# Patient Record
Sex: Female | Born: 1975 | Race: White | Hispanic: No | Marital: Single | Smoking: Current every day smoker
Health system: Southern US, Community
[De-identification: ages and names within clinical notes are randomized; demographics above are authoritative.]

## PROBLEM LIST (undated history)

## (undated) DIAGNOSIS — G4730 Sleep apnea, unspecified: Secondary | ICD-10-CM

## (undated) DIAGNOSIS — J45909 Unspecified asthma, uncomplicated: Secondary | ICD-10-CM

## (undated) DIAGNOSIS — R519 Headache, unspecified: Secondary | ICD-10-CM

## (undated) DIAGNOSIS — F329 Major depressive disorder, single episode, unspecified: Secondary | ICD-10-CM

## (undated) DIAGNOSIS — F419 Anxiety disorder, unspecified: Secondary | ICD-10-CM

## (undated) DIAGNOSIS — R51 Headache: Secondary | ICD-10-CM

## (undated) DIAGNOSIS — K219 Gastro-esophageal reflux disease without esophagitis: Secondary | ICD-10-CM

## (undated) DIAGNOSIS — M1990 Unspecified osteoarthritis, unspecified site: Secondary | ICD-10-CM

## (undated) DIAGNOSIS — F32A Depression, unspecified: Secondary | ICD-10-CM

## (undated) DIAGNOSIS — Z972 Presence of dental prosthetic device (complete) (partial): Secondary | ICD-10-CM

## (undated) DIAGNOSIS — F319 Bipolar disorder, unspecified: Secondary | ICD-10-CM

## (undated) DIAGNOSIS — F909 Attention-deficit hyperactivity disorder, unspecified type: Secondary | ICD-10-CM

## (undated) DIAGNOSIS — Z87442 Personal history of urinary calculi: Secondary | ICD-10-CM

## (undated) HISTORY — PX: TUBAL LIGATION: SHX77

## (undated) HISTORY — DX: Depression, unspecified: F32.A

## (undated) HISTORY — PX: GALLBLADDER SURGERY: SHX652

## (undated) HISTORY — DX: Major depressive disorder, single episode, unspecified: F32.9

## (undated) HISTORY — DX: Attention-deficit hyperactivity disorder, unspecified type: F90.9

## (undated) HISTORY — DX: Bipolar disorder, unspecified: F31.9

## (undated) HISTORY — PX: CHOLECYSTECTOMY: SHX55

## (undated) HISTORY — DX: Anxiety disorder, unspecified: F41.9

---

## 2004-05-27 ENCOUNTER — Inpatient Hospital Stay: Payer: Self-pay | Admitting: Internal Medicine

## 2005-08-12 ENCOUNTER — Emergency Department: Payer: Self-pay | Admitting: Emergency Medicine

## 2005-10-09 ENCOUNTER — Ambulatory Visit: Payer: Self-pay | Admitting: Family Medicine

## 2005-10-17 ENCOUNTER — Ambulatory Visit: Payer: Self-pay | Admitting: Urology

## 2006-12-16 ENCOUNTER — Emergency Department: Payer: Self-pay | Admitting: Emergency Medicine

## 2007-05-07 ENCOUNTER — Ambulatory Visit: Payer: Self-pay | Admitting: Family Medicine

## 2007-08-02 ENCOUNTER — Emergency Department: Payer: Self-pay | Admitting: Emergency Medicine

## 2007-08-02 ENCOUNTER — Other Ambulatory Visit: Payer: Self-pay

## 2007-08-06 ENCOUNTER — Inpatient Hospital Stay: Payer: Self-pay | Admitting: Surgery

## 2008-03-12 ENCOUNTER — Emergency Department: Payer: Self-pay | Admitting: Internal Medicine

## 2008-06-26 DIAGNOSIS — Z720 Tobacco use: Secondary | ICD-10-CM | POA: Insufficient documentation

## 2008-07-14 DIAGNOSIS — G5600 Carpal tunnel syndrome, unspecified upper limb: Secondary | ICD-10-CM | POA: Insufficient documentation

## 2009-04-25 ENCOUNTER — Ambulatory Visit: Payer: Self-pay | Admitting: Family Medicine

## 2009-05-23 ENCOUNTER — Emergency Department: Payer: Self-pay | Admitting: Emergency Medicine

## 2009-08-14 ENCOUNTER — Ambulatory Visit: Payer: Self-pay | Admitting: Family Medicine

## 2009-10-19 ENCOUNTER — Emergency Department: Payer: Self-pay | Admitting: Emergency Medicine

## 2010-06-26 ENCOUNTER — Emergency Department: Payer: Self-pay | Admitting: Emergency Medicine

## 2011-01-17 ENCOUNTER — Emergency Department: Payer: Self-pay | Admitting: Emergency Medicine

## 2011-01-21 ENCOUNTER — Emergency Department: Payer: Self-pay | Admitting: Emergency Medicine

## 2011-11-26 ENCOUNTER — Ambulatory Visit: Payer: Self-pay | Admitting: Family Medicine

## 2012-01-21 ENCOUNTER — Ambulatory Visit: Payer: Self-pay | Admitting: Family Medicine

## 2012-09-11 ENCOUNTER — Emergency Department: Payer: Self-pay | Admitting: Emergency Medicine

## 2012-09-11 LAB — URINALYSIS, COMPLETE
Bilirubin,UR: NEGATIVE
Glucose,UR: NEGATIVE mg/dL (ref 0–75)
Ketone: NEGATIVE
Nitrite: NEGATIVE
Ph: 5 (ref 4.5–8.0)
Protein: NEGATIVE
RBC,UR: 14 /HPF (ref 0–5)
Specific Gravity: 1.018 (ref 1.003–1.030)
Squamous Epithelial: 1
WBC UR: 18 /HPF (ref 0–5)

## 2012-09-11 LAB — CK TOTAL AND CKMB (NOT AT ARMC)
CK, Total: 117 U/L (ref 21–215)
CK-MB: <0.5 ng/mL — ABNORMAL LOW (ref 0.5–3.6)

## 2012-09-11 LAB — CBC
HCT: 43.6 % (ref 35.0–47.0)
HGB: 14.3 g/dL (ref 12.0–16.0)
MCH: 27.6 pg (ref 26.0–34.0)
MCHC: 32.8 g/dL (ref 32.0–36.0)
MCV: 84 fL (ref 80–100)
Platelet: 207 10*3/uL (ref 150–440)
RBC: 5.18 10*6/uL (ref 3.80–5.20)
RDW: 13.4 % (ref 11.5–14.5)
WBC: 16.7 10*3/uL — ABNORMAL HIGH (ref 3.6–11.0)

## 2012-09-11 LAB — BASIC METABOLIC PANEL
Anion Gap: 10 (ref 7–16)
BUN: 9 mg/dL (ref 7–18)
Calcium, Total: 9 mg/dL (ref 8.5–10.1)
Chloride: 103 mmol/L (ref 98–107)
Co2: 24 mmol/L (ref 21–32)
Creatinine: 1 mg/dL (ref 0.60–1.30)
EGFR (African American): >60
EGFR (Non-African Amer.): >60
Glucose: 112 mg/dL — ABNORMAL HIGH (ref 65–99)
Osmolality: 273 (ref 275–301)
Potassium: 4 mmol/L (ref 3.5–5.1)
Sodium: 137 mmol/L (ref 136–145)

## 2012-09-11 LAB — WET PREP, GENITAL

## 2012-09-11 LAB — TROPONIN I: Troponin-I: <0.02 ng/mL

## 2012-09-11 LAB — GC/CHLAMYDIA PROBE AMP

## 2012-09-11 LAB — PREGNANCY, URINE: Pregnancy Test, Urine: NEGATIVE m[IU]/mL

## 2012-09-13 LAB — URINE CULTURE

## 2013-05-28 ENCOUNTER — Emergency Department: Payer: Self-pay | Admitting: Emergency Medicine

## 2013-05-28 LAB — COMPREHENSIVE METABOLIC PANEL
Albumin: 3.1 g/dL — ABNORMAL LOW (ref 3.4–5.0)
Alkaline Phosphatase: 68 U/L
Anion Gap: 4 — ABNORMAL LOW (ref 7–16)
BUN: 12 mg/dL (ref 7–18)
Bilirubin,Total: 0.6 mg/dL (ref 0.2–1.0)
Calcium, Total: 8.7 mg/dL (ref 8.5–10.1)
Chloride: 106 mmol/L (ref 98–107)
Co2: 28 mmol/L (ref 21–32)
Creatinine: 1.06 mg/dL (ref 0.60–1.30)
EGFR (African American): >60
EGFR (Non-African Amer.): >60
Glucose: 93 mg/dL (ref 65–99)
Osmolality: 275 (ref 275–301)
Potassium: 3.8 mmol/L (ref 3.5–5.1)
SGOT(AST): 13 U/L — ABNORMAL LOW (ref 15–37)
SGPT (ALT): 16 U/L (ref 12–78)
Sodium: 138 mmol/L (ref 136–145)
Total Protein: 6.5 g/dL (ref 6.4–8.2)

## 2013-05-28 LAB — CK TOTAL AND CKMB (NOT AT ARMC)
CK, Total: 82 U/L
CK-MB: 0.6 ng/mL (ref 0.5–3.6)

## 2013-05-28 LAB — CBC
HCT: 40.6 % (ref 35.0–47.0)
HGB: 13.5 g/dL (ref 12.0–16.0)
MCH: 28.4 pg (ref 26.0–34.0)
MCHC: 33.2 g/dL (ref 32.0–36.0)
MCV: 86 fL (ref 80–100)
Platelet: 180 10*3/uL (ref 150–440)
RBC: 4.73 10*6/uL (ref 3.80–5.20)
RDW: 13.9 % (ref 11.5–14.5)
WBC: 12.4 10*3/uL — ABNORMAL HIGH (ref 3.6–11.0)

## 2013-05-28 LAB — URINALYSIS, COMPLETE
Bilirubin,UR: NEGATIVE
Glucose,UR: NEGATIVE mg/dL (ref 0–75)
Ketone: NEGATIVE
Nitrite: POSITIVE
Ph: 6 (ref 4.5–8.0)
Protein: NEGATIVE
RBC,UR: 8 /HPF (ref 0–5)
Specific Gravity: 1.015 (ref 1.003–1.030)
Squamous Epithelial: 1
WBC UR: 14 /HPF (ref 0–5)

## 2013-05-28 LAB — TROPONIN I
Troponin-I: <0.02 ng/mL
Troponin-I: <0.02 ng/mL

## 2013-05-28 LAB — D-DIMER(ARMC): D-Dimer: 964 ng/ml

## 2013-10-25 DIAGNOSIS — E785 Hyperlipidemia, unspecified: Secondary | ICD-10-CM | POA: Insufficient documentation

## 2013-10-25 DIAGNOSIS — R079 Chest pain, unspecified: Secondary | ICD-10-CM | POA: Insufficient documentation

## 2014-03-01 ENCOUNTER — Ambulatory Visit: Payer: Self-pay

## 2014-08-23 ENCOUNTER — Encounter: Payer: Self-pay | Admitting: Psychiatry

## 2014-08-23 ENCOUNTER — Ambulatory Visit (INDEPENDENT_AMBULATORY_CARE_PROVIDER_SITE_OTHER): Payer: Medicaid Other | Admitting: Psychiatry

## 2014-08-23 VITALS — BP 96/61 | HR 76 | Resp 16

## 2014-08-23 DIAGNOSIS — F411 Generalized anxiety disorder: Secondary | ICD-10-CM | POA: Diagnosis not present

## 2014-08-23 DIAGNOSIS — F3160 Bipolar disorder, current episode mixed, unspecified: Secondary | ICD-10-CM

## 2014-08-23 MED ORDER — FLUOXETINE HCL 40 MG PO CAPS: 40.0000 mg | ORAL_CAPSULE | Freq: Every day | ORAL | Status: DC

## 2014-08-23 MED ORDER — LITHIUM CARBONATE ER 300 MG PO TBCR: 300.0000 mg | EXTENDED_RELEASE_TABLET | Freq: Two times a day (BID) | ORAL | Status: DC

## 2014-08-23 MED ORDER — CLONAZEPAM 0.5 MG PO TABS: 0.5000 mg | ORAL_TABLET | Freq: Two times a day (BID) | ORAL | Status: DC | PRN
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
## 2014-08-28 ENCOUNTER — Ambulatory Visit (INDEPENDENT_AMBULATORY_CARE_PROVIDER_SITE_OTHER): Payer: Medicaid Other | Admitting: Family Medicine

## 2014-08-28 ENCOUNTER — Encounter: Payer: Self-pay | Admitting: Family Medicine

## 2014-08-28 VITALS — BP 110/60 | HR 72 | Temp 97.4°F | Resp 16 | Wt 253.6 lb

## 2014-08-28 DIAGNOSIS — M7712 Lateral epicondylitis, left elbow: Secondary | ICD-10-CM

## 2014-08-28 DIAGNOSIS — M722 Plantar fascial fibromatosis: Secondary | ICD-10-CM | POA: Diagnosis not present

## 2014-08-28 MED ORDER — MELOXICAM 15 MG PO TABS: 15.0000 mg | ORAL_TABLET | Freq: Every day | ORAL | Status: DC

## 2014-08-28 NOTE — Patient Instructions (Signed)
We will send you to a foot doctor after you have seen the orthopedic doctor for your elbow,

## 2014-08-28 NOTE — Progress Notes (Signed)
Subjective:     Patient ID: Ann Morales, female   DOB: 1976/02/20, 39 y.o.   MRN: [id number]  HPI  Chief Complaint  Patient presents with  . Foot Swelling  . 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 Feet swell during the day but has persistent foot pain over the last several moths with weight bearing over her heels. Right lateral elbow pain started a month ago but worsened in the last two weeks. Now radiates up her arm. She is right handed with no specific injury reported.   Review of Systems  Musculoskeletal:       Reports she was wearing sneakers at work but switched to padded flip-flops for more support.        Objective:   Physical Exam  Musculoskeletal: She exhibits tenderness (over bilateral plantar heels/ right lateral epicondyle to distal upper arm.).       Assessment:     1. Epicondylitis, lateral (tennis elbow), left  - Ambulatory referral to Orthopedic Surgery - meloxicam (MOBIC) 15 MG tablet; Take 1 tablet (15 mg total) by mouth daily.  Dispense: 30 tablet; Refill: 0  Bilateral plantar fasciitis  - meloxicam (MOBIC) 15 MG tablet; Take 1 tablet (15 mg total) by mouth daily.  Dispense: 30 tablet; Refill: 0    Plan:    Podiatry referral later if feet not improved.

## 2014-08-31 ENCOUNTER — Ambulatory Visit: Payer: Self-pay | Admitting: Psychiatry

## 2014-09-25 ENCOUNTER — Ambulatory Visit (INDEPENDENT_AMBULATORY_CARE_PROVIDER_SITE_OTHER): Payer: Medicaid Other | Admitting: Psychiatry

## 2014-09-25 ENCOUNTER — Encounter: Payer: Self-pay | Admitting: Psychiatry

## 2014-09-25 VITALS — BP 124/84 | HR 82 | Temp 97.3°F | Ht 64.0 in | Wt 250.6 lb

## 2014-09-25 DIAGNOSIS — F3160 Bipolar disorder, current episode mixed, unspecified: Secondary | ICD-10-CM | POA: Diagnosis not present

## 2014-09-25 DIAGNOSIS — F411 Generalized anxiety disorder: Secondary | ICD-10-CM

## 2014-09-25 MED ORDER — FLUOXETINE HCL 40 MG PO CAPS: 40.0000 mg | ORAL_CAPSULE | Freq: Every day | ORAL | Status: DC

## 2014-09-25 MED ORDER — LITHIUM CARBONATE ER 300 MG PO TBCR: 600.0000 mg | EXTENDED_RELEASE_TABLET | ORAL | Status: DC

## 2014-09-25 MED ORDER — CLONAZEPAM 0.5 MG PO TABS: 0.5000 mg | ORAL_TABLET | Freq: Two times a day (BID) | ORAL | Status: DC | PRN

## 2014-09-25 NOTE — Progress Notes (Signed)
BH MD/PA/NP OP Progress Note  09/25/2014 8:56 AM Ann Morales  MRN:  [id number]  Subjective:  Patient returns for follow-up of her bipolar disorder. She reports that he feels her medications have helped her mood and keep it stable. She states she's sleeping about 5-5-1/2 hours a night. She states the focus in her life right now is working. She states she and her fianc both work for the same company and ideally they're trying to save to be able to move. She denies any side effects or medications. When asked if she is enjoying anything she states they're largely spending a lot of time working because the company they work for is short staffed at the moment. As such she states they have not been spending much time doing any recreational activities.  She indicated she is misplaced the laboratory slip that I gave her to monitor lithium level. She stated that it's difficult for her to get to a lab during the day as she works till 5 or 6. I did call the outpatient lab services at this facility and they're open to 8:30 PM. I communicated that the patient and the need to get these laboratory studies done. Chief Complaint:  Visit Diagnosis:  No diagnosis found.  Past Medical History:  Past Medical History  Diagnosis Date  . Anxiety   . Depression   . Bipolar disorder   . ADHD (attention deficit hyperactivity disorder)     Past Surgical History  Procedure Laterality Date  . Gallbladder surgery    . Tubal ligation     Family History:  Family History  Problem Relation Age of Onset  . Hypercholesterolemia Father   . Hypercholesterolemia Maternal Grandmother   . Hypertension Maternal Grandmother   . Anxiety disorder Maternal Grandmother   . Depression Maternal Grandmother    Social History:  History   Social History  . Marital Status: Single    Spouse Name: N/A  . Number of Children: N/A  . Years of Education: N/A   Social History Main Topics  . Smoking status: Current Every Day  Smoker -- 2.00 packs/day for 15 years    Types: Cigarettes    Start date: 09/25/1994  . Smokeless tobacco: Never Used  . Alcohol Use: No  . Drug Use: No  . Sexual Activity: Yes    Birth Control/ Protection: None   Other Topics Concern  . None   Social History Narrative   Additional History:   Assessment:   Musculoskeletal: Strength & Muscle Tone: within normal limits Gait & Station: normal Patient leans: N/A  Psychiatric Specialty Exam: HPI  Review of Systems  Psychiatric/Behavioral: Negative for depression, suicidal ideas, hallucinations, memory loss and substance abuse. The patient is not nervous/anxious and does not have insomnia.     Blood pressure 124/84, pulse 82, temperature 97.3 F (36.3 C), temperature source Tympanic, height 5\' 4"  (1.626 m), weight 250 lb 9.6 oz (113.671 kg), last menstrual period 09/15/2014, SpO2 95 %.Body mass index is 42.99 kg/(m^2).  General Appearance: Well Groomed  Eye Contact:  Good  Speech:  Normal Rate  Volume:  Normal  Mood:  Good  Affect:  Congruent  Thought Process:  Linear and Logical  Orientation:  Full (Time, Place, and Person)  Thought Content:  Negative  Suicidal Thoughts:  No  Homicidal Thoughts:  No  Memory:  Immediate;   Good Recent;   Good Remote;   Good  Judgement:  Good  Insight:  Good  Psychomotor Activity:  Negative  Concentration:  Good  Recall:  Good  Fund of Knowledge: Good  Language: Good  Akathisia:  Negative  Handed:  Right  AIMS (if indicated): N/A  Assets:  Desire for Improvement Social Support Vocational/Educational  ADL's:  Intact  Cognition: WNL  Sleep: fair   Is the patient at risk to self?  No. Has the patient been a risk to self in the past 6 months?  No. Has the patient been a risk to self within the distant past?  No. Is the patient a risk to others?  No. Has the patient been a risk to others in the past 6 months?  No. Has the patient been a risk to others within the distant past?   No.  Current Medications: Current Outpatient Prescriptions  Medication Sig Dispense Refill  . clonazePAM (KLONOPIN) 0.5 MG tablet Take 1 tablet (0.5 mg total) by mouth 2 (two) times daily as needed for anxiety. 60 tablet 1  . FLUoxetine (PROZAC) 40 MG capsule Take 1 capsule (40 mg total) by mouth daily. 30 capsule 1  . fluticasone (FLONASE) 50 MCG/ACT nasal spray SPRAY TWICE IN EACH NOSTRIL EVERY DAY  6  . lithium carbonate (LITHOBID) 300 MG CR tablet Take 1 tablet (300 mg total) by mouth 2 (two) times daily. 60 tablet 1  . loratadine (CLARITIN) 10 MG tablet Take 10 mg by mouth daily.  12  . meloxicam (MOBIC) 15 MG tablet Take 1 tablet (15 mg total) by mouth daily. 30 tablet 0  . predniSONE (STERAPRED UNI-PAK 48 TAB) 10 MG (48) TBPK tablet See admin instructions.  0   No current facility-administered medications for this visit.    Medical Decision Making:  Established Problem, Stable/Improving (1), Review or order medicine tests (1) and Review of Medication Regimen & Side Effects (2)  Treatment Plan Summary:Medication management and Plan The patient reports stability on the current regimen. Thus we'll continue her with the clonazepam to 0.5 mg twice daily, fluoxetine 40 mg daily and lithium carbonate which was prescribed 200 mg twice a day however patient said she's taking 600 mg in the morning. Have encouraged patient have laboratory studies and made her aware of the issues that we need to monitor such as renal function and thyroid function. She'll follow up in 6 weeks. I've encouraged patient to obtain labs as soon as possible. will order 30 day supply of her medications with one refill.   [name] 09/25/2014, 8:56 AM

## 2014-11-06 ENCOUNTER — Ambulatory Visit: Payer: Medicaid Other | Admitting: Psychiatry

## 2015-01-02 ENCOUNTER — Other Ambulatory Visit: Payer: Self-pay

## 2015-01-02 ENCOUNTER — Encounter: Payer: Self-pay | Admitting: Family Medicine

## 2015-01-02 ENCOUNTER — Ambulatory Visit (INDEPENDENT_AMBULATORY_CARE_PROVIDER_SITE_OTHER): Payer: Medicaid Other | Admitting: Family Medicine

## 2015-01-02 VITALS — BP 102/64 | HR 74 | Temp 98.5°F | Resp 18 | Wt 264.6 lb

## 2015-01-02 DIAGNOSIS — R062 Wheezing: Secondary | ICD-10-CM

## 2015-01-02 DIAGNOSIS — J309 Allergic rhinitis, unspecified: Secondary | ICD-10-CM

## 2015-01-02 DIAGNOSIS — J069 Acute upper respiratory infection, unspecified: Secondary | ICD-10-CM | POA: Diagnosis not present

## 2015-01-02 MED ORDER — ALBUTEROL SULFATE HFA 108 (90 BASE) MCG/ACT IN AERS: 2.0000 | INHALATION_SPRAY | Freq: Four times a day (QID) | RESPIRATORY_TRACT | Status: DC | PRN

## 2015-01-02 MED ORDER — BENZONATATE 200 MG PO CAPS: 200.0000 mg | ORAL_CAPSULE | Freq: Two times a day (BID) | ORAL | Status: DC | PRN

## 2015-01-02 NOTE — Patient Instructions (Signed)
Upper Respiratory Infection, Adult Most upper respiratory infections (URIs) are a viral infection of the air passages leading to the lungs. A URI affects the nose, throat, and upper air passages. The most common type of URI is nasopharyngitis and is typically referred to as "the common cold." URIs run their course and usually go away on their own. Most of the time, a URI does not require medical attention, but sometimes a bacterial infection in the upper airways can follow a viral infection. This is called a secondary infection. Sinus and middle ear infections are common types of secondary upper respiratory infections. Bacterial pneumonia can also complicate a URI. A URI can worsen asthma and chronic obstructive pulmonary disease (COPD). Sometimes, these complications can require emergency medical care and may be life threatening.  CAUSES Almost all URIs are caused by viruses. A virus is a type of germ and can spread from one person to another.  RISKS FACTORS You may be at risk for a URI if:   You smoke.   You have chronic heart or lung disease.  You have a weakened defense (immune) system.   You are very young or very old.   You have nasal allergies or asthma.  You work in crowded or poorly ventilated areas.  You work in health care facilities or schools. SIGNS AND SYMPTOMS  Symptoms typically develop 2-3 days after you come in contact with a cold virus. Most viral URIs last 7-10 days. However, viral URIs from the influenza virus (flu virus) can last 14-18 days and are typically more severe. Symptoms may include:   Runny or stuffy (congested) nose.   Sneezing.   Cough.   Sore throat.   Headache.   Fatigue.   Fever.   Loss of appetite.   Pain in your forehead, behind your eyes, and over your cheekbones (sinus pain).  Muscle aches.  DIAGNOSIS  Your health care provider may diagnose a URI by:  Physical exam.  Tests to check that your symptoms are not due to  another condition such as:  Strep throat.  Sinusitis.  Pneumonia.  Asthma. TREATMENT  A URI goes away on its own with time. It cannot be cured with medicines, but medicines may be prescribed or recommended to relieve symptoms. Medicines may help:  Reduce your fever.  Reduce your cough.  Relieve nasal congestion. HOME CARE INSTRUCTIONS   Take medicines only as directed by your health care provider.   Gargle warm saltwater or take cough drops to comfort your throat as directed by your health care provider.  Use a warm mist humidifier or inhale steam from a shower to increase air moisture. This may make it easier to breathe.  Drink enough fluid to keep your urine clear or pale yellow.   Eat soups and other clear broths and maintain good nutrition.   Rest as needed.   Return to work when your temperature has returned to normal or as your health care provider advises. You may need to stay home longer to avoid infecting others. You can also use a face mask and careful hand washing to prevent spread of the virus.  Increase the usage of your inhaler if you have asthma.   Do not use any tobacco products, including cigarettes, chewing tobacco, or electronic cigarettes. If you need help quitting, ask your health care provider. PREVENTION  The best way to protect yourself from getting a cold is to practice good hygiene.   Avoid oral or hand contact with people with cold   symptoms.   Wash your hands often if contact occurs.  There is no clear evidence that vitamin C, vitamin E, echinacea, or exercise reduces the chance of developing a cold. However, it is always recommended to get plenty of rest, exercise, and practice good nutrition.  SEEK MEDICAL CARE IF:   You are getting worse rather than better.   Your symptoms are not controlled by medicine.   You have chills.  You have worsening shortness of breath.  You have brown or red mucus.  You have yellow or brown nasal  discharge.  You have pain in your face, especially when you bend forward.  You have a fever.  You have swollen neck glands.  You have pain while swallowing.  You have white areas in the back of your throat. SEEK IMMEDIATE MEDICAL CARE IF:   You have severe or persistent:  Headache.  Ear pain.  Sinus pain.  Chest pain.  You have chronic lung disease and any of the following:  Wheezing.  Prolonged cough.  Coughing up blood.  A change in your usual mucus.  You have a stiff neck.  You have changes in your:  Vision.  Hearing.  Thinking.  Mood. MAKE SURE YOU:   Understand these instructions.  Will watch your condition.  Will get help right away if you are not doing well or get worse.   This information is not intended to replace advice given to you by your health care provider. Make sure you discuss any questions you have with your health care provider.   Document Released: 08/20/2000 Document Revised: 07/11/2014 Document Reviewed: 06/01/2013 Elsevier Interactive Patient Education 2016 [employer]  

## 2015-01-02 NOTE — Progress Notes (Signed)
Patient ID: Ann Morales, female   DOB: 1975-10-08, 39 y.o.   MRN: [id number] Name: Ann Morales   MRN: [id number]    DOB: Feb 11, 1976   Date:01/02/2015       Progress Note  Subjective  Chief Complaint  Chief Complaint  Patient presents with  . URI    URI  This is a new problem. The current episode started 1 to 4 weeks ago. The problem has been gradually worsening. Associated symptoms include chest pain, congestion, coughing, rhinorrhea and a sore throat. Associated symptoms comments: Itchy nose at onset 2 weeks ago. Now having a light yellow sputum with wheeze at night.. She has tried decongestant and antihistamine (Still using Flonase and Claritin) for the symptoms. The treatment provided no relief.   Past Surgical History  Procedure Laterality Date  . Gallbladder surgery    . Tubal ligation     Family History  Problem Relation Age of Onset  . Hypercholesterolemia Father   . Hypercholesterolemia Maternal Grandmother   . Hypertension Maternal Grandmother   . Anxiety disorder Maternal Grandmother   . Depression Maternal Grandmother    Past Medical History  Diagnosis Date  . Anxiety   . Depression   . Bipolar disorder ([place])   . ADHD (attention deficit hyperactivity disorder)    Social History  Substance Use Topics  . Smoking status: Current Every Day Smoker -- 2.00 packs/day for 15 years    Types: Cigarettes    Start date: 09/25/1994  . Smokeless tobacco: Never Used  . Alcohol Use: No    Current outpatient prescriptions:  .  clonazePAM (KLONOPIN) 0.5 MG tablet, Take 1 tablet (0.5 mg total) by mouth 2 (two) times daily as needed for anxiety., Disp: 60 tablet, Rfl: 1 .  FLUoxetine (PROZAC) 40 MG capsule, Take 1 capsule (40 mg total) by mouth daily., Disp: 30 capsule, Rfl: 1 .  fluticasone (FLONASE) 50 MCG/ACT nasal spray, SPRAY TWICE IN EACH NOSTRIL EVERY DAY, Disp: , Rfl: 6 .  lithium carbonate (LITHOBID) 300 MG CR tablet, Take 2 tablets (600 mg total) by mouth  every morning., Disp: 60 tablet, Rfl: 1 .  loratadine (CLARITIN) 10 MG tablet, Take 10 mg by mouth daily., Disp: , Rfl: 12 .  meloxicam (MOBIC) 15 MG tablet, Take 1 tablet (15 mg total) by mouth daily., Disp: 30 tablet, Rfl: 0 .  omeprazole (PRILOSEC) 20 MG capsule, Take 20 mg by mouth daily., Disp: , Rfl: 0  Allergies  Allergen Reactions  . Codeine Hives and Shortness Of Breath  . Sulfa Antibiotics Other (See Comments)    unknown    Review of Systems  Constitutional: Negative.   HENT: Positive for congestion, rhinorrhea and sore throat.   Eyes: Negative.   Respiratory: Positive for cough.   Cardiovascular: Positive for chest pain.  Gastrointestinal: Negative.   Genitourinary: Negative.   Musculoskeletal: Negative.   Skin: Negative.   Neurological: Negative.   Endo/Heme/Allergies: Negative.   Psychiatric/Behavioral: Negative.    Objective  Filed Vitals:   01/02/15 1540  BP: 102/64  Pulse: 74  Temp: 98.5 F (36.9 C)  TempSrc: Oral  Resp: 18  Weight: 264 lb 9.6 oz (120.022 kg)  SpO2: 97%   Physical Exam  Constitutional: She is oriented to person, place, and time and well-developed, well-nourished, and in no distress.  HENT:  Head: Normocephalic and atraumatic.  Right Ear: External ear normal.  Left Ear: External ear normal.  Nose: Nose normal.  Slightly cobblestone appearance to posterior pharynx. No  exudates. Very good transillumination of all sinuses.  Eyes: Conjunctivae and EOM are normal.  Neck: Normal range of motion. Neck supple.  Cardiovascular: Normal rate, regular rhythm and normal heart sounds.   Pulmonary/Chest: Effort normal. No respiratory distress. She has wheezes. She has no rales. She exhibits no tenderness.  Abdominal: Soft. Bowel sounds are normal.  Lymphadenopathy:    She has no cervical adenopathy.  Neurological: She is alert and oriented to person, place, and time.  Skin: No rash noted.   Assessment & Plan  1. Upper respiratory  infection Onset with allergic rhinitis 2 weeks ago. No fever documented but has felt some chills. No significant help with cough by using the Mucinex-DM. Will give Tessalon and increase fluid intake. Suspect viral illness. May use Tylenol or Advil prn. Recheck if no better in 5 days. - benzonatate (TESSALON) 200 MG capsule; Take 1 capsule (200 mg total) by mouth 2 (two) times daily as needed for cough.  Dispense: 20 capsule; Refill: 0  2. Wheeze Some early inspiratory wheeze. Clears with continued deep breath. No rales or rhonchi. Will give Albuterol inhaler to use prn rescue. Recheck prn. - albuterol (PROVENTIL HFA;VENTOLIN HFA) 108 (90 BASE) MCG/ACT inhaler; Inhale 2 puffs into the lungs every 6 (six) hours as needed for wheezing or shortness of breath.  Dispense: 1 Inhaler; Refill: 1  3. Allergic rhinitis, unspecified allergic rhinitis type Continue Claritin and Flonase regularly for itchy nose and rhinorrhea. Recheck prn.

## 2015-01-08 ENCOUNTER — Encounter: Payer: Self-pay | Admitting: Family Medicine

## 2015-01-15 ENCOUNTER — Encounter: Payer: Self-pay | Admitting: Family Medicine

## 2015-01-15 ENCOUNTER — Ambulatory Visit (INDEPENDENT_AMBULATORY_CARE_PROVIDER_SITE_OTHER): Payer: Medicaid Other | Admitting: Family Medicine

## 2015-01-15 VITALS — BP 112/62 | HR 82 | Temp 98.5°F | Resp 16 | Ht 65.5 in | Wt 267.2 lb

## 2015-01-15 DIAGNOSIS — M940 Chondrocostal junction syndrome [Tietze]: Secondary | ICD-10-CM | POA: Diagnosis not present

## 2015-01-15 DIAGNOSIS — J0100 Acute maxillary sinusitis, unspecified: Secondary | ICD-10-CM

## 2015-01-15 DIAGNOSIS — J45909 Unspecified asthma, uncomplicated: Secondary | ICD-10-CM | POA: Insufficient documentation

## 2015-01-15 DIAGNOSIS — I8393 Asymptomatic varicose veins of bilateral lower extremities: Secondary | ICD-10-CM | POA: Diagnosis not present

## 2015-01-15 DIAGNOSIS — I809 Phlebitis and thrombophlebitis of unspecified site: Secondary | ICD-10-CM | POA: Insufficient documentation

## 2015-01-15 DIAGNOSIS — J309 Allergic rhinitis, unspecified: Secondary | ICD-10-CM | POA: Insufficient documentation

## 2015-01-15 DIAGNOSIS — F32A Depression, unspecified: Secondary | ICD-10-CM | POA: Insufficient documentation

## 2015-01-15 DIAGNOSIS — I8390 Asymptomatic varicose veins of unspecified lower extremity: Secondary | ICD-10-CM | POA: Insufficient documentation

## 2015-01-15 DIAGNOSIS — Z87442 Personal history of urinary calculi: Secondary | ICD-10-CM | POA: Insufficient documentation

## 2015-01-15 DIAGNOSIS — F17200 Nicotine dependence, unspecified, uncomplicated: Secondary | ICD-10-CM | POA: Diagnosis not present

## 2015-01-15 DIAGNOSIS — F329 Major depressive disorder, single episode, unspecified: Secondary | ICD-10-CM | POA: Insufficient documentation

## 2015-01-15 DIAGNOSIS — Z8659 Personal history of other mental and behavioral disorders: Secondary | ICD-10-CM | POA: Insufficient documentation

## 2015-01-15 MED ORDER — PREDNISONE 10 MG PO TABS: ORAL_TABLET | ORAL | Status: DC

## 2015-01-15 MED ORDER — VARENICLINE TARTRATE 0.5 MG X 11 & 1 MG X 42 PO MISC: ORAL | Status: DC

## 2015-01-15 MED ORDER — AMOXICILLIN-POT CLAVULANATE 875-125 MG PO TABS: 1.0000 | ORAL_TABLET | Freq: Two times a day (BID) | ORAL | Status: DC

## 2015-01-15 NOTE — Patient Instructions (Signed)
We will call you about the referral. 

## 2015-01-15 NOTE — Progress Notes (Signed)
Subjective:     Patient ID: Ann Morales, female   DOB: 06-14-1975, 39 y.o.   MRN: [id number]  HPI  Chief Complaint  Patient presents with  . Annual Exam    Patient comes in today for her annual physical, patient would like to address at todays visit cold like symptoms she still having since last being seen in office and to discuss reflux. Patient reports she still has pain in chest at times and feels like the Omperazole is not helping. Patient is due for her annual pap today, her last recorded Tdap 06/12/10 patient has declined flu vaccine today  Ostensibly here for a physical but has multiple medical issues and we mutually elected to treat this as an office visit. Since last visit for a URI  10/25, patient reports increased sinus pressure, purulent sinus drainage, post nasal drainage and accompanying cough. Has persistent chest pain but no burning. Reports being evaluated for this at [facility] a few weeks ago and placed on omeprazole. States burning is gone but continues to have chest pain. Previously quit smoking for 3 months with Chantix and wishes to resume. Last, wishes referral back to vascular surgery for a painful left lower extremity varicose vein.   Review of Systems  Constitutional: Positive for unexpected weight change (14 # weight gain since June. Suggested it is probably related to her psychiatric medication but will assess futher at physical exam in the furture.[name] Kitchen).  Psychiatric/Behavioral:       Continues to be followed by Dr. Jimmye Norman for Bipolar disorder       Objective:   Physical Exam  Constitutional: She appears well-developed and well-nourished. No distress.  Cardiovascular:  Large tender left calf varicose vein  Pulmonary/Chest: She exhibits tenderness (bilateral mdi- costochondral area).  Abdominal: Soft. There is no tenderness.  Ears: T.M's intact without inflammation Sinuses: maxillary sinuses moderately tender Throat: no tonsillar enlargement or exudate Neck:  no cervical adenopathy Lungs: clear     Assessment:    1. Acute maxillary sinusitis, recurrence not specified - amoxicillin-clavulanate (AUGMENTIN) 875-125 MG tablet; Take 1 tablet by mouth 2 (two) times daily.  Dispense: 20 tablet; Refill: 0  2. Costochondritis, acute - predniSONE (DELTASONE) 10 MG tablet; Taper daily as follows: 6 pills, 5, 4, 3, 2, 1  Dispense: 21 tablet; Refill: 0  3. Tobacco use disorder - varenicline (CHANTIX STARTING MONTH PAK) 0.5 MG X 11 & 1 MG X 42 tablet; Take one 0.5 mg tablet by mouth once daily for 3 days, then increase to one 0.5 mg tablet twice daily for 4 days, then increase to one 1 mg tablet twice daily.  Dispense: 53 tablet; Refill: 0  4. Varicose vein of leg - Ambulatory referral to Vascular Surgery    Plan:    Delay Chantix until abx completed.

## 2015-01-22 ENCOUNTER — Telehealth: Payer: Self-pay | Admitting: Family Medicine

## 2015-01-22 ENCOUNTER — Other Ambulatory Visit: Payer: Self-pay | Admitting: Family Medicine

## 2015-01-22 DIAGNOSIS — K219 Gastro-esophageal reflux disease without esophagitis: Secondary | ICD-10-CM

## 2015-01-22 MED ORDER — OMEPRAZOLE 20 MG PO CPDR: 20.0000 mg | DELAYED_RELEASE_CAPSULE | Freq: Every day | ORAL | Status: DC

## 2015-01-22 NOTE — Telephone Encounter (Signed)
refill 

## 2015-01-22 NOTE — Telephone Encounter (Signed)
Pt states she need a refill on her omeprazole (PRILOSEC) 20 MG capsule @ [facility]. CC

## 2015-01-29 ENCOUNTER — Ambulatory Visit: Payer: Self-pay | Admitting: Family Medicine

## 2015-02-10 ENCOUNTER — Other Ambulatory Visit: Payer: Self-pay | Admitting: Family Medicine

## 2015-02-12 NOTE — Telephone Encounter (Signed)
I have previously prescribed the Chantix starter pack one month ago. See if it is the continuation pack she needs now.

## 2015-02-13 NOTE — Telephone Encounter (Signed)
Patient states to disregard refill request, she states that you had told her last office visit to hold off on starting Chantix till she followed back up. Patient states that she is unable to make appt but will be starting the starter pak soon. KW

## 2015-02-19 ENCOUNTER — Encounter: Payer: Self-pay | Admitting: Family Medicine

## 2015-02-19 ENCOUNTER — Ambulatory Visit (INDEPENDENT_AMBULATORY_CARE_PROVIDER_SITE_OTHER): Payer: Medicaid Other | Admitting: Family Medicine

## 2015-02-19 VITALS — BP 90/62 | HR 74 | Temp 98.4°F | Resp 16 | Wt 269.6 lb

## 2015-02-19 DIAGNOSIS — J0100 Acute maxillary sinusitis, unspecified: Secondary | ICD-10-CM

## 2015-02-19 MED ORDER — FLUCONAZOLE 150 MG PO TABS: 150.0000 mg | ORAL_TABLET | Freq: Once | ORAL | Status: DC

## 2015-02-19 MED ORDER — LEVOFLOXACIN 500 MG PO TABS: 500.0000 mg | ORAL_TABLET | Freq: Every day | ORAL | Status: DC

## 2015-02-19 NOTE — Patient Instructions (Signed)
Minimize smoking while ill. Let me know if you don't get better.

## 2015-02-19 NOTE — Progress Notes (Signed)
Subjective:     Patient ID: Ann Morales, female   DOB: 1975-10-21, 39 y.o.   MRN: [id number]  HPI  Chief Complaint  Patient presents with  . Sinus Problem    Patient comes in office today with concerns of sinus congestion/pressure and cough for the past 3 weeks. Patient was last seen in office on 01/15/15, diagnosis was acute macillary sinusitis she was placed on Augementin. Patient reports since last visit symptoms never fully cleared. Today patient has concerns of wheezing, productive cough of sputum, post nasal drip and sinus pressuire below eyes.   Continues to have headaches, sinus pressure, and purulent PND.   Review of Systems  Constitutional: Positive for chills. Negative for fever.       Objective:   Physical Exam  Constitutional: She appears well-developed and well-nourished. No distress.  Ears: T.M's intact without inflammation Sinuses: moderate maxillary sinus tenderness Throat: no tonsillar enlargement or exudate Neck: no cervical adenopathy Lungs: clear     Assessment:    1. Acute maxillary sinusitis, recurrence not specified - levofloxacin (LEVAQUIN) 500 MG tablet; Take 1 tablet (500 mg total) by mouth daily.  Dispense: 10 tablet; Refill: 0 - fluconazole (DIFLUCAN) 150 MG tablet; Take 1 tablet (150 mg total) by mouth once.  Dispense: 1 tablet; Refill: 1    Plan:    Minimize smoking while ill.

## 2015-03-07 ENCOUNTER — Telehealth: Payer: Self-pay | Admitting: Family Medicine

## 2015-03-07 NOTE — Telephone Encounter (Signed)
Pt states she has completed the Rx for sinus congestion and not any better.  Pt is requesting a referral to a specialist.  [phone]

## 2015-03-08 ENCOUNTER — Other Ambulatory Visit: Payer: Self-pay | Admitting: Family Medicine

## 2015-03-08 DIAGNOSIS — J0101 Acute recurrent maxillary sinusitis: Secondary | ICD-10-CM

## 2015-03-08 NOTE — Telephone Encounter (Signed)
Patient advised as directed below. 

## 2015-03-08 NOTE — Telephone Encounter (Signed)
Referral in progress. 

## 2015-03-21 ENCOUNTER — Ambulatory Visit: Payer: Medicaid Other | Admitting: Psychiatry

## 2015-03-26 ENCOUNTER — Encounter: Payer: Self-pay | Admitting: Psychiatry

## 2015-03-26 ENCOUNTER — Ambulatory Visit (INDEPENDENT_AMBULATORY_CARE_PROVIDER_SITE_OTHER): Payer: Medicaid Other | Admitting: Psychiatry

## 2015-03-26 VITALS — BP 118/82 | HR 89 | Temp 97.9°F | Ht 65.5 in | Wt 267.8 lb

## 2015-03-26 DIAGNOSIS — F3160 Bipolar disorder, current episode mixed, unspecified: Secondary | ICD-10-CM

## 2015-03-26 MED ORDER — LITHIUM CARBONATE ER 300 MG PO TBCR: 600.0000 mg | EXTENDED_RELEASE_TABLET | ORAL | Status: DC

## 2015-03-26 MED ORDER — CLONAZEPAM 0.5 MG PO TABS: 0.5000 mg | ORAL_TABLET | Freq: Two times a day (BID) | ORAL | Status: DC | PRN

## 2015-03-26 MED ORDER — FLUOXETINE HCL 20 MG PO CAPS: ORAL_CAPSULE | ORAL | Status: DC

## 2015-03-26 NOTE — Patient Instructions (Signed)
Have lithium level done on 03/30/15 8 to 12 hours after last lithium dose.

## 2015-03-26 NOTE — Progress Notes (Signed)
BH MD/PA/NP OP Progress Note  03/26/2015 11:11 AM Ann Morales  MRN:  [id number]  Subjective:  Patient returns for follow-up of her bipolar disorder. As not been to see me since July 2016. She indicates she did something bad. She states she stopped all of her medications sometime around September 2016 and states that in October she developed a recurrence of her anxiety, panic attacks and mood swings. She is able to state that when she was on medication things were stable.  Chief Complaint: I did a bad thing Chief Complaint    Follow-up     Visit Diagnosis:     ICD-9-CM ICD-10-CM   1. Bipolar affective disorder, current episode mixed, without psychotic features, current episode severity unspecified 296.80 F31.60     Past Medical History:  Past Medical History  Diagnosis Date  . Anxiety   . Depression   . Bipolar disorder ([place])   . ADHD (attention deficit hyperactivity disorder)     Past Surgical History  Procedure Laterality Date  . Gallbladder surgery    . Tubal ligation     Family History:  Family History  Problem Relation Age of Onset  . Hypercholesterolemia Father   . Hypercholesterolemia Maternal Grandmother   . Hypertension Maternal Grandmother   . Anxiety disorder Maternal Grandmother   . Depression Maternal Grandmother   . Bipolar disorder Mother    Social History:  Social History   Social History  . Marital Status: Single    Spouse Name: N/A  . Number of Children: N/A  . Years of Education: N/A   Social History Main Topics  . Smoking status: Current Every Day Smoker -- 2.00 packs/day for 15 years    Types: Cigarettes    Start date: 09/25/1994  . Smokeless tobacco: Never Used  . Alcohol Use: No  . Drug Use: No  . Sexual Activity: Yes    Birth Control/ Protection: None   Other Topics Concern  . None   Social History Narrative   Additional History:   Assessment:   Musculoskeletal: Strength & Muscle Tone: within normal limits Gait &  Station: normal Patient leans: N/A  Psychiatric Specialty Exam: HPI  Review of Systems  Psychiatric/Behavioral: Positive for depression. Negative for suicidal ideas, hallucinations, memory loss and substance abuse. The patient is nervous/anxious. The patient does not have insomnia.   All other systems reviewed and are negative.   Blood pressure 118/82, pulse 89, temperature 97.9 F (36.6 C), temperature source Tympanic, height 5' 5.5" (1.664 m), weight 267 lb 12.8 oz (121.473 kg), last menstrual period 03/02/2015, SpO2 94 %.Body mass index is 43.87 kg/(m^2).  General Appearance: Well Groomed  Eye Contact:  Good  Speech:  Normal Rate  Volume:  Normal  Mood:  Good  Affect:  Congruent  Thought Process:  Linear and Logical  Orientation:  Full (Time, Place, and Person)  Thought Content:  Negative  Suicidal Thoughts:  No  Homicidal Thoughts:  No  Memory:  Immediate;   Good Recent;   Good Remote;   Good  Judgement:  Good  Insight:  Good  Psychomotor Activity:  Negative  Concentration:  Good  Recall:  Good  Fund of Knowledge: Good  Language: Good  Akathisia:  Negative  Handed:  Right  AIMS (if indicated): N/A  Assets:  Desire for Improvement Social Support Vocational/Educational  ADL's:  Intact  Cognition: WNL  Sleep: fair   Is the patient at risk to self?  No. Has the patient been a risk to  self in the past 6 months?  No. Has the patient been a risk to self within the distant past?  No. Is the patient a risk to others?  No. Has the patient been a risk to others in the past 6 months?  No. Has the patient been a risk to others within the distant past?  No.  Current Medications: Current Outpatient Prescriptions  Medication Sig Dispense Refill  . clonazePAM (KLONOPIN) 0.5 MG tablet Take 1 tablet (0.5 mg total) by mouth 2 (two) times daily as needed for anxiety. 60 tablet 0  . FLUoxetine (PROZAC) 20 MG capsule Take one capsule in the morning for seven days then increase to  two capsules in the morning. 60 capsule 0  . lithium carbonate (LITHOBID) 300 MG CR tablet Take 2 tablets (600 mg total) by mouth every morning. 60 tablet 0   No current facility-administered medications for this visit.    Medical Decision Making:  Established Problem, Stable/Improving (1), Review or order medicine tests (1) and Review of Medication Regimen & Side Effects (2)  Treatment Plan Summary:Medication management and Plan The patient to meet her medications and had a resurgence of her symptoms. Thus we'll start her previous medication regimen.   Bipolar disorder-restart her clonazepam to 0.5 mg twice daily as needed for anxiety, restart fluoxetine 20 mg daily 7 days and then she'll increase to 40 mg daily which was her previous dose. Restart  lithium carbonate CR 600 mg in the morning. I have encouraged patient have laboratory studies and made her aware of the issues that we need to monitor such as renal function and thyroid function. Agree to get these labs done on 03/30/2015.  She'll follow up in an month. He is aware my departure from the clinic and that she will follow up with another provider in this clinic. She will see me one more time prior to my departure as we are restarting her medications.   [name] 03/26/2015, 11:11 AM

## 2015-03-30 ENCOUNTER — Ambulatory Visit: Payer: Self-pay | Admitting: Family Medicine

## 2015-04-02 ENCOUNTER — Other Ambulatory Visit: Payer: Self-pay | Admitting: Family Medicine

## 2015-04-02 ENCOUNTER — Ambulatory Visit
Admission: RE | Admit: 2015-04-02 | Discharge: 2015-04-02 | Disposition: A | Discharge status: Home / Self Care | Payer: Medicaid Other | Source: Ambulatory Visit | Attending: Family Medicine | Admitting: Family Medicine

## 2015-04-02 ENCOUNTER — Ambulatory Visit (INDEPENDENT_AMBULATORY_CARE_PROVIDER_SITE_OTHER): Payer: Medicaid Other | Admitting: Family Medicine

## 2015-04-02 ENCOUNTER — Encounter: Payer: Self-pay | Admitting: Family Medicine

## 2015-04-02 VITALS — BP 102/78 | HR 70 | Temp 98.1°F | Resp 16 | Wt 267.2 lb

## 2015-04-02 DIAGNOSIS — M25551 Pain in right hip: Secondary | ICD-10-CM

## 2015-04-02 NOTE — Patient Instructions (Signed)
We will call you with the x-ray report. Take Tylenol extra strength for now until I can evaluate the x-ray.
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
## 2015-04-03 ENCOUNTER — Telehealth: Payer: Self-pay

## 2015-04-03 ENCOUNTER — Other Ambulatory Visit: Payer: Self-pay | Admitting: Family Medicine

## 2015-04-03 DIAGNOSIS — M25551 Pain in right hip: Secondary | ICD-10-CM

## 2015-04-03 NOTE — Telephone Encounter (Signed)
LMTCB-KW 

## 2015-04-03 NOTE — Telephone Encounter (Signed)
LMTCB 04/03/2015  Thanks,   -Mickel Baas

## 2015-04-03 NOTE — Telephone Encounter (Signed)
Pt advised, she would like to see someone at [facility].   Thanks,   -Mickel Baas

## 2015-04-03 NOTE — Telephone Encounter (Signed)
-----   Message from Carmon Ginsberg, [place] sent at 04/03/2015  8:31 AM EST ----- No fractures or arthritic changes. I would like you to see an orthopedic doctor. Do you have a preference?

## 2015-04-03 NOTE — Telephone Encounter (Signed)
Referral in progress. Once prednisone completed, try two Aleve twice daily with food while waiting for referral.

## 2015-04-04 NOTE — Telephone Encounter (Signed)
Patient advised as below. sd 

## 2015-04-16 ENCOUNTER — Ambulatory Visit (INDEPENDENT_AMBULATORY_CARE_PROVIDER_SITE_OTHER): Payer: Medicaid Other | Admitting: Psychiatry

## 2015-04-16 ENCOUNTER — Encounter: Payer: Self-pay | Admitting: Psychiatry

## 2015-04-16 VITALS — BP 124/82 | HR 92 | Temp 98.4°F | Ht 65.5 in | Wt 265.8 lb

## 2015-04-16 DIAGNOSIS — F3160 Bipolar disorder, current episode mixed, unspecified: Secondary | ICD-10-CM

## 2015-04-16 DIAGNOSIS — F411 Generalized anxiety disorder: Secondary | ICD-10-CM | POA: Diagnosis not present

## 2015-04-16 MED ORDER — CLONAZEPAM 0.5 MG PO TABS: 0.5000 mg | ORAL_TABLET | Freq: Two times a day (BID) | ORAL | Status: DC | PRN

## 2015-04-16 MED ORDER — LITHIUM CARBONATE ER 300 MG PO TBCR: 600.0000 mg | EXTENDED_RELEASE_TABLET | ORAL | Status: DC

## 2015-04-16 MED ORDER — FLUOXETINE HCL 20 MG PO CAPS
ORAL_CAPSULE | ORAL | Status: DC
Start: 2015-04-16 — End: 2016-04-21

## 2015-04-16 NOTE — Progress Notes (Signed)
BH MD/PA/NP OP Progress Note  04/16/2015 9:30 AM Ann Morales  MRN:  [id number]  Subjective:  Patient returns for follow-up of her bipolar disorder. Patient return to see me in January 2017 after last being seen in July 2016. In January I restarted her on her previous psychiatric medications that she had discontinued them. We restarted her Prozac and her lithium. She states overall her mood is good however she states over the past few days she's had a lot of anxiety. She states the anxiety is related to having to leave her apartment. Patient states she got a dog and was hoping she could find a place for the dog before her apartment management notice. However she was not able to find a place for the dog and the place asked her to leave because they did not allow pets. Patient states she is in the process of trying to find another place to live. She does state that her mood overall has been good and she's been able to enjoy things like shopping with her relatives. She states her sleep has not been good she has felt hot at night over the past few weeks but thinks she might be going through menopause. She states that her mother went through menopause in her 68s.  Patient brought up that she is going to try to apply for disability. I told her given that I'm about to leave it would probably not be good to initiate that paperwork at this time.  Chief Complaint: Anxiety Chief Complaint    Follow-up; Medication Refill     Visit Diagnosis:     ICD-9-CM ICD-10-CM   1. Bipolar affective disorder, current episode mixed, without psychotic features, current episode severity unspecified 296.80 F31.60   2. Anxiety state 300.00 F41.1     Past Medical History:  Past Medical History  Diagnosis Date  . Anxiety   . Depression   . Bipolar disorder ([place])   . ADHD (attention deficit hyperactivity disorder)     Past Surgical History  Procedure Laterality Date  . Gallbladder surgery    . Tubal ligation      Family History:  Family History  Problem Relation Age of Onset  . Hypercholesterolemia Father   . Hypercholesterolemia Maternal Grandmother   . Hypertension Maternal Grandmother   . Anxiety disorder Maternal Grandmother   . Depression Maternal Grandmother   . Bipolar disorder Mother    Social History:  Social History   Social History  . Marital Status: Single    Spouse Name: N/A  . Number of Children: N/A  . Years of Education: N/A   Social History Main Topics  . Smoking status: Current Every Day Smoker -- 2.00 packs/day for 15 years    Types: Cigarettes    Start date: 09/25/1994  . Smokeless tobacco: Never Used  . Alcohol Use: No  . Drug Use: No  . Sexual Activity: Yes    Birth Control/ Protection: None   Other Topics Concern  . None   Social History Narrative   Additional History:   Assessment:   Musculoskeletal: Strength & Muscle Tone: within normal limits Gait & Station: normal Patient leans: N/A  Psychiatric Specialty Exam: HPI  Review of Systems  Psychiatric/Behavioral: Negative for depression, suicidal ideas, hallucinations, memory loss and substance abuse. The patient is nervous/anxious. The patient does not have insomnia.   All other systems reviewed and are negative.   Blood pressure 124/82, pulse 92, temperature 98.4 F (36.9 C), temperature source Tympanic, height 5'  5.5" (1.664 m), weight 265 lb 12.8 oz (120.566 kg), last menstrual period 04/02/2015, SpO2 95 %.Body mass index is 43.54 kg/(m^2).  General Appearance: Well Groomed  Eye Contact:  Good  Speech:  Normal Rate  Volume:  Normal  Mood:  Anxious  Affect:  Congruent  Thought Process:  Linear and Logical  Orientation:  Full (Time, Place, and Person)  Thought Content:  Negative  Suicidal Thoughts:  No  Homicidal Thoughts:  No  Memory:  Immediate;   Good Recent;   Good Remote;   Good  Judgement:  Good  Insight:  Good  Psychomotor Activity:  Negative  Concentration:  Good  Recall:   Good  Fund of Knowledge: Good  Language: Good  Akathisia:  Negative  Handed:  Right  AIMS (if indicated): N/A  Assets:  Desire for Improvement Social Support Vocational/Educational  ADL's:  Intact  Cognition: WNL  Sleep: fair   Is the patient at risk to self?  No. Has the patient been a risk to self in the past 6 months?  No. Has the patient been a risk to self within the distant past?  No. Is the patient a risk to others?  No. Has the patient been a risk to others in the past 6 months?  No. Has the patient been a risk to others within the distant past?  No.  Current Medications: Current Outpatient Prescriptions  Medication Sig Dispense Refill  . clonazePAM (KLONOPIN) 0.5 MG tablet Take 1 tablet (0.5 mg total) by mouth 2 (two) times daily as needed for anxiety. 60 tablet 3  . FLUoxetine (PROZAC) 20 MG capsule Take one capsule in the morning for seven days then increase to two capsules in the morning. 60 capsule 3  . lithium carbonate (LITHOBID) 300 MG CR tablet Take 2 tablets (600 mg total) by mouth every morning. 60 tablet 3   No current facility-administered medications for this visit.    Medical Decision Making:  Established Problem, Stable/Improving (1), Review or order medicine tests (1) and Review of Medication Regimen & Side Effects (2)  Treatment Plan Summary:Medication management and Plan The patient to meet her medications and had a resurgence of her symptoms. Thus we'll start her previous medication regimen.   Bipolar disorder-restart her clonazepam to 0.5 mg twice daily as needed for anxiety,  fluoxetine  40 mg daily,  lithium carbonate CR 600 mg in the morning. At her last visit I did give her a laboratory slip to have lithium levels and associated labs done. She states she did go to [facility] family practice and have these done sometime around 04/05/2015. I'm having her sign a release so we can get those laboratory studies.   She'll follow up in an month. He is  aware my departure from the clinic and that she will follow up with another provider in this clinic. She has been encouraged call any questions or concerns prior to her next appointment.  [name] 04/16/2015, 9:30 AM

## 2015-05-14 ENCOUNTER — Encounter: Payer: Self-pay | Admitting: Family Medicine

## 2015-05-14 ENCOUNTER — Ambulatory Visit (INDEPENDENT_AMBULATORY_CARE_PROVIDER_SITE_OTHER): Payer: Medicaid Other | Admitting: Family Medicine

## 2015-05-14 VITALS — BP 100/80 | HR 84 | Temp 98.3°F | Resp 16 | Wt 266.8 lb

## 2015-05-14 DIAGNOSIS — K130 Diseases of lips: Secondary | ICD-10-CM | POA: Diagnosis not present

## 2015-05-14 NOTE — Progress Notes (Signed)
Subjective:     Patient ID: Ann Morales, female   DOB: Jun 14, 1975, 40 y.o.   MRN: [id number]  HPI  Chief Complaint  Patient presents with  . Blister    Patient comes in office today with concerns of blister on her lowe lip. Patient reports that blister has been present for the past two weeks, she denies taking any otc medication.   States it started as a blister which she picked and manipulated. Now has dark, hard spot which itches and hurts. No prior hx of cold sores. Accompanied by her daughter today.   Review of Systems     Objective:   Physical Exam  Constitutional: She appears well-developed and well-nourished. No distress.  Skin:  Right lower lip with 0.5 cm dark, indurated, eschar slightly crossing over the lip border to the chin. Attempted cauterization with silver nitrate sticks but product defective with minimal medication on the tip.       Assessment:    1. Lip lesion: ? Residual eschar from lip trauma; ? Pyogenic granuloma.     Plan:    Discussed use of vaseline to soften eschar to allow removal. If not improving to call with ENT referral.

## 2015-05-14 NOTE — Patient Instructions (Signed)
Try vaseline 1-2 x day. Call me if not improving over the course of the lip.

## 2015-05-15 ENCOUNTER — Ambulatory Visit (INDEPENDENT_AMBULATORY_CARE_PROVIDER_SITE_OTHER): Payer: Medicaid Other | Admitting: Psychiatry

## 2015-05-15 ENCOUNTER — Encounter: Payer: Self-pay | Admitting: Psychiatry

## 2015-05-15 DIAGNOSIS — F319 Bipolar disorder, unspecified: Secondary | ICD-10-CM

## 2015-05-15 NOTE — Progress Notes (Signed)
Patient ID: Ann Morales, female   DOB: [birth year], 40 y.o.   MRN: [id number] [facility] MD/PA/NP OP Progress Note  05/15/2015 9:33 AM Kolleen Durrette  MRN:  [id number]  Subjective:  Patient returns for follow-up of her bipolar disorder. Patient was previously seen by Dr. Jimmye Norman and this is the first visit for this patient with this clinician. Patient was restarted on all her medications at her previous visit since she had discontinued her medications the past year. Patient reports being compliant with all her medications. She denies any side effects. States that her mood has been stable. Reports fair sleep and appetite. She states that she seems to overeat but doing okay overall. Patient discussed her disability paperwork and she was told that since this is the first visit for this clinician with patient, we will continue to monitor her health status and determine the need for disability at a future date. She reports that she last worked in 2013 and since then she has been unable to hold a job since she cannot focus. Patient stated that she would obtain her disability paperwork from her primary care physician.  Patient denies any suicidal thoughts. States that she has been doing okay overall.     Chief Complaint: Anxiety Chief Complaint    Follow-up; Medication Refill     Visit Diagnosis:   No diagnosis found.  Past Medical History:  Past Medical History  Diagnosis Date  . Anxiety   . Depression   . Bipolar disorder ([place])   . ADHD (attention deficit hyperactivity disorder)     Past Surgical History  Procedure Laterality Date  . Gallbladder surgery    . Tubal ligation     Family History:  Family History  Problem Relation Age of Onset  . Hypercholesterolemia Father   . Hypercholesterolemia Maternal Grandmother   . Hypertension Maternal Grandmother   . Anxiety disorder Maternal Grandmother   . Depression Maternal Grandmother   . Bipolar disorder Mother    Social History:   Social History   Social History  . Marital Status: Single    Spouse Name: N/A  . Number of Children: N/A  . Years of Education: N/A   Social History Main Topics  . Smoking status: Current Every Day Smoker -- 2.00 packs/day for 15 years    Types: Cigarettes    Start date: 09/25/1994  . Smokeless tobacco: Never Used  . Alcohol Use: No  . Drug Use: No  . Sexual Activity: Yes    Birth Control/ Protection: None   Other Topics Concern  . None   Social History Narrative   Additional History:   Assessment:   Musculoskeletal: Strength & Muscle Tone: within normal limits Gait & Station: normal Patient leans: N/A  Psychiatric Specialty Exam: HPI  Review of Systems  Psychiatric/Behavioral: Negative for depression, suicidal ideas, hallucinations, memory loss and substance abuse. The patient is nervous/anxious. The patient does not have insomnia.   All other systems reviewed and are negative.   Blood pressure 118/80, pulse 82, temperature 98.2 F (36.8 C), temperature source Tympanic, height 5' 5.5" (1.664 m), weight 264 lb 12.8 oz (120.112 kg), last menstrual period 04/17/2015, SpO2 94 %.Body mass index is 43.38 kg/(m^2).  General Appearance: Well Groomed  Eye Contact:  Good  Speech:  Normal Rate  Volume:  Normal  Mood:  Good   Affect:  Congruent  Thought Process:  Linear and Logical  Orientation:  Full (Time, Place, and Person)  Thought Content:  Negative  Suicidal Thoughts:  No  Homicidal Thoughts:  No  Memory:  Immediate;   Good Recent;   Good Remote;   Good  Judgement:  Good  Insight:  Good  Psychomotor Activity:  Negative  Concentration:  Good  Recall:  Good  Fund of Knowledge: Good  Language: Good  Akathisia:  Negative  Handed:  Right  AIMS (if indicated): N/A  Assets:  Desire for Improvement Social Support Vocational/Educational  ADL's:  Intact  Cognition: WNL  Sleep: fair   Is the patient at risk to self?  No. Has the patient been a risk to self in  the past 6 months?  No. Has the patient been a risk to self within the distant past?  No. Is the patient a risk to others?  No. Has the patient been a risk to others in the past 6 months?  No. Has the patient been a risk to others within the distant past?  No.  Current Medications: Current Outpatient Prescriptions  Medication Sig Dispense Refill  . azelastine (ASTELIN) 0.1 % nasal spray SPRAY ONCE IN EACH NOSTRIL TWICE DAILY  12  . clonazePAM (KLONOPIN) 0.5 MG tablet Take 1 tablet (0.5 mg total) by mouth 2 (two) times daily as needed for anxiety. 60 tablet 3  . FLUoxetine (PROZAC) 20 MG capsule Take one capsule in the morning for seven days then increase to two capsules in the morning. 60 capsule 3  . lithium carbonate (LITHOBID) 300 MG CR tablet Take 2 tablets (600 mg total) by mouth every morning. 60 tablet 3  . omeprazole (PRILOSEC) 20 MG capsule Reported on 05/14/2015  2   No current facility-administered medications for this visit.    Medical Decision Making:  Established Problem, Stable/Improving (1), Review or order medicine tests (1) and Review of Medication Regimen & Side Effects (2)  Treatment Plan Summary:Medication management and Plan   Bipolar disorder Decrease Klonopin to 0.5 mg once daily for 2 weeks and then discontinue . Continue fluoxetine at 40 mg daily  Continue lithium carbonate CR 600 mg in the morning. Patient reports that she got her labs done last month so and she did sign a consent so we should have the results.  She'll follow up in 2 month. She has been encouraged to call any questions or concerns prior to her next appointment.  Miyana Mordecai 05/15/2015, 9:33 AM

## 2015-05-18 ENCOUNTER — Other Ambulatory Visit: Payer: Self-pay | Admitting: Family Medicine

## 2015-05-22 ENCOUNTER — Encounter: Payer: Self-pay | Admitting: Family Medicine

## 2015-05-22 ENCOUNTER — Ambulatory Visit (INDEPENDENT_AMBULATORY_CARE_PROVIDER_SITE_OTHER): Payer: Medicaid Other | Admitting: Family Medicine

## 2015-05-22 VITALS — BP 110/90 | HR 81 | Temp 98.0°F | Resp 16 | Ht 67.0 in | Wt 267.2 lb

## 2015-05-22 DIAGNOSIS — M7061 Trochanteric bursitis, right hip: Secondary | ICD-10-CM

## 2015-05-22 NOTE — Progress Notes (Signed)
Subjective:     Patient ID: Ann Morales, female   DOB: [birth year], 40 y.o.   MRN: [id number]  HPI  Chief Complaint  Patient presents with  . Diability Review    Patient comes in office today to have provider review and fill out disability forms, patient reports that she has no questions or concerns today.   States lip lesion resolved with use of vaseline.. States she is not working and has applied for SS disability for Bipolar disorder. She has received disability forms in the mail from [facility] disability. I suggested that she SS has their own examiners to determine disability or her psychiatrist would be the next choice to fill out the forms. She continue to have right trochanteric bursitis despite injection and course of nsaid's per orthopedics. Wishes a second opinion.   Review of Systems     Objective:   Physical Exam  Constitutional: She appears well-developed and well-nourished. No distress.  Musculoskeletal: Tenderness: right trochanteric area.       Assessment:    1. Trochanteric bursitis of right hip - Ambulatory referral to Orthopedic Surgery    Plan:    She is to recheck with SS disability about form completion.

## 2015-05-22 NOTE — Patient Instructions (Signed)
We will call you with the referral.

## 2015-05-28 ENCOUNTER — Other Ambulatory Visit: Payer: Self-pay | Admitting: Orthopedic Surgery

## 2015-05-28 DIAGNOSIS — M545 Low back pain: Secondary | ICD-10-CM

## 2015-06-06 ENCOUNTER — Ambulatory Visit
Admission: RE | Admit: 2015-06-06 | Discharge: 2015-06-06 | Disposition: A | Discharge status: Home / Self Care | Payer: Medicaid Other | Source: Ambulatory Visit | Attending: Orthopedic Surgery | Admitting: Orthopedic Surgery

## 2015-06-06 DIAGNOSIS — M545 Low back pain: Secondary | ICD-10-CM

## 2015-06-16 ENCOUNTER — Other Ambulatory Visit: Payer: Self-pay | Admitting: Family Medicine

## 2015-07-11 ENCOUNTER — Ambulatory Visit (INDEPENDENT_AMBULATORY_CARE_PROVIDER_SITE_OTHER): Payer: Self-pay | Admitting: Psychiatry

## 2015-07-19 ENCOUNTER — Other Ambulatory Visit: Payer: Self-pay | Admitting: Family Medicine

## 2015-07-19 DIAGNOSIS — M25521 Pain in right elbow: Principal | ICD-10-CM

## 2015-07-19 DIAGNOSIS — G8929 Other chronic pain: Secondary | ICD-10-CM

## 2015-10-09 ENCOUNTER — Other Ambulatory Visit: Payer: Self-pay | Admitting: Family Medicine

## 2015-10-09 ENCOUNTER — Telehealth: Payer: Self-pay | Admitting: Family Medicine

## 2015-10-09 DIAGNOSIS — M25551 Pain in right hip: Secondary | ICD-10-CM

## 2015-10-09 NOTE — Telephone Encounter (Signed)
Pt is requesting an additional referral sent to [facility] doctor office so she can continue to be seen for hip pain.  [phone]

## 2015-10-09 NOTE — Telephone Encounter (Signed)
Will refer for ongoing evaluation of right hip pain

## 2015-10-09 NOTE — Telephone Encounter (Signed)
Please advise, Amparo Bristol

## 2015-11-27 ENCOUNTER — Ambulatory Visit (INDEPENDENT_AMBULATORY_CARE_PROVIDER_SITE_OTHER): Payer: Medicaid Other | Admitting: Family Medicine

## 2015-11-27 ENCOUNTER — Encounter: Payer: Self-pay | Admitting: Family Medicine

## 2015-11-27 ENCOUNTER — Other Ambulatory Visit: Payer: Self-pay | Admitting: Family Medicine

## 2015-11-27 VITALS — BP 124/70 | HR 80 | Temp 98.3°F | Resp 16 | Wt 261.0 lb

## 2015-11-27 DIAGNOSIS — N760 Acute vaginitis: Secondary | ICD-10-CM

## 2015-11-27 DIAGNOSIS — J4520 Mild intermittent asthma, uncomplicated: Secondary | ICD-10-CM | POA: Diagnosis not present

## 2015-11-27 DIAGNOSIS — N3090 Cystitis, unspecified without hematuria: Secondary | ICD-10-CM

## 2015-11-27 LAB — POCT URINALYSIS DIPSTICK
Bilirubin, UA: NEGATIVE
Glucose, UA: NEGATIVE
Ketones, UA: NEGATIVE
Leukocytes, UA: NEGATIVE
Nitrite, UA: NEGATIVE
Protein, UA: NEGATIVE
Spec Grav, UA: >=1.03
Urobilinogen, UA: 0.2
pH, UA: 5

## 2015-11-27 MED ORDER — NITROFURANTOIN MONOHYD MACRO 100 MG PO CAPS: 100.0000 mg | ORAL_CAPSULE | Freq: Two times a day (BID) | ORAL | 0 refills | Status: DC

## 2015-11-27 MED ORDER — FLUCONAZOLE 150 MG PO TABS: 150.0000 mg | ORAL_TABLET | Freq: Once | ORAL | 1 refills | Status: AC

## 2015-11-27 MED ORDER — ALBUTEROL SULFATE HFA 108 (90 BASE) MCG/ACT IN AERS: 2.0000 | INHALATION_SPRAY | Freq: Four times a day (QID) | RESPIRATORY_TRACT | 5 refills | Status: DC | PRN

## 2015-11-27 NOTE — Progress Notes (Signed)
Subjective:     Patient ID: Ann Morales, female   DOB: 03/07/1976, 40 y.o.   MRN: [id number]  HPI  Chief Complaint  Patient presents with  . Urinary Tract Infection  States she has noticed urinary burning and frequency over the last few days. Also has had vaginal itching with a creamy appearing discharge. Wishes refill on albuterol inhaler.   Review of Systems  Musculoskeletal:       Reports waking up with a stiff neck. Discussed using pillow in her bed which keeps her head,neck, shoulders, and hips in a straight line.       Objective:   Physical Exam  Constitutional: She appears well-developed and well-nourished. No distress.  Genitourinary:  Genitourinary Comments: No CVA tenderness       Assessment:    1. Cystitis - nitrofurantoin, macrocrystal-monohydrate, (MACROBID) 100 MG capsule; Take 1 capsule (100 mg total) by mouth 2 (two) times daily.  Dispense: 14 capsule; Refill: 0 - POCT urinalysis dipstick - Urine culture  2. Vaginitis - fluconazole (DIFLUCAN) 150 MG tablet; Take 1 tablet (150 mg total) by mouth once.  Dispense: 1 tablet; Refill: 1  3. Asthma, mild intermittent, uncomplicated - albuterol (PROVENTIL HFA;VENTOLIN HFA) 108 (90 Base) MCG/ACT inhaler; Inhale 2 puffs into the lungs every 6 (six) hours as needed for wheezing or shortness of breath.  Dispense: 1 Inhaler; Refill: 5    Plan:   Further f/u pending urine culture.

## 2015-11-27 NOTE — Patient Instructions (Signed)
We will call you with the culture results 

## 2015-11-29 LAB — URINE CULTURE

## 2015-11-30 ENCOUNTER — Telehealth: Payer: Self-pay

## 2015-11-30 ENCOUNTER — Telehealth: Payer: Self-pay | Admitting: Family Medicine

## 2015-11-30 NOTE — Telephone Encounter (Signed)
Patient has been advised. KW 

## 2015-11-30 NOTE — Telephone Encounter (Signed)
Advised patient as below. Patient reports that she still has vaginal itching. She reports that she will D/C abx. Will you be sending in something for yeast? Patient reports that she would like something else called in. She uses [facility] in [place]. Thanks!

## 2015-11-30 NOTE — Telephone Encounter (Signed)
LMTCB-KW 

## 2015-11-30 NOTE — Telephone Encounter (Signed)
error 

## 2015-11-30 NOTE — Telephone Encounter (Signed)
-----   Message from Carmon Ginsberg, [place] sent at 11/30/2015  7:37 AM EDT ----- No specific organism detected. It  Is possible the yeast infection was contributing to your symptoms. Are you feeling better? You can stop the antibiotic.

## 2015-11-30 NOTE — Telephone Encounter (Signed)
Refill the Diflucan pill. I put a refill on it.

## 2015-12-03 ENCOUNTER — Telehealth: Payer: Self-pay | Admitting: Family Medicine

## 2015-12-03 ENCOUNTER — Other Ambulatory Visit: Payer: Self-pay | Admitting: Family Medicine

## 2015-12-03 DIAGNOSIS — N760 Acute vaginitis: Secondary | ICD-10-CM

## 2015-12-03 MED ORDER — METRONIDAZOLE 500 MG PO TABS: 500.0000 mg | ORAL_TABLET | Freq: Two times a day (BID) | ORAL | 0 refills | Status: DC

## 2015-12-03 NOTE — Telephone Encounter (Signed)
Patient has taken the 2 pills and has uses 4 days of the monastat 7.  She is still having discharge, burning and itching.  Please advise. [phone].   If pt does not answer you can talk to her mom Diane.  Thanks, [employer]

## 2015-12-03 NOTE — Telephone Encounter (Signed)
Patient was prescribed Nitrofurantoin to treat bladder infection that on 11/27/15 patient developed vaginal itching and disharge and prescribed Diflucan 9/19 with one refill. Vaginal symptoms are still present please advise. KW

## 2015-12-03 NOTE — Telephone Encounter (Signed)
Finish the [employer]. I will send in metronidazole to cover for the other common vaginitis-bacterial vaginosis. If not improved with this come back in for further evaluation.

## 2015-12-03 NOTE — Telephone Encounter (Signed)
Advised patient's mother as below.

## 2015-12-31 ENCOUNTER — Other Ambulatory Visit: Payer: Self-pay | Admitting: Family Medicine

## 2015-12-31 DIAGNOSIS — K219 Gastro-esophageal reflux disease without esophagitis: Secondary | ICD-10-CM

## 2015-12-31 MED ORDER — OMEPRAZOLE 20 MG PO CPDR: 20.0000 mg | DELAYED_RELEASE_CAPSULE | Freq: Every day | ORAL | 1 refills | Status: DC

## 2016-01-16 ENCOUNTER — Encounter: Payer: Self-pay | Admitting: Family Medicine

## 2016-01-18 DIAGNOSIS — F909 Attention-deficit hyperactivity disorder, unspecified type: Secondary | ICD-10-CM | POA: Insufficient documentation

## 2016-01-18 DIAGNOSIS — F319 Bipolar disorder, unspecified: Secondary | ICD-10-CM | POA: Insufficient documentation

## 2016-01-18 DIAGNOSIS — F418 Other specified anxiety disorders: Secondary | ICD-10-CM | POA: Insufficient documentation

## 2016-01-18 DIAGNOSIS — F315 Bipolar disorder, current episode depressed, severe, with psychotic features: Secondary | ICD-10-CM | POA: Insufficient documentation

## 2016-01-22 ENCOUNTER — Encounter: Payer: Self-pay | Admitting: Family Medicine

## 2016-03-05 ENCOUNTER — Ambulatory Visit: Payer: Self-pay | Admitting: Family Medicine

## 2016-03-06 ENCOUNTER — Encounter: Payer: Self-pay | Admitting: Family Medicine

## 2016-03-06 ENCOUNTER — Ambulatory Visit (INDEPENDENT_AMBULATORY_CARE_PROVIDER_SITE_OTHER): Payer: Medicaid Other | Admitting: Family Medicine

## 2016-03-06 VITALS — BP 102/70 | HR 60 | Temp 98.6°F | Resp 16 | Wt 255.4 lb

## 2016-03-06 DIAGNOSIS — J0100 Acute maxillary sinusitis, unspecified: Secondary | ICD-10-CM | POA: Diagnosis not present

## 2016-03-06 DIAGNOSIS — K130 Diseases of lips: Secondary | ICD-10-CM | POA: Diagnosis not present

## 2016-03-06 MED ORDER — AMOXICILLIN-POT CLAVULANATE 875-125 MG PO TABS: 1.0000 | ORAL_TABLET | Freq: Two times a day (BID) | ORAL | 0 refills | Status: DC

## 2016-03-06 NOTE — Progress Notes (Signed)
Subjective:     Patient ID: Ann Morales, female   DOB: [birth year], 40 y.o.   MRN: [id number]  HPI  Chief Complaint  Patient presents with  . Mouth Lesions    Patient comes in office today with concerns of could sore on her lip and mouth blisters that has been present for the past 3 weeks. Paient reports that she has also had congestion, coughing thick yellow sputum and sore throat on right side. Patient states that she has soreness and tenderness on the right side of her face as well, she has been using otc triple antibiotic ointment on her mouth lesions.   Patient reports increased sinus pressure, purulent sinus drainage, post nasal drainage and accompanying cough. Has been taking Tylenol sinus for her sx. Reports cold sore on her upper lip has not resolved in two months. Continues to be itching and painful. Currently followed by the [facility] and Dr. Dorann Ou,, Psychiatry [facility])   Review of Systems     Objective:   Physical Exam  Constitutional: She appears well-developed and well-nourished. No distress.  Ears: T.M's intact without inflammation Sinuses: mild maxillary sinus tenderness' Lips: 1 cm raised crusted lesion on her upper lip. Throat: no tonsillar enlargement or exudate Neck: Tender right anterior cervical node. Lungs: clear     Assessment:    1. Acute maxillary sinusitis, recurrence not specified - amoxicillin-clavulanate (AUGMENTIN) 875-125 MG tablet; Take 1 tablet by mouth 2 (two) times daily.  Dispense: 20 tablet; Refill: 0  2. Lip lesion: r/o carcinoma - Ambulatory referral to Dermatology    Plan:    Discussed use of Mucinex D.

## 2016-03-06 NOTE — Patient Instructions (Addendum)
Discussed use of Mucinex D. We will call you about the dermatology appointment.

## 2016-04-04 ENCOUNTER — Encounter: Payer: Self-pay | Admitting: Family Medicine

## 2016-04-04 DIAGNOSIS — G4733 Obstructive sleep apnea (adult) (pediatric): Secondary | ICD-10-CM | POA: Insufficient documentation

## 2016-04-08 ENCOUNTER — Ambulatory Visit (INDEPENDENT_AMBULATORY_CARE_PROVIDER_SITE_OTHER): Payer: Medicaid Other | Admitting: Vascular Surgery

## 2016-04-08 ENCOUNTER — Encounter (INDEPENDENT_AMBULATORY_CARE_PROVIDER_SITE_OTHER): Payer: Self-pay | Admitting: Vascular Surgery

## 2016-04-08 VITALS — BP 112/60 | HR 77 | Resp 16 | Ht 67.0 in | Wt 244.0 lb

## 2016-04-08 DIAGNOSIS — F17200 Nicotine dependence, unspecified, uncomplicated: Secondary | ICD-10-CM | POA: Diagnosis not present

## 2016-04-08 DIAGNOSIS — I83813 Varicose veins of bilateral lower extremities with pain: Secondary | ICD-10-CM | POA: Insufficient documentation

## 2016-04-08 DIAGNOSIS — E785 Hyperlipidemia, unspecified: Secondary | ICD-10-CM | POA: Diagnosis not present

## 2016-04-08 DIAGNOSIS — I872 Venous insufficiency (chronic) (peripheral): Secondary | ICD-10-CM | POA: Insufficient documentation

## 2016-04-08 NOTE — Progress Notes (Signed)
Subjective:    Patient ID: Ann Morales, female    DOB: 03-23-1975, 41 y.o.   MRN: [id number] Chief Complaint  Patient presents with  . Follow-up   Patient presents after not being seen for a few years. She endorses a history of left lower extremity GSV laser ablation. Over the last few years she has noticed the varicose veins located on both of her lower extremities have increased in size and discomfort. Left is worse then right extremity. Describes the pain as a throbbing and stinging pain. The pain and swelling worsen throughout the day. She does not wear compression stockings or elevate her lower extremities. Denies any history of DVT, trauma or surgery to either lower extremity. Denies any fever nausea or vomiting. No history of ulcer formation.   Review of Systems  Constitutional: Negative.   HENT: Negative.   Eyes: Negative.   Respiratory: Negative.   Cardiovascular: Positive for leg swelling (Bilateral. ).  Gastrointestinal: Negative.   Endocrine: Negative.   Genitourinary: Negative.   Musculoskeletal: Negative.   Skin: Negative.   Neurological: Negative.   Hematological: Negative.   Psychiatric/Behavioral: Negative.       Objective:   Physical Exam  Constitutional: She is oriented to person, place, and time. She appears well-developed and well-nourished.  HENT:  Head: Normocephalic and atraumatic.  Right Ear: External ear normal.  Left Ear: External ear normal.  Mouth/Throat: Oropharynx is clear and moist.  Eyes: Conjunctivae and EOM are normal. Pupils are equal, round, and reactive to light.  Neck: Normal range of motion.  Cardiovascular: Normal rate, regular rhythm, normal heart sounds and intact distal pulses.   Pulses:      Radial pulses are 2+ on the right side, and 2+ on the left side.       Dorsalis pedis pulses are 2+ on the right side, and 2+ on the left side.       Posterior tibial pulses are 2+ on the right side, and 2+ on the left side.  Bilateral  >1cm varicose veins. Moderate edema bilaterally.  Diffuse spider veins noted.  Pulmonary/Chest: Effort normal and breath sounds normal.  Abdominal: Soft. Bowel sounds are normal.  Musculoskeletal: Normal range of motion. She exhibits edema (Bilateral >1cm varicose veins. Moderate edema bilaterally.  Diffuse spider veins noted.).  Neurological: She is alert and oriented to person, place, and time.  Skin: Skin is warm and dry.  Psychiatric: She has a normal mood and affect. Her behavior is normal. Judgment and thought content normal.   BP 112/60   Pulse 77   Resp 16   Ht 5\' 7"  (1.702 m)   Wt 244 lb (110.7 kg)   BMI 38.22 kg/m   Past Medical History:  Diagnosis Date  . ADHD (attention deficit hyperactivity disorder)   . Anxiety   . Bipolar disorder ([place])   . Depression    Social History   Social History  . Marital status: Single    Spouse name: N/A  . Number of children: N/A  . Years of education: N/A   Occupational History  . Not on file.   Social History Main Topics  . Smoking status: Current Every Day Smoker    Packs/day: 2.00    Years: 15.00    Types: Cigarettes    Start date: 09/25/1994  . Smokeless tobacco: Never Used  . Alcohol use No  . Drug use: No  . Sexual activity: Yes    Birth control/ protection: None   Other Topics  Concern  . Not on file   Social History Narrative  . No narrative on file   Past Surgical History:  Procedure Laterality Date  . GALLBLADDER SURGERY    . TUBAL LIGATION     Family History  Problem Relation Age of Onset  . Hypercholesterolemia Father   . Hypercholesterolemia Maternal Grandmother   . Hypertension Maternal Grandmother   . Anxiety disorder Maternal Grandmother   . Depression Maternal Grandmother   . Bipolar disorder Mother    Allergies  Allergen Reactions  . Codeine Hives and Shortness Of Breath  . Azithromycin Other (See Comments)  . Codeine Sulfate Other (See Comments)  . Sulfa Antibiotics Other (See Comments)     unknown      Assessment & Plan:  Patient presents after not being seen for a few years. She endorses a history of left lower extremity GSV laser ablation. Over the last few years she has noticed the varicose veins located on both of her lower extremities have increased in size and discomfort. Left is worse then right extremity. Describes the pain as a throbbing and stinging pain. The pain and swelling worsen throughout the day. She does not wear compression stockings or elevate her lower extremities. Denies any history of DVT, trauma or surgery to either lower extremity. Denies any fever nausea or vomiting. No history of ulcer formation.  1. Chronic venous insufficiency - Worsening Patient with worsening swelling and painful varicose veins Will order venous duplex to assess anatomy and rule out worsening reflux. The patient was encouraged to wear graduated compression stockings (20-30 mmHg) on a daily basis. The patient was instructed to begin wearing the stockings first thing in the morning and removing them in the evening. The patient was instructed specifically not to sleep in the stockings. Prescription In addition, behavioral modification including elevation during the day will be initiated. Anti-inflammatories for pain.  - VAS [place] LOWER EXTREMITY VENOUS REFLUX; Future  2. Current smoker - Stable I have discussed (approximately 5 minutes) with the patient the role of tobacco in the pathogenesis of atherosclerosis and its effect on the progression of the disease, impact on the durability of interventions and its limitations on the formation of collateral pathways. I have recommended absolute tobacco cessation. I have discussed various options available for assistance with tobacco cessation including over the counter methods (Nicotine gum, patch and lozenges). We also discussed prescription options (Chantix, Nicotine Inhaler / Nasal Spray). The patient is not interested in pursuing any  prescription tobacco cessation options at this time. The patient voices their understanding.   3. Hyperlipidemia, unspecified hyperlipidemia type - Stable Encouraged good control as its slows the progression of atherosclerotic disease  4. Varicose veins of bilateral lower extremities with pain - Stable Will order venous duplex to assess anatomy and rule out worsening reflux. The patient was encouraged to wear graduated compression stockings (20-30 mmHg) on a daily basis. The patient was instructed to begin wearing the stockings first thing in the morning and removing them in the evening. The patient was instructed specifically not to sleep in the stockings. Prescription In addition, behavioral modification including elevation during the day will be initiated. Anti-inflammatories for pain.  Current Outpatient Prescriptions on File Prior to Visit  Medication Sig Dispense Refill  . albuterol (PROVENTIL HFA;VENTOLIN HFA) 108 (90 Base) MCG/ACT inhaler Inhale 2 puffs into the lungs every 6 (six) hours as needed for wheezing or shortness of breath. 1 Inhaler 5  . amoxicillin-clavulanate (AUGMENTIN) 875-125 MG tablet Take 1  tablet by mouth 2 (two) times daily. 20 tablet 0  . azelastine (ASTELIN) 0.1 % nasal spray SPRAY ONCE IN EACH NOSTRIL TWICE DAILY  12  . clonazePAM (KLONOPIN) 0.5 MG tablet Take 1 tablet (0.5 mg total) by mouth 2 (two) times daily as needed for anxiety. 60 tablet 3  . lisdexamfetamine (VYVANSE) 40 MG capsule Take 40 mg by mouth every morning.    . lithium carbonate (LITHOBID) 300 MG CR tablet Take 2 tablets (600 mg total) by mouth every morning. 60 tablet 3  . loratadine (CLARITIN) 10 MG tablet TAKE 1 TABLET BY MOUTH EVERY DAY 30 tablet 5  . omeprazole (PRILOSEC) 20 MG capsule Take 1 capsule (20 mg total) by mouth daily. 90 capsule 1  . FLUoxetine (PROZAC) 20 MG capsule Take one capsule in the morning for seven days then increase to two capsules in the morning. (Patient not taking:  Reported on 04/08/2016) 60 capsule 3   No current facility-administered medications on file prior to visit.     There are no Patient Instructions on file for this visit. No Follow-up on file.   Soledad Budreau A Taylee Gunnells, PA-C

## 2016-04-17 ENCOUNTER — Encounter: Payer: Self-pay | Admitting: Family Medicine

## 2016-04-18 ENCOUNTER — Telehealth: Payer: Self-pay | Admitting: Family Medicine

## 2016-04-18 ENCOUNTER — Other Ambulatory Visit: Payer: Self-pay | Admitting: Family Medicine

## 2016-04-18 NOTE — Telephone Encounter (Signed)
Per Ann Morales at [facility] pt will have to start process over to get CPAP machine.She was non compliant in past.Needs office visit stating that she has sleep apnea,what her sleep issues are and reason why pt did not wear CPAP nightly.Pt scheduled with you 04/21/16

## 2016-04-18 NOTE — Telephone Encounter (Signed)
Yes we have received sleep study. Is she on C-Pap?

## 2016-04-18 NOTE — Telephone Encounter (Signed)
Order in progress for new c-pap equipment

## 2016-04-18 NOTE — Telephone Encounter (Signed)
Pt reports she had to return her cpap for "personal reasons", and that PCP could either refer pt for another sleep study or order another CPAP. Please advise. Renaldo Fiddler, CMA

## 2016-04-18 NOTE — Telephone Encounter (Signed)
Pt called wanting to know if we have recd a fax from [facility] Dr. Dorann Ou.  Regarding sleep study results.  Pt's call back [phone]  Thanks teri

## 2016-04-18 NOTE — Telephone Encounter (Signed)
Pt advised. Ann Morales, CMA  

## 2016-04-18 NOTE — Telephone Encounter (Signed)
Sleep study is under the procedures tab dated 07/12/2015. Can you please review? Thanks. Renaldo Fiddler, CMA

## 2016-04-18 NOTE — Telephone Encounter (Signed)
Pt reports this was faxed 1-2 weeks ago. I asked Helene Kelp to look into this. Renaldo Fiddler, CMA

## 2016-04-21 ENCOUNTER — Ambulatory Visit (INDEPENDENT_AMBULATORY_CARE_PROVIDER_SITE_OTHER): Payer: Medicaid Other | Admitting: Family Medicine

## 2016-04-21 ENCOUNTER — Other Ambulatory Visit: Payer: Self-pay | Admitting: Family Medicine

## 2016-04-21 ENCOUNTER — Encounter: Payer: Self-pay | Admitting: Family Medicine

## 2016-04-21 VITALS — BP 122/84 | HR 85 | Temp 98.2°F | Resp 16 | Wt 242.0 lb

## 2016-04-21 DIAGNOSIS — G4733 Obstructive sleep apnea (adult) (pediatric): Secondary | ICD-10-CM | POA: Diagnosis not present

## 2016-04-21 NOTE — Progress Notes (Addendum)
Subjective:     Patient ID: Ann Morales, female   DOB: 03/09/1976, 41 y.o.   MRN: [id number]  HPI  Chief Complaint  Patient presents with  . Sleep Apnea    Pt needs reasessment of sleep apnea so she can get a new CPAP. Pt's most recent sleep study was 07/12/2015, which showed sleep apnea. Pt admits to snoring. Pt had to stop using CPAP 6 months ago because she had to move into her grandparent's garage. Pt reports she did not have room to have the CPAP, so pt had to return machine for noncompliance. Pt is now living inside her mother's house, and is able to start reusing the machine.  States she was able to use the C-Pap (12 cm/H20) for 2-3 months before her living situation changed. Reports she felt more rested in the AM and less likely to fall asleep during the day. Is eager to resume C-Pap. Epworth score today 18 reflecting current excessive daytime sleepiness Originally diagnosed with OSA per sleep study in May of 2017. She continues on medication for bipolar disorder.   Review of Systems     Objective:   Physical Exam  Constitutional: She appears well-developed and well-nourished. No distress.  HENT:  Minimal tonsillar enlargement       Assessment:    1. OSA (obstructive sleep apnea)     Plan:    Refer back for repeat split night assessment per insurance requirements.

## 2016-04-21 NOTE — Patient Instructions (Signed)
We will call you about any future sleep testing.

## 2016-04-23 ENCOUNTER — Ambulatory Visit (INDEPENDENT_AMBULATORY_CARE_PROVIDER_SITE_OTHER): Payer: Medicaid Other

## 2016-04-23 ENCOUNTER — Ambulatory Visit (INDEPENDENT_AMBULATORY_CARE_PROVIDER_SITE_OTHER): Payer: Medicaid Other | Admitting: Vascular Surgery

## 2016-04-23 DIAGNOSIS — I872 Venous insufficiency (chronic) (peripheral): Secondary | ICD-10-CM

## 2016-04-24 ENCOUNTER — Ambulatory Visit (INDEPENDENT_AMBULATORY_CARE_PROVIDER_SITE_OTHER): Payer: Medicaid Other | Admitting: Vascular Surgery

## 2016-04-24 ENCOUNTER — Encounter (INDEPENDENT_AMBULATORY_CARE_PROVIDER_SITE_OTHER): Payer: Self-pay | Admitting: Vascular Surgery

## 2016-04-24 VITALS — BP 134/83 | HR 96 | Resp 16 | Wt 238.0 lb

## 2016-04-24 DIAGNOSIS — G4733 Obstructive sleep apnea (adult) (pediatric): Secondary | ICD-10-CM | POA: Diagnosis not present

## 2016-04-24 DIAGNOSIS — I890 Lymphedema, not elsewhere classified: Secondary | ICD-10-CM | POA: Diagnosis not present

## 2016-04-24 DIAGNOSIS — I809 Phlebitis and thrombophlebitis of unspecified site: Secondary | ICD-10-CM | POA: Insufficient documentation

## 2016-04-24 DIAGNOSIS — I8002 Phlebitis and thrombophlebitis of superficial vessels of left lower extremity: Secondary | ICD-10-CM

## 2016-04-24 DIAGNOSIS — I872 Venous insufficiency (chronic) (peripheral): Secondary | ICD-10-CM

## 2016-04-24 DIAGNOSIS — I83813 Varicose veins of bilateral lower extremities with pain: Secondary | ICD-10-CM

## 2016-04-24 DIAGNOSIS — E785 Hyperlipidemia, unspecified: Secondary | ICD-10-CM

## 2016-04-24 NOTE — Progress Notes (Signed)
MRN : [id number]  Ann Morales is a 41 y.o. (June 16, 1975) female who presents with chief complaint of  Chief Complaint  Patient presents with  . Follow-up  .  History of Present Illness: The patient returns to the office for followup evaluation regarding leg swelling.  The swelling has persisted and the pain associated with swelling continues. There have not been any interval development of a ulcerations or wounds. However she has developed a hard red area of the medial lef tthigh that is very painful.  Since the previous visit the patient has been wearing graduated compression stockings and has noted little if any improvement in the lymphedema. The patient has been using compression routinely morning until night.  The patient also states elevation during the day and exercise is being done too.  Duplex ultrasound of the venous system of the left leg shows reflux of the left GSV and STP of the distal thigh  Current Meds  Medication Sig  . albuterol (PROVENTIL HFA;VENTOLIN HFA) 108 (90 Base) MCG/ACT inhaler Inhale 2 puffs into the lungs every 6 (six) hours as needed for wheezing or shortness of breath.  [name] Kitchen azelastine (ASTELIN) 0.1 % nasal spray SPRAY ONCE IN EACH NOSTRIL TWICE DAILY  . buPROPion (WELLBUTRIN XL) 300 MG 24 hr tablet   . clonazePAM (KLONOPIN) 0.5 MG tablet Take 1 tablet (0.5 mg total) by mouth 2 (two) times daily as needed for anxiety.  [name] Kitchen lisdexamfetamine (VYVANSE) 40 MG capsule Take 40 mg by mouth every morning.  . lithium carbonate (LITHOBID) 300 MG CR tablet Take 2 tablets (600 mg total) by mouth every morning.  . loratadine (CLARITIN) 10 MG tablet TAKE 1 TABLET BY MOUTH EVERY DAY  . omeprazole (PRILOSEC) 20 MG capsule Take 1 capsule (20 mg total) by mouth daily.  . traZODone (DESYREL) 100 MG tablet     Past Medical History:  Diagnosis Date  . ADHD (attention deficit hyperactivity disorder)   . Anxiety   . Bipolar disorder ([place])   . Depression     Past  Surgical History:  Procedure Laterality Date  . GALLBLADDER SURGERY    . TUBAL LIGATION      Social History Social History  Substance Use Topics  . Smoking status: Current Every Day Smoker    Packs/day: 2.00    Years: 15.00    Types: Cigarettes    Start date: 09/25/1994  . Smokeless tobacco: Never Used  . Alcohol use No    Family History Family History  Problem Relation Age of Onset  . Hypercholesterolemia Father   . Bipolar disorder Mother   . Hypercholesterolemia Maternal Grandmother   . Hypertension Maternal Grandmother   . Anxiety disorder Maternal Grandmother   . Depression Maternal Grandmother   No family history of bleeding/clotting disorders, porphyria or autoimmune disease   Allergies  Allergen Reactions  . Codeine Hives and Shortness Of Breath  . Azithromycin Other (See Comments)  . Codeine Sulfate Other (See Comments)  . Sulfa Antibiotics Other (See Comments)    unknown     REVIEW OF SYSTEMS (Negative unless checked)  Constitutional: [] Weight loss  [] Fever  [] Chills Cardiac: [] Chest pain   [] Chest pressure   [] Palpitations   [] Shortness of breath when laying flat   [] Shortness of breath with exertion. Vascular:  [] Pain in legs with walking   [] Pain in legs at rest  [] History of DVT   [] Phlebitis   [x] Swelling in legs   [x] Varicose veins   [] Non-healing ulcers Pulmonary:   []   Uses home oxygen   [] Productive cough   [] Hemoptysis   [] Wheeze  [] COPD   [] Asthma Neurologic:  [] Dizziness   [] Seizures   [] History of stroke   [] History of TIA  [] Aphasia   [] Vissual changes   [] Weakness or numbness in arm   [] Weakness or numbness in leg Musculoskeletal:   [] Joint swelling   [x] Joint pain   [] Low back pain Hematologic:  [] Easy bruising  [] Easy bleeding   [] Hypercoagulable state   [] Anemic Gastrointestinal:  [] Diarrhea   [] Vomiting  [] Gastroesophageal reflux/heartburn   [] Difficulty swallowing. Genitourinary:  [] Chronic kidney disease   [] Difficult urination  [] Frequent  urination   [] Blood in urine Skin:  [] Rashes   [] Ulcers  Psychological:  [] History of anxiety   []  History of major depression.  Physical Examination  Vitals:   04/24/16 1202  BP: 134/83  Pulse: 96  Resp: 16  Weight: 238 lb (108 kg)   Body mass index is 37.28 kg/m. Gen: WD/WN, NAD Head: [place]/AT, No temporalis wasting.  Ear/Nose/Throat: Hearing grossly intact, nares w/o erythema or drainage, poor dentition Eyes: PER, EOMI, sclera nonicteric.  Neck: Supple, no masses.  No bruit or JVD.  Pulmonary:  Good air movement, clear to auscultation bilaterally, no use of accessory muscles.  Cardiac: RRR, normal S1, S2, no Murmurs. Vascular: 3+ pitting edema with moderate venous stasis skin changes;large varicose veins >8 mm on the left Vessel Right Left  Radial Palpable Palpable  Ulnar Palpable Palpable  Brachial Palpable Palpable  Carotid Palpable Palpable  Femoral Palpable Palpable  Popliteal Palpable Palpable  PT Palpable Palpable  DP Palpable Palpable  Gastrointestinal: soft, non-distended. No guarding/no peritoneal signs.  Musculoskeletal: M/S 5/5 throughout.  No deformity or atrophy.  Neurologic: CN 2-12 intact. Pain and light touch intact in extremities.  Symmetrical.  Speech is fluent. Motor exam as listed above. Psychiatric: Judgment intact, Mood & affect appropriate for pt's clinical situation. Dermatologic: No rashes or ulcers noted.  No changes consistent with cellulitis. Lymph : No Cervical lymphadenopathy, no lichenification or skin changes of chronic lymphedema.  CBC Lab Results  Component Value Date   WBC 12.4 (H) 05/28/2013   HGB 13.5 05/28/2013   HCT 40.6 05/28/2013   MCV 86 05/28/2013   PLT 180 05/28/2013    BMET    Component Value Date/Time   NA 138 05/28/2013 0810   K 3.8 05/28/2013 0810   CL 106 05/28/2013 0810   CO2 28 05/28/2013 0810   GLUCOSE 93 05/28/2013 0810   BUN 12 05/28/2013 0810   CREATININE 1.06 05/28/2013 0810   CALCIUM 8.7 05/28/2013  0810   GFRNONAA >60 05/28/2013 0810   GFRAA >60 05/28/2013 0810   CrCl cannot be calculated (Patient's most recent lab result is older than the maximum 21 days allowed.).  COAG No results found for: INR, PROTIME  Radiology No results found.  Assessment/Plan 1. 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 Patient will continue  wearing graduated compression stockings class 1 on a daily basis, beginning first thing in the morning and removing them in the evening.    In addition, behavioral modification including elevation during the day was again discussed and this will continue.  The patient has utilized over the counter pain medications and has been exercising.  However, at this time conservative therapy has not alleviated the patient's symptoms of leg pain and swelling  Recommend: laser  ablation of the left great saphenous vein to eliminate the symptoms of pain and swelling of the lower extremities caused by the severe superficial venous reflux disease.   2. Varicose veins of bilateral lower extremities with pain See #1  3. Chronic venous insufficiency continue compression  4. Lymphedema Continue compression  5. Hyperlipidemia, unspecified hyperlipidemia type Continue statin as ordered and reviewed, no changes at this time  6. OSA (obstructive sleep apnea) Continue NSAID medications as already ordered, these medications have been reviewed and there are no changes at this time.     Hortencia Pilar, MD  04/24/2016 1:45 PM

## 2016-05-05 ENCOUNTER — Encounter: Payer: Self-pay | Admitting: Family Medicine

## 2016-05-07 ENCOUNTER — Encounter: Payer: Self-pay | Admitting: Family Medicine

## 2016-06-05 ENCOUNTER — Encounter (INDEPENDENT_AMBULATORY_CARE_PROVIDER_SITE_OTHER): Payer: Self-pay

## 2016-06-05 ENCOUNTER — Ambulatory Visit (INDEPENDENT_AMBULATORY_CARE_PROVIDER_SITE_OTHER): Payer: Medicaid Other | Admitting: Vascular Surgery

## 2016-06-05 ENCOUNTER — Encounter (INDEPENDENT_AMBULATORY_CARE_PROVIDER_SITE_OTHER): Payer: Self-pay | Admitting: Vascular Surgery

## 2016-06-05 VITALS — BP 102/68 | HR 63 | Resp 15 | Ht 67.0 in | Wt 215.0 lb

## 2016-06-05 DIAGNOSIS — I83813 Varicose veins of bilateral lower extremities with pain: Secondary | ICD-10-CM | POA: Diagnosis not present

## 2016-06-05 DIAGNOSIS — I872 Venous insufficiency (chronic) (peripheral): Secondary | ICD-10-CM

## 2016-06-09 ENCOUNTER — Other Ambulatory Visit (INDEPENDENT_AMBULATORY_CARE_PROVIDER_SITE_OTHER): Payer: Medicaid Other

## 2016-06-09 ENCOUNTER — Other Ambulatory Visit (INDEPENDENT_AMBULATORY_CARE_PROVIDER_SITE_OTHER): Payer: Self-pay | Admitting: Vascular Surgery

## 2016-06-09 DIAGNOSIS — I8392 Asymptomatic varicose veins of left lower extremity: Secondary | ICD-10-CM

## 2016-06-09 NOTE — Progress Notes (Signed)
    MRN : [id number]  Ann Morales is a 41 y.o. (20-Apr-1975) female who presents with chief complaint of  Chief Complaint  Patient presents with  . Varicose Veins    Left leg GSV laser ablation  .    The patient's left lower extremity was sterilely prepped and draped.  The ultrasound machine was used to visualize the great saphenous vein throughout its course.  A segment at the knee was selected for access.  The saphenous vein was accessed without difficulty using ultrasound guidance with a micropuncture needle.   An 0.018  wire was placed beyond the saphenofemoral junction through the sheath and the microneedle was removed.  The 65 cm sheath was then placed over the wire and the wire and dilator were removed.  The laser fiber was placed through the sheath and its tip was placed approximately 2 cm below the saphenofemoral junction.  Tumescent anesthesia was then created with a dilute lidocaine solution.  Laser energy was then delivered with constant withdrawal of the sheath and laser fiber.  Approximately 1200 Joules of energy were delivered over a length of 18 cm.  Sterile dressings were placed.  The patient tolerated the procedure well without complications.

## 2016-07-03 ENCOUNTER — Ambulatory Visit (INDEPENDENT_AMBULATORY_CARE_PROVIDER_SITE_OTHER): Payer: Medicaid Other | Admitting: Vascular Surgery

## 2016-07-03 ENCOUNTER — Encounter (INDEPENDENT_AMBULATORY_CARE_PROVIDER_SITE_OTHER): Payer: Self-pay | Admitting: Vascular Surgery

## 2016-07-03 VITALS — HR 74 | Resp 16 | Ht 67.0 in | Wt 226.0 lb

## 2016-07-03 DIAGNOSIS — I890 Lymphedema, not elsewhere classified: Secondary | ICD-10-CM

## 2016-07-03 DIAGNOSIS — I83813 Varicose veins of bilateral lower extremities with pain: Secondary | ICD-10-CM

## 2016-07-03 DIAGNOSIS — M79605 Pain in left leg: Secondary | ICD-10-CM

## 2016-07-03 DIAGNOSIS — I872 Venous insufficiency (chronic) (peripheral): Secondary | ICD-10-CM | POA: Diagnosis not present

## 2016-07-03 DIAGNOSIS — M79609 Pain in unspecified limb: Secondary | ICD-10-CM | POA: Insufficient documentation

## 2016-07-03 NOTE — Progress Notes (Signed)
MRN : [id number]  Ann Morales is a 41 y.o. (10-12-1975) female who presents with chief complaint of  Chief Complaint  Patient presents with  . Re-evaluation    Post Laser follow up  .  History of Present Illness: The patient returns to the office for followup status post laser ablation of the left saphenous vein on 05/28/2016.  The patient note significant improvement in the lower extremity pain but not resolution of the symptoms. The patient notes multiple residual varicosities bilaterally which continued to hurt with dependent positions and remained tender to palpation. The patient's swelling is minimally from preoperative status. The patient continues to wear graduated compression stockings on a daily basis but these are not eliminating the pain and discomfort. The patient continues to use over-the-counter anti-inflammatory medications to treat the pain and related symptoms but this has not given the patient relief. The patient notes the pain in the lower extremities is causing problems with daily exercise, problems at work and even with household activities such as preparing meals and doing dishes.  The patient is otherwise done well and there have been no complications related to the laser procedure or interval changes in the patient's overall   Post laser ultrasound shows successful ablation of the left great saphenous vein   No outpatient prescriptions have been marked as taking for the 07/03/16 encounter (Office Visit) with Katha Cabal, MD.    Past Medical History:  Diagnosis Date  . ADHD (attention deficit hyperactivity disorder)   . Anxiety   . Bipolar disorder ([place])   . Depression     Past Surgical History:  Procedure Laterality Date  . GALLBLADDER SURGERY    . TUBAL LIGATION      Social History Social History  Substance Use Topics  . Smoking status: Current Every Day Smoker    Packs/day: 2.00    Years: 15.00    Types: Cigarettes    Start date:  09/25/1994  . Smokeless tobacco: Never Used  . Alcohol use No    Family History Family History  Problem Relation Age of Onset  . Hypercholesterolemia Father   . Bipolar disorder Mother   . Hypercholesterolemia Maternal Grandmother   . Hypertension Maternal Grandmother   . Anxiety disorder Maternal Grandmother   . Depression Maternal Grandmother     Allergies  Allergen Reactions  . Codeine Hives and Shortness Of Breath  . Azithromycin Other (See Comments)  . Codeine Sulfate Other (See Comments)  . Sulfa Antibiotics Other (See Comments)    unknown     REVIEW OF SYSTEMS (Negative unless checked)  Constitutional: [] Weight loss  [] Fever  [] Chills Cardiac: [] Chest pain   [] Chest pressure   [] Palpitations   [] Shortness of breath when laying flat   [] Shortness of breath with exertion. Vascular:  [] Pain in legs with walking   [x] Pain in legs with standing  [] History of DVT   [] Phlebitis   [] Swelling in legs   [x] Varicose veins   [] Non-healing ulcers Pulmonary:   [] Uses home oxygen   [] Productive cough   [] Hemoptysis   [] Wheeze  [] COPD   [] Asthma Neurologic:  [] Dizziness   [] Seizures   [] History of stroke   [] History of TIA  [] Aphasia   [] Vissual changes   [] Weakness or numbness in arm   [] Weakness or numbness in leg Musculoskeletal:   [] Joint swelling   [x] Joint pain   [] Low back pain Hematologic:  [] Easy bruising  [] Easy bleeding   [] Hypercoagulable state   [] Anemic Gastrointestinal:  [] Diarrhea   []   Vomiting  [] Gastroesophageal reflux/heartburn   [] Difficulty swallowing. Genitourinary:  [] Chronic kidney disease   [] Difficult urination  [] Frequent urination   [] Blood in urine Skin:  [] Rashes   [] Ulcers  Psychological:  [] History of anxiety   []  History of major depression.  Physical Examination  Vitals:   07/03/16 0957  Pulse: 74  Resp: 16  Weight: 102.5 kg (226 lb)  Height: 5\' 7"  (1.702 m)   Body mass index is 35.4 kg/m. Gen: WD/WN, NAD Head: [place]/AT, No temporalis wasting.    Ear/Nose/Throat: Hearing grossly intact, nares w/o erythema or drainage Eyes: PER, EOMI, sclera nonicteric.  Neck: Supple, no large masses.   Pulmonary:  Good air movement, no audible wheezing bilaterally, no use of accessory muscles.  Cardiac: RRR, no JVD Vascular: Large varicosities present extensively greater than 5 mm left lower extremtity.  Mild venous stasis changes to the legs bilaterally.  2+ soft pitting edema Vessel Right Left  Radial Palpable Palpable  Ulnar Palpable Palpable  Brachial Palpable Palpable  Carotid Palpable Palpable  Femoral Palpable Palpable  Popliteal Palpable Palpable  PT Palpable Palpable  DP Palpable Palpable  Gastrointestinal: Non-distended. No guarding/no peritoneal signs.  Musculoskeletal: M/S 5/5 throughout.  No deformity or atrophy.  Neurologic: CN 2-12 intact. Symmetrical.  Speech is fluent. Motor exam as listed above. Psychiatric: Judgment intact, Mood & affect appropriate for pt's clinical situation. Dermatologic: No rashes or ulcers noted.  No changes consistent with cellulitis. Lymph : No lichenification or skin changes of chronic lymphedema.  CBC Lab Results  Component Value Date   WBC 12.4 (H) 05/28/2013   HGB 13.5 05/28/2013   HCT 40.6 05/28/2013   MCV 86 05/28/2013   PLT 180 05/28/2013    BMET    Component Value Date/Time   NA 138 05/28/2013 0810   K 3.8 05/28/2013 0810   CL 106 05/28/2013 0810   CO2 28 05/28/2013 0810   GLUCOSE 93 05/28/2013 0810   BUN 12 05/28/2013 0810   CREATININE 1.06 05/28/2013 0810   CALCIUM 8.7 05/28/2013 0810   GFRNONAA >60 05/28/2013 0810   GFRAA >60 05/28/2013 0810   CrCl cannot be calculated (Patient's most recent lab result is older than the maximum 21 days allowed.).  COAG No results found for: INR, PROTIME  Radiology No results found.  Assessment/Plan 1. Varicose veins of bilateral lower extremities with pain Recommend:  The patient has had successful ablation of the previously  incompetent saphenous venous system but still has persistent symptoms of pain and swelling that are having a negative impact on daily life and daily activities.  Patient should undergo injection sclerotherapy to treat the residual varicosities.  The risks, benefits and alternative therapies were reviewed in detail with the patient.  All questions were answered.  The patient agrees to proceed with sclerotherapy at their convenience.  The patient will continue wearing the graduated compression stockings and using the over-the-counter pain medications to treat her symptoms.     2. Chronic venous insufficiency No surgery or intervention at this point in time.    I have had a long discussion with the patient regarding venous insufficiency and why it  causes symptoms. I have discussed with the patient the chronic skin changes that accompany venous insufficiency and the long term sequela such as infection and ulceration.  Patient will begin wearing graduated compression stockings class 1 (20-30 mmHg) or compression wraps on a daily basis a prescription was given. The patient will put the stockings on first thing in the morning and  removing them in the evening. The patient is instructed specifically not to sleep in the stockings.    In addition, behavioral modification including several periods of elevation of the lower extremities during the day will be continued. I have demonstrated that proper elevation is a position with the ankles at heart level.  The patient is instructed to begin routine exercise, especially walking on a daily basis  Following the review of the ultrasound the patient will follow up in 2-3 months to reassess the degree of swelling and the control that graduated compression stockings or compression wraps  is offering.   The patient can be assessed for a Lymph Pump at that time  3. Pain of left lower extremity See #1&2  4. Lymphedema See #1&2    Hortencia Pilar,  MD  07/03/2016 10:51 AM

## 2016-07-08 ENCOUNTER — Encounter (INDEPENDENT_AMBULATORY_CARE_PROVIDER_SITE_OTHER): Payer: Self-pay | Admitting: Vascular Surgery

## 2016-07-08 ENCOUNTER — Ambulatory Visit (INDEPENDENT_AMBULATORY_CARE_PROVIDER_SITE_OTHER): Payer: Medicaid Other | Admitting: Vascular Surgery

## 2016-07-08 VITALS — BP 99/71 | HR 68 | Resp 16 | Ht 69.0 in | Wt 226.0 lb

## 2016-07-08 DIAGNOSIS — I83813 Varicose veins of bilateral lower extremities with pain: Secondary | ICD-10-CM | POA: Diagnosis not present

## 2016-07-08 NOTE — Progress Notes (Signed)
Varicose veins of left lower extremity with inflammation (454.1  I83.10) Current Plans   Indication: Patient presents with symptomatic varicose veins of the left lower extremity.   Procedure: Sclerotherapy using hypertonic saline mixed with 1% Lidocaine was performed on the left lower extremity. Compression wraps were placed. The patient tolerated the procedure well. 

## 2016-07-20 IMAGING — US US RENAL KIDNEY
1 series · 14 of 23 positions shown · non-contrast
Comparison: CT of the abdomen 09/11/2012.

CLINICAL DATA: 38-year-old female with left-sided flank pain for
the past 3 month, now developing pain in the right flank as well.
History of kidney stones.

EXAM:
RENAL/URINARY TRACT ULTRASOUND COMPLETE

[Series 1: us renal kidney · 0.25mm/px · 14 of 23 slices shown]
[im 1/23]
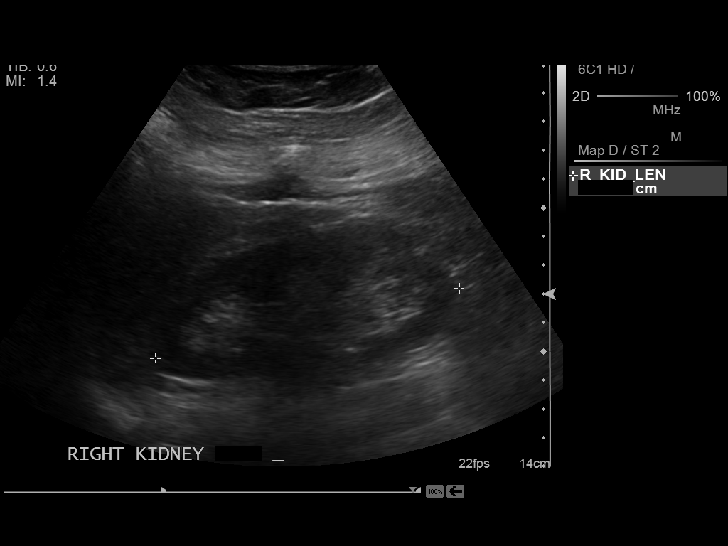
[im 3/23]
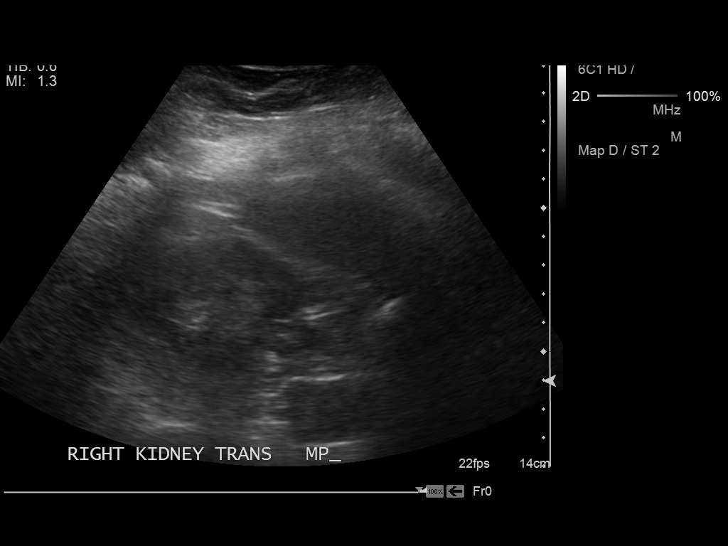
[im 5/23]
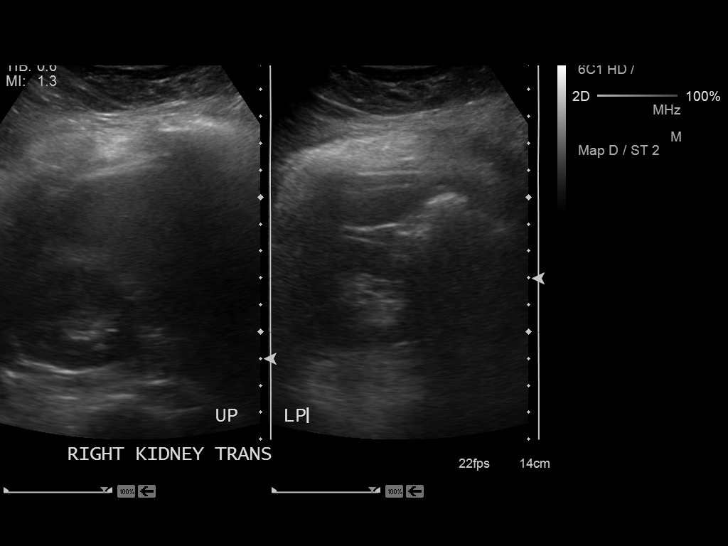
[im 6/23]
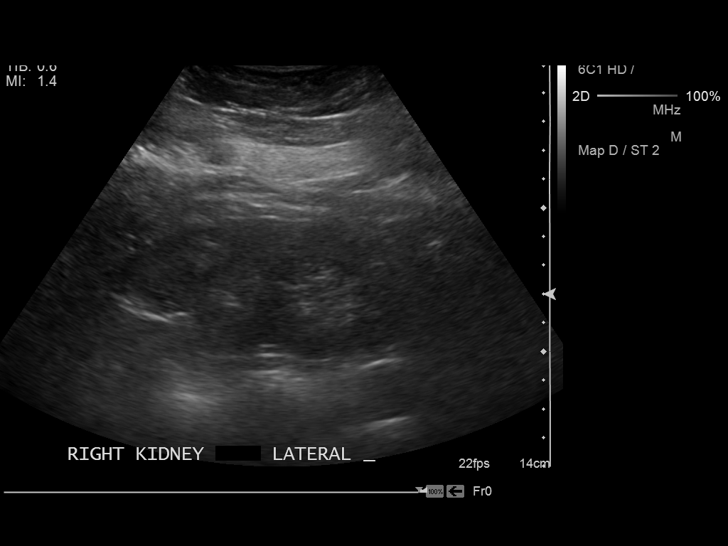
[im 8/23]
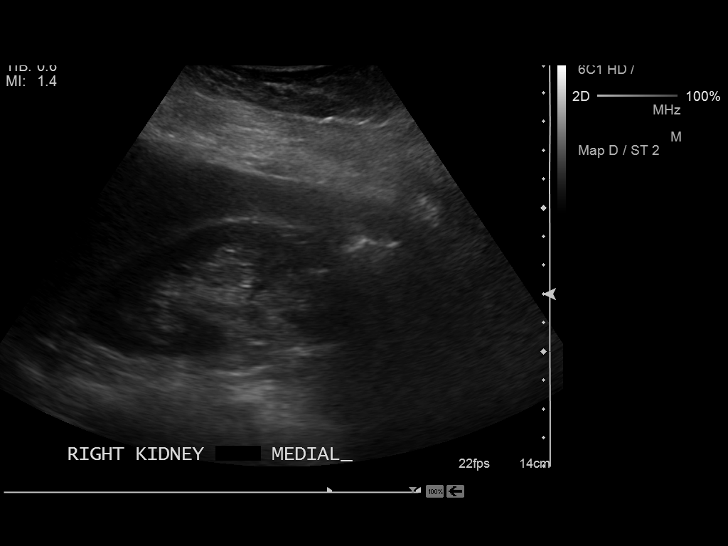
[im 10/23]
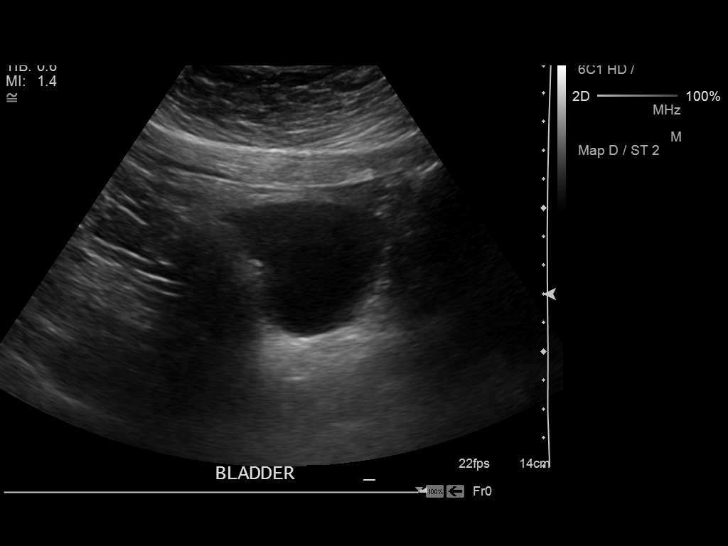
[im 11/23]
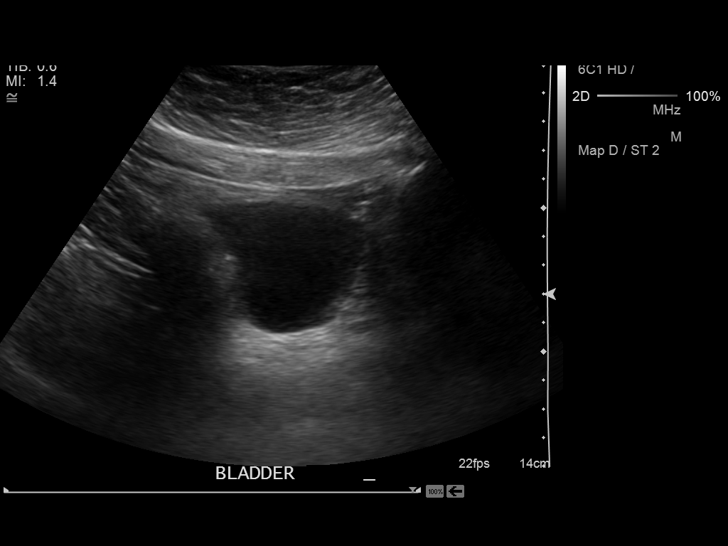
[im 13/23]
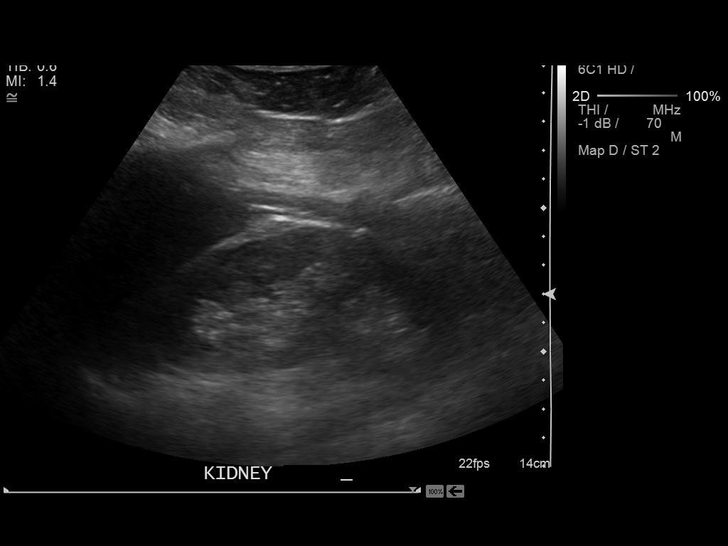
[im 14/23]
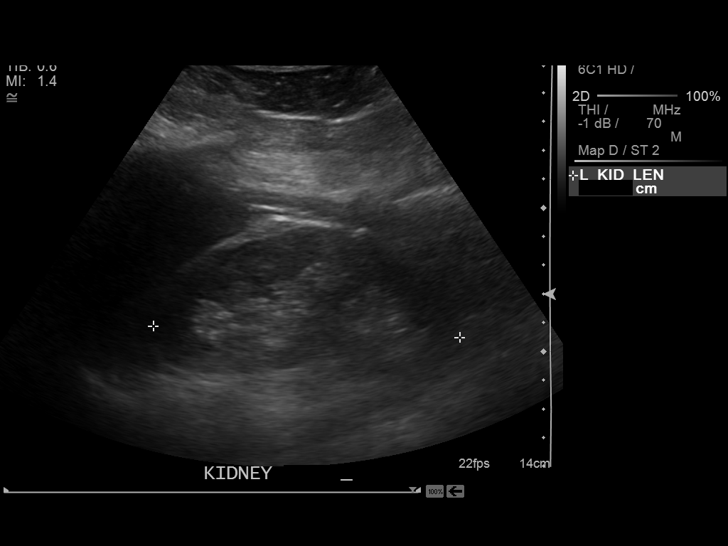
[im 16/23]
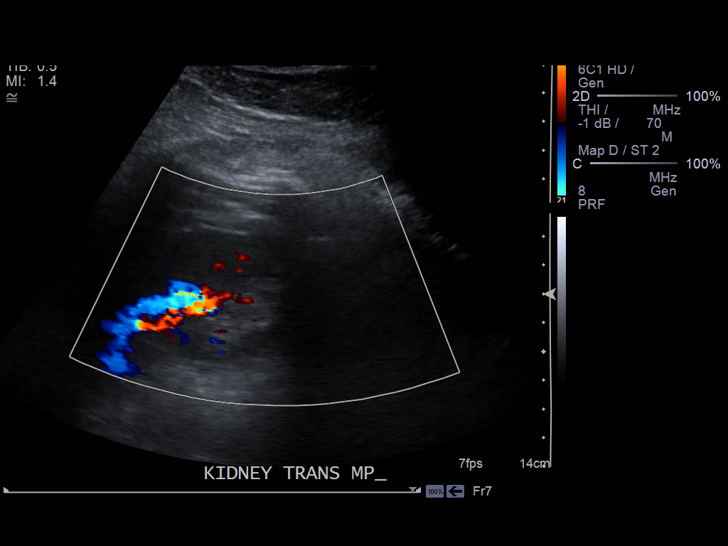
[im 18/23]
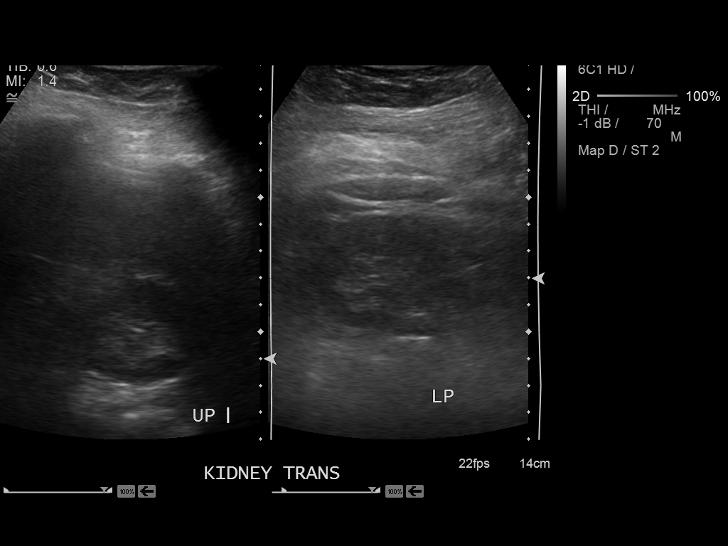
[im 19/23]
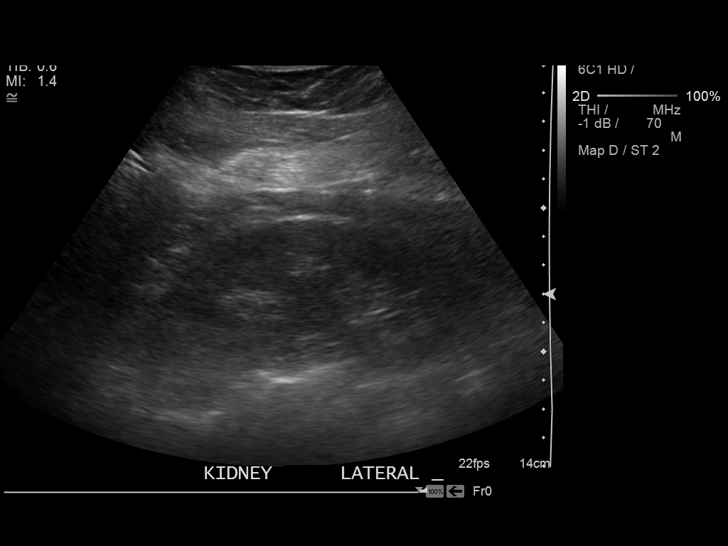
[im 21/23]
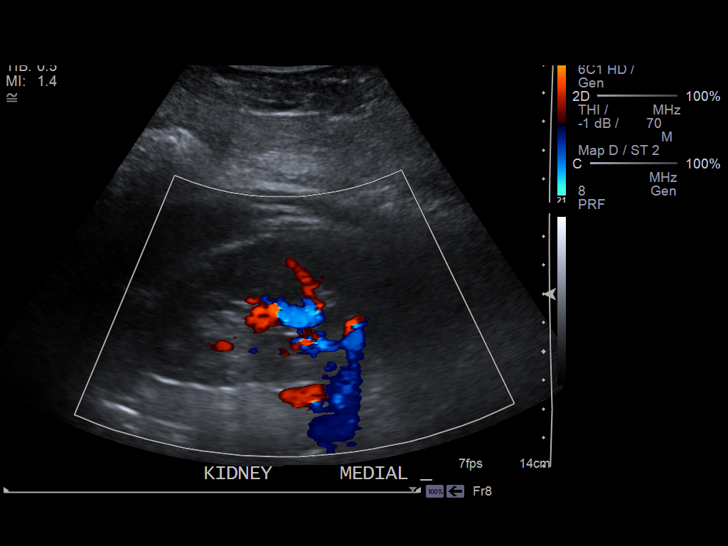
[im 23/23]
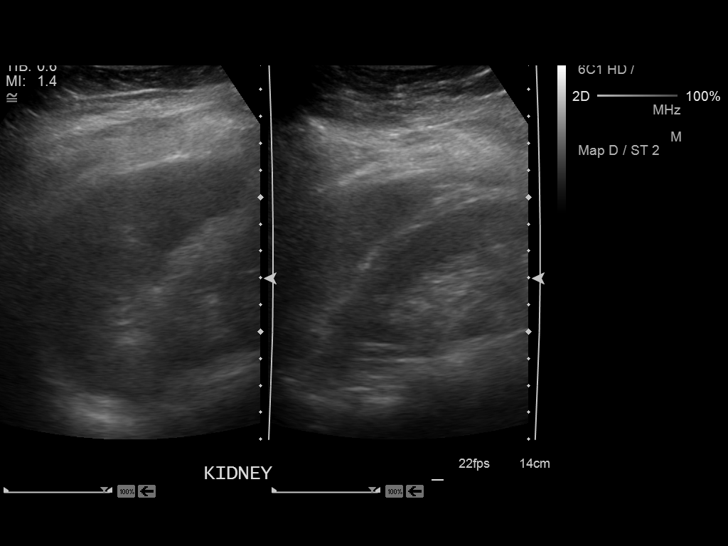

[14 of 23 positions shown; findings below may reference images not displayed]

FINDINGS: Right Kidney:

Length: 10.8 cm. Echogenicity within normal limits. No mass or
hydronephrosis visualized.

Left Kidney:

Length: 10.7 cm. Echogenicity within normal limits. No mass or
hydronephrosis visualized.

Bladder:

Appears normal for degree of bladder distention.
IMPRESSION: 1. Normal urinary tract ultrasound.

## 2016-08-01 ENCOUNTER — Encounter: Payer: Self-pay | Admitting: Family Medicine

## 2016-08-01 ENCOUNTER — Ambulatory Visit (INDEPENDENT_AMBULATORY_CARE_PROVIDER_SITE_OTHER): Payer: Medicaid Other | Admitting: Family Medicine

## 2016-08-01 VITALS — BP 112/80 | HR 85 | Temp 98.0°F | Resp 16 | Wt 228.4 lb

## 2016-08-01 DIAGNOSIS — M722 Plantar fascial fibromatosis: Secondary | ICD-10-CM | POA: Diagnosis not present

## 2016-08-01 NOTE — Patient Instructions (Signed)
We will call you with the podiatry referral. 

## 2016-08-01 NOTE — Progress Notes (Signed)
Subjective:     Patient ID: Ann Morales, female   DOB: 22-Sep-1975, 41 y.o.   MRN: [id number]  HPI  Chief Complaint  Patient presents with  . Foot Pain    Patient here today with c/o left heel pain for the last six months.Aggravated by walking. Reports no injury. Tried frozen water bottle and compression sock. Ibuprofen.  States it hurts the most when she first start to walk in the AM and after sitting for a while. Usual footwear is flip-flops. She has been on meloxicam with little improvement. Accompanied by her young niece today.   Review of Systems     Objective:   Physical Exam  Constitutional: She appears well-developed and well-nourished. No distress.  Cardiovascular:  Pulses:      Dorsalis pedis pulses are 2+ on the left side.       Posterior tibial pulses are 2+ on the left side.  Musculoskeletal:  Tender over her left plantar heel. DF/PF 5/5. Ankle ligaments stable.       Assessment:    1. Plantar fasciitis of left foot - Ambulatory referral to Podiatry    Plan:    Further f/u pending podiatry evaluation.

## 2016-08-05 ENCOUNTER — Ambulatory Visit (INDEPENDENT_AMBULATORY_CARE_PROVIDER_SITE_OTHER): Payer: Medicaid Other | Admitting: Vascular Surgery

## 2016-08-12 ENCOUNTER — Ambulatory Visit (INDEPENDENT_AMBULATORY_CARE_PROVIDER_SITE_OTHER): Payer: Medicaid Other | Admitting: Vascular Surgery

## 2016-09-04 ENCOUNTER — Encounter: Payer: Self-pay | Admitting: Family Medicine

## 2016-09-04 ENCOUNTER — Ambulatory Visit (INDEPENDENT_AMBULATORY_CARE_PROVIDER_SITE_OTHER): Payer: Medicaid Other | Admitting: Family Medicine

## 2016-09-04 VITALS — BP 100/80 | HR 81 | Temp 97.8°F | Resp 16 | Wt 219.2 lb

## 2016-09-04 DIAGNOSIS — J0100 Acute maxillary sinusitis, unspecified: Secondary | ICD-10-CM | POA: Diagnosis not present

## 2016-09-04 DIAGNOSIS — J4520 Mild intermittent asthma, uncomplicated: Secondary | ICD-10-CM

## 2016-09-04 MED ORDER — ALBUTEROL SULFATE HFA 108 (90 BASE) MCG/ACT IN AERS: 2.0000 | INHALATION_SPRAY | Freq: Four times a day (QID) | RESPIRATORY_TRACT | 5 refills | Status: DC | PRN

## 2016-09-04 MED ORDER — AMOXICILLIN-POT CLAVULANATE 875-125 MG PO TABS: 1.0000 | ORAL_TABLET | Freq: Two times a day (BID) | ORAL | 0 refills | Status: DC

## 2016-09-04 MED ORDER — HYDROCODONE-HOMATROPINE 5-1.5 MG/5ML PO SYRP: ORAL_SOLUTION | ORAL | 0 refills | Status: DC

## 2016-09-04 NOTE — Progress Notes (Signed)
Subjective:     Patient ID: Ann Morales, female   DOB: [birth year], 41 y.o.   MRN: [id number]  HPI  Chief Complaint  Patient presents with  . URI    Patient comes in office today with complaints of cough and congestion for the past 7 days. Pateint states that cough is productive of yellow phlegm, and she has had pain in chest from cough. Patient reports hat she has been taking otc Mucinex and Nyquil.  Reports cold sx she caught from her young niece. Patient reports increased sinus pressure, purulent post nasal drainage and accompanying cough. + tobacco use. Wishes refill on her albuterol inhaler. Also has unintentionally lost approximately 30# since December though looks and feels well without diabetic sx.   Review of Systems     Objective:   Physical Exam  Constitutional: She appears well-developed and well-nourished. No distress.  Ears: T.M's intact without inflammation Sinuses: mild maxillary sinus tenderness Throat: no tonsillar enlargement or exudate Neck: left anterior cervical tenderness Lungs: clear     Assessment:    1. Acute non-recurrent maxillary sinusitis - HYDROcodone-homatropine (HYCODAN) 5-1.5 MG/5ML syrup; 5 ml 4-6 hours as needed for cough  Dispense: 240 mL; Refill: 0 - amoxicillin-clavulanate (AUGMENTIN) 875-125 MG tablet; Take 1 tablet by mouth 2 (two) times daily.  Dispense: 20 tablet; Refill: 0  2. Mild intermittent asthma without complication - albuterol (PROVENTIL HFA;VENTOLIN HFA) 108 (90 Base) MCG/ACT inhaler; Inhale 2 puffs into the lungs every 6 (six) hours as needed for wheezing or shortness of breath.  Dispense: 1 Inhaler; Refill: 5    Plan:    Discussed use of Mucinex D and Delsym for cough. F/u regarding weight loss when better.

## 2016-09-04 NOTE — Patient Instructions (Signed)
When you are better, come in so we can check your sugar if still losing weight unintentionally. Try Mucinex D for your congestion or Delsym for cough.

## 2016-09-28 ENCOUNTER — Other Ambulatory Visit: Payer: Self-pay | Admitting: Family Medicine

## 2017-02-17 ENCOUNTER — Other Ambulatory Visit: Payer: Self-pay | Admitting: Podiatry

## 2017-02-17 DIAGNOSIS — M79672 Pain in left foot: Secondary | ICD-10-CM

## 2017-02-20 ENCOUNTER — Encounter: Payer: Self-pay | Admitting: Family Medicine

## 2017-02-20 ENCOUNTER — Other Ambulatory Visit: Payer: Self-pay | Admitting: Family Medicine

## 2017-02-20 ENCOUNTER — Ambulatory Visit: Payer: Medicaid Other | Admitting: Family Medicine

## 2017-02-20 VITALS — BP 102/70 | HR 73 | Temp 98.9°F | Resp 16 | Wt 215.2 lb

## 2017-02-20 DIAGNOSIS — R519 Headache, unspecified: Secondary | ICD-10-CM

## 2017-02-20 DIAGNOSIS — R51 Headache: Secondary | ICD-10-CM

## 2017-02-20 DIAGNOSIS — N3090 Cystitis, unspecified without hematuria: Secondary | ICD-10-CM | POA: Diagnosis not present

## 2017-02-20 DIAGNOSIS — K219 Gastro-esophageal reflux disease without esophagitis: Secondary | ICD-10-CM | POA: Diagnosis not present

## 2017-02-20 DIAGNOSIS — M5412 Radiculopathy, cervical region: Secondary | ICD-10-CM | POA: Diagnosis not present

## 2017-02-20 LAB — POCT URINALYSIS DIPSTICK
Glucose, UA: NEGATIVE
Ketones, UA: NEGATIVE
Leukocytes, UA: NEGATIVE
Nitrite, UA: NEGATIVE
Spec Grav, UA: >=1.03 — AB (ref 1.010–1.025)
Urobilinogen, UA: 1 E.U./dL
pH, UA: 5 (ref 5.0–8.0)

## 2017-02-20 MED ORDER — PREDNISONE 20 MG PO TABS: ORAL_TABLET | ORAL | 0 refills | Status: DC

## 2017-02-20 MED ORDER — NITROFURANTOIN MONOHYD MACRO 100 MG PO CAPS: 100.0000 mg | ORAL_CAPSULE | Freq: Two times a day (BID) | ORAL | 0 refills | Status: DC

## 2017-02-20 MED ORDER — OMEPRAZOLE 20 MG PO CPDR: 20.0000 mg | DELAYED_RELEASE_CAPSULE | Freq: Every day | ORAL | 1 refills | Status: DC

## 2017-02-20 NOTE — Progress Notes (Signed)
Subjective:     Patient ID: Ann Morales, female   DOB: 09-20-1975, 41 y.o.   MRN: [id number] Chief Complaint  Patient presents with  . Headache    Patient reports for the past 2 months she has experienced a headache daily, patient states that pain starts at base of neck and radiates to top of head. Patient has tried otc Tylenol/Ibuprofen with no relief.   . Dysuria    Patient comes in with complaints of dysuria,pelvic pain and change in urinary apperance for the past week.    HPI States she is being followed by a [facility] orthopedic surgeon Surgery was recommended for cervical stenosis/radiculopathy. No prior hx of headache syndromes. States headaches start at her right posterior head and can be both pressure and throbbing. She is tender in this area. They can last all day. She will take ibuprofen and Tylenol, put a damp cloth on her head and go in a dark room. Reports maternal history of migraines. Has a mild residual headache today.  Review of Systems     Objective:   Physical Exam  Constitutional: She appears well-developed and well-nourished. No distress.  Eyes: Conjunctivae are normal. Pupils are equal, round, and reactive to light.  Genitourinary:  Genitourinary Comments: No cva tenderness  Musculoskeletal:  Cervical FROM but has moderate tenderness in her right posterior cervical area.  Neurological: Coordination (finger to nose WNL; Romberg negative) normal.       Assessment:    1. Cystitis - POCT urinalysis dipstick - Urine Culture - nitrofurantoin, macrocrystal-monohydrate, (MACROBID) 100 MG capsule; Take 1 capsule (100 mg total) by mouth 2 (two) times daily.  Dispense: 14 capsule; Refill: 0  2. Frequent headaches: ? Cervicogenic - predniSONE (DELTASONE) 20 MG tablet; Taper as follows: 3 pills for 4 days, two pills for 4 days, one pill for four days  Dispense: 24 tablet; Refill: 0  3. Cervical radiculopathy - predniSONE (DELTASONE) 20 MG tablet; Taper as follows: 3  pills for 4 days, two pills for 4 days, one pill for four days  Dispense: 24 tablet; Refill: 0  4. Gastroesophageal reflux disease without esophagitis: also mucosal protection due to frequent use of nsaid's. - omeprazole (PRILOSEC) 20 MG capsule; Take 1 capsule (20 mg total) by mouth daily.  Dispense: 90 capsule; Refill: 1    Plan:    Further f/u pending urine culture. Phone f/u regarding prednisone efficacy.

## 2017-02-20 NOTE — Patient Instructions (Addendum)
We will call you with the urine culture results. Let me know if the prednisone helped.

## 2017-02-21 LAB — URINE CULTURE
MICRO NUMBER:: 81408482
Result:: NO GROWTH
SPECIMEN QUALITY:: ADEQUATE

## 2017-02-23 ENCOUNTER — Telehealth: Payer: Self-pay

## 2017-02-23 NOTE — Telephone Encounter (Signed)
Patient has been advised. KW 

## 2017-02-23 NOTE — Telephone Encounter (Signed)
-----   Message from Carmon Ginsberg, [place] sent at 02/23/2017  7:30 AM EST ----- No growth on urine culture. Repeat urinalysis after you complete your menstrual period.

## 2017-02-25 ENCOUNTER — Ambulatory Visit: Payer: Medicaid Other

## 2017-02-25 DIAGNOSIS — M5010 Cervical disc disorder with radiculopathy, unspecified cervical region: Secondary | ICD-10-CM | POA: Insufficient documentation

## 2017-03-05 ENCOUNTER — Telehealth: Payer: Self-pay | Admitting: *Deleted

## 2017-03-05 NOTE — Telephone Encounter (Signed)
Patient called office requesting an rx for headaches. Patient was seen in office 02/20/2017 for frequent headaches and was given prednisone taper (which did not help). Patient completed prednisone but headaches are not better. Patient stated she went to [facility] last night for headache. Patient states they gave her an IV medication that helped ease off headache but she doesn't remember what the name med is. Patient states the headache has come back today. Patient is requesting a medication to help headaches. Please advise?

## 2017-03-06 ENCOUNTER — Other Ambulatory Visit: Payer: Self-pay | Admitting: Family Medicine

## 2017-03-06 MED ORDER — SUMATRIPTAN SUCCINATE 50 MG PO TABS: ORAL_TABLET | ORAL | 0 refills | Status: DC

## 2017-03-06 NOTE — Telephone Encounter (Signed)
Please advise-Ann Morales V Ann Morales, RMA  

## 2017-03-06 NOTE — Telephone Encounter (Signed)
No answer  Thanks,  -Darren Nodal 

## 2017-03-06 NOTE — Telephone Encounter (Signed)
I have sent in Imitrex to [facility] in [place]. This will help at onset of headache if it is a migraine. If you continue to have frequent headaches, a daily preventative medication is helpful.

## 2017-03-09 ENCOUNTER — Other Ambulatory Visit: Payer: Self-pay | Admitting: Family Medicine

## 2017-03-09 ENCOUNTER — Ambulatory Visit (INDEPENDENT_AMBULATORY_CARE_PROVIDER_SITE_OTHER): Payer: Medicaid Other | Admitting: Family Medicine

## 2017-03-09 ENCOUNTER — Encounter: Payer: Self-pay | Admitting: Family Medicine

## 2017-03-09 VITALS — BP 112/68 | HR 84 | Temp 98.2°F | Resp 16 | Wt 214.0 lb

## 2017-03-09 DIAGNOSIS — R319 Hematuria, unspecified: Secondary | ICD-10-CM | POA: Diagnosis not present

## 2017-03-09 DIAGNOSIS — J029 Acute pharyngitis, unspecified: Secondary | ICD-10-CM | POA: Diagnosis not present

## 2017-03-09 LAB — POCT UA - MICROSCOPIC ONLY
Bacteria, U Microscopic: 0
Casts, Ur, LPF, POC: 0
Crystals, Ur, HPF, POC: 0
Mucus, UA: 0
WBC, Ur, HPF, POC: 0
Yeast, UA: 0

## 2017-03-09 LAB — POCT URINALYSIS DIPSTICK
Bilirubin, UA: NEGATIVE
Glucose, UA: NEGATIVE
Ketones, UA: NEGATIVE
Leukocytes, UA: NEGATIVE
Nitrite, UA: NEGATIVE
Protein, UA: NEGATIVE
Spec Grav, UA: 1.02 (ref 1.010–1.025)
Urobilinogen, UA: 0.2 E.U./dL
pH, UA: 6 (ref 5.0–8.0)

## 2017-03-09 LAB — POCT RAPID STREP A (OFFICE): Rapid Strep A Screen: NEGATIVE

## 2017-03-09 NOTE — Patient Instructions (Signed)
Discussed use of Mucinex D for congestion, Delsym for cough, and Benadryl for postnasal drainage. Good luck with your surgery.

## 2017-03-09 NOTE — Progress Notes (Signed)
Subjective:     Patient ID: Ann Morales, female   DOB: 1975-03-12, 41 y.o.   MRN: [id number] Chief Complaint  Patient presents with  . Sore Throat    Patient c/o sore throat X 3 days. She also reports that she has a lot of congestion, plugged ear sensation, and coughing up thick yellow sputum. She has been taking Tylenol and Mucinex with no relief. She also reports that her daughter has strep and feels that she may have it also.    HPI Has felt feverish.States she has resumed smoking. Also here for repeat U/A due to hematuria with No growth on culture. Recently completed her menses. Denies further dysuria. She is pending cervical surgery this week for a radiculopathy and chronic headache.  Review of Systems     Objective:   Physical Exam  Constitutional: She appears well-developed and well-nourished. No distress.  Ears: T.M's intact without inflammation Throat: no tonsillar enlargement or exudate Neck: no cervical adenopathy Lungs: clear     Assessment:    1. Sore throat; Part of URI prodrome - POCT rapid strep A  2. Hematuria, unspecified type: minimal red cell count on microscopic - POCT urinalysis dipstick - POCT UA - Microscopic Only    Plan:    Discussed use of Mucinex D for congestion, Delsym for cough, and Benadryl for postnasal drainage. Further f/u after surgery.

## 2017-03-09 NOTE — Telephone Encounter (Signed)
Patient has OV today will advise then.

## 2017-03-10 HISTORY — PX: BACK SURGERY: SHX140

## 2017-03-13 DIAGNOSIS — M4322 Fusion of spine, cervical region: Secondary | ICD-10-CM | POA: Insufficient documentation

## 2017-05-15 ENCOUNTER — Ambulatory Visit
Admission: RE | Admit: 2017-05-15 | Discharge: 2017-05-15 | Disposition: A | Discharge status: Home / Self Care | Payer: Medicaid Other | Source: Ambulatory Visit | Attending: Podiatry | Admitting: Podiatry

## 2017-05-15 DIAGNOSIS — M62572 Muscle wasting and atrophy, not elsewhere classified, left ankle and foot: Secondary | ICD-10-CM | POA: Insufficient documentation

## 2017-05-15 DIAGNOSIS — R600 Localized edema: Secondary | ICD-10-CM | POA: Insufficient documentation

## 2017-05-15 DIAGNOSIS — M79672 Pain in left foot: Secondary | ICD-10-CM

## 2017-05-15 DIAGNOSIS — X58XXXA Exposure to other specified factors, initial encounter: Secondary | ICD-10-CM | POA: Insufficient documentation

## 2017-05-15 DIAGNOSIS — S86312A Strain of muscle(s) and tendon(s) of peroneal muscle group at lower leg level, left leg, initial encounter: Secondary | ICD-10-CM | POA: Diagnosis not present

## 2017-05-15 DIAGNOSIS — M25572 Pain in left ankle and joints of left foot: Secondary | ICD-10-CM | POA: Diagnosis present

## 2017-06-04 ENCOUNTER — Ambulatory Visit: Payer: Medicaid Other | Admitting: Family Medicine

## 2017-06-04 ENCOUNTER — Inpatient Hospital Stay: Admission: RE | Admit: 2017-06-04 | Payer: Medicaid Other | Source: Ambulatory Visit

## 2017-06-04 ENCOUNTER — Encounter: Payer: Self-pay | Admitting: Family Medicine

## 2017-06-04 VITALS — BP 106/70 | HR 70 | Temp 98.6°F | Resp 16 | Ht 64.0 in | Wt 210.0 lb

## 2017-06-04 DIAGNOSIS — Z01818 Encounter for other preprocedural examination: Secondary | ICD-10-CM | POA: Diagnosis not present

## 2017-06-04 NOTE — Patient Instructions (Signed)
Good luck with your surgery!

## 2017-06-04 NOTE — Progress Notes (Signed)
.  Subjective:     Patient ID: Ann Morales, female   DOB: 06-14-1975, 42 y.o.   MRN: [id number] Chief Complaint  Patient presents with  . Pre-op Exam    Patient comes in office today for pre-op, she is scheduled for surgery 06/12/17 to repair torn ligaments in the left foot.    HPI States she feels well and has recovered from cervical fusion in January. Not needing albuterol except when she is ill. Has resumed smoking. Mental health stable.  Review of Systems     Objective:   Physical Exam  Constitutional: She appears well-developed and well-nourished. No distress.  Eyes: PERRLA, Neck: no cervical adenopathy  ENT: TM's intact without inflammation; No tonsillar enlargement or exudate, Lungs: Clear Heart : RRR without murmur or gallop Abd: bowel sounds present, soft, non-tender, no organomegaly Extremities: no edema Skin: no rashes noted..    Assessment:    1. Pre-op testing - EKG 12-Lead  2. Pre-operative exam: medically stable    Plan:    Pre-op form completed. EKG with bradycardia at 56 but waveforms the same as 2015 EKG. Copies of both EKG's provided for anaesthesioloigst/surgeon's review.

## 2017-06-05 ENCOUNTER — Inpatient Hospital Stay: Admission: RE | Admit: 2017-06-05 | Payer: Medicaid Other | Source: Ambulatory Visit

## 2017-06-09 ENCOUNTER — Encounter
Admission: RE | Admit: 2017-06-09 | Discharge: 2017-06-09 | Disposition: A | Discharge status: Home / Self Care | Payer: Medicaid Other | Source: Ambulatory Visit | Attending: Podiatry | Admitting: Podiatry

## 2017-06-09 ENCOUNTER — Other Ambulatory Visit: Payer: Self-pay

## 2017-06-09 ENCOUNTER — Other Ambulatory Visit: Payer: Self-pay | Admitting: Podiatry

## 2017-06-09 DIAGNOSIS — Z01818 Encounter for other preprocedural examination: Secondary | ICD-10-CM | POA: Insufficient documentation

## 2017-06-09 HISTORY — DX: Headache, unspecified: R51.9

## 2017-06-09 HISTORY — DX: Unspecified osteoarthritis, unspecified site: M19.90

## 2017-06-09 HISTORY — DX: Sleep apnea, unspecified: G47.30

## 2017-06-09 HISTORY — DX: Unspecified asthma, uncomplicated: J45.909

## 2017-06-09 HISTORY — DX: Headache: R51

## 2017-06-09 HISTORY — DX: Gastro-esophageal reflux disease without esophagitis: K21.9

## 2017-06-09 NOTE — Patient Instructions (Signed)
Your procedure is scheduled on: 06/12/17 Report to Day Surgery. [facility]OND FLOOR To find out your arrival time please call [phone] between 1PM - 3PM on 06/11/17.  Remember: Instructions that are not followed completely may result in serious medical risk, up to and including death, or upon the discretion of your surgeon and anesthesiologist your surgery may need to be rescheduled.     _X__ 1. Do not eat food after midnight the night before your procedure.                 No gum chewing or hard candies. You may drink clear liquids up to 2 hours                 before you are scheduled to arrive for your surgery- DO not drink clear                 liquids within 2 hours of the start of your surgery.                 Clear Liquids include:  water, apple juice without pulp, clear carbohydrate                 drink such as Clearfast of Gartorade, Black Coffee or Tea (Do not add                 anything to coffee or tea).  __X__2.  On the morning of surgery brush your teeth with toothpaste and water, you                 may rinse your mouth with mouthwash if you wish.  Do not swallow any              toothpaste of mouthwash.     _X__ 3.  No Alcohol for 24 hours before or after surgery.   _X__ 4.  Do Not Smoke or use e-cigarettes For 24 Hours Prior to Your Surgery.                 Do not use any chewable tobacco products for at least 6 hours prior to                 surgery.  ____  5.  Bring all medications with you on the day of surgery if instructed.   _X___  6.  Notify your doctor if there is any change in your medical condition      (cold, fever, infections).     Do not wear jewelry, make-up, hairpins, clips or nail polish. Do not wear lotions, powders, or perfumes. You may wear deodorant. Do not shave 48 hours prior to surgery. Men may shave face and neck. Do not bring valuables to the hospital.    [facility] is not responsible for any belongings or  valuables.  Contacts, dentures or bridgework may not be worn into surgery. Leave your suitcase in the car. After surgery it may be brought to your room. For patients admitted to the hospital, discharge time is determined by your treatment team.   Patients discharged the day of surgery will not be allowed to drive home.   Please read over the following fact sheets that you were given:   Surgical Site Infection Prevention   __X__ Take these medicines the morning of surgery with A SIP OF WATER:    1. BUPROPION  2. CLONAZEPAM  3.LITHIUM   4. TRINTELLIX  5. OMEPRAZOLE AT BEDTIME 06/11/17  AND AM SURGERY  6.  ____ Fleet Enema (as directed)   __X__ Use CHG Soap as directed  __X__ Use inhalers on the day of surgery  ____ Stop metformin 2 days prior to surgery    ____ Take 1/2 of usual insulin dose the night before surgery. No insulin the morning          of surgery.   ____ Stop Coumadin/Plavix/aspirin on  __X__ Stop Anti-inflammatories on   STOP ADVIL NOW UNTIL AFTER SURGERY   ____ Stop supplements until after surgery.    __X__ Bring C-Pap to the hospital.

## 2017-06-11 MED ORDER — CEFAZOLIN SODIUM-DEXTROSE 2-4 GM/100ML-% IV SOLN
2.0000 g | INTRAVENOUS | Status: AC
  Administered 2017-06-12: 2 g via INTRAVENOUS

## 2017-06-12 ENCOUNTER — Other Ambulatory Visit: Payer: Self-pay

## 2017-06-12 ENCOUNTER — Encounter
Admission: RE | Disposition: A | Discharge status: Home / Self Care | Payer: Self-pay | Source: Ambulatory Visit | Attending: Podiatry

## 2017-06-12 ENCOUNTER — Encounter: Payer: Self-pay | Admitting: *Deleted

## 2017-06-12 ENCOUNTER — Ambulatory Visit: Payer: Medicaid Other | Admitting: Anesthesiology

## 2017-06-12 ENCOUNTER — Ambulatory Visit
Admission: RE | Admit: 2017-06-12 | Discharge: 2017-06-12 | Disposition: A | Discharge status: Home / Self Care | Payer: Medicaid Other | Source: Ambulatory Visit | Attending: Podiatry | Admitting: Podiatry

## 2017-06-12 DIAGNOSIS — X58XXXA Exposure to other specified factors, initial encounter: Secondary | ICD-10-CM | POA: Insufficient documentation

## 2017-06-12 DIAGNOSIS — G43909 Migraine, unspecified, not intractable, without status migrainosus: Secondary | ICD-10-CM | POA: Diagnosis not present

## 2017-06-12 DIAGNOSIS — M722 Plantar fascial fibromatosis: Secondary | ICD-10-CM | POA: Insufficient documentation

## 2017-06-12 DIAGNOSIS — Z87891 Personal history of nicotine dependence: Secondary | ICD-10-CM | POA: Insufficient documentation

## 2017-06-12 DIAGNOSIS — J45909 Unspecified asthma, uncomplicated: Secondary | ICD-10-CM | POA: Diagnosis not present

## 2017-06-12 DIAGNOSIS — K219 Gastro-esophageal reflux disease without esophagitis: Secondary | ICD-10-CM | POA: Insufficient documentation

## 2017-06-12 DIAGNOSIS — F319 Bipolar disorder, unspecified: Secondary | ICD-10-CM | POA: Diagnosis not present

## 2017-06-12 DIAGNOSIS — G4733 Obstructive sleep apnea (adult) (pediatric): Secondary | ICD-10-CM | POA: Insufficient documentation

## 2017-06-12 DIAGNOSIS — M7672 Peroneal tendinitis, left leg: Secondary | ICD-10-CM | POA: Insufficient documentation

## 2017-06-12 DIAGNOSIS — Z79899 Other long term (current) drug therapy: Secondary | ICD-10-CM | POA: Insufficient documentation

## 2017-06-12 DIAGNOSIS — F419 Anxiety disorder, unspecified: Secondary | ICD-10-CM | POA: Insufficient documentation

## 2017-06-12 DIAGNOSIS — S96812A Strain of other specified muscles and tendons at ankle and foot level, left foot, initial encounter: Secondary | ICD-10-CM | POA: Insufficient documentation

## 2017-06-12 DIAGNOSIS — I739 Peripheral vascular disease, unspecified: Secondary | ICD-10-CM | POA: Diagnosis not present

## 2017-06-12 DIAGNOSIS — G5752 Tarsal tunnel syndrome, left lower limb: Secondary | ICD-10-CM | POA: Insufficient documentation

## 2017-06-12 DIAGNOSIS — G588 Other specified mononeuropathies: Secondary | ICD-10-CM | POA: Insufficient documentation

## 2017-06-12 DIAGNOSIS — F909 Attention-deficit hyperactivity disorder, unspecified type: Secondary | ICD-10-CM | POA: Diagnosis not present

## 2017-06-12 DIAGNOSIS — Z791 Long term (current) use of non-steroidal anti-inflammatories (NSAID): Secondary | ICD-10-CM | POA: Diagnosis not present

## 2017-06-12 HISTORY — PX: PLANTAR FASCIA RELEASE: SHX2239

## 2017-06-12 HISTORY — PX: REPAIR EXTENSOR TENDON: SHX5382

## 2017-06-12 HISTORY — PX: TARSAL TUNNEL RELEASE: SHX5042

## 2017-06-12 LAB — POCT PREGNANCY, URINE: Preg Test, Ur: NEGATIVE

## 2017-06-12 SURGERY — FASCIOTOMY, PLANTAR, ENDOSCOPIC
Anesthesia: General | Site: Foot | Laterality: Left | Wound class: "Clean "

## 2017-06-12 MED ORDER — BUPIVACAINE LIPOSOME 1.3 % IJ SUSP
INTRAMUSCULAR | Status: AC
  Filled 2017-06-12: qty 20

## 2017-06-12 MED ORDER — DEXMEDETOMIDINE HCL 200 MCG/2ML IV SOLN
INTRAVENOUS | Status: DC | PRN
  Administered 2017-06-12: 12 ug via INTRAVENOUS

## 2017-06-12 MED ORDER — ONDANSETRON HCL 4 MG/2ML IJ SOLN
INTRAMUSCULAR | Status: DC | PRN
  Administered 2017-06-12: 4 mg via INTRAVENOUS

## 2017-06-12 MED ORDER — OXYCODONE-ACETAMINOPHEN 5-325 MG PO TABS
ORAL_TABLET | ORAL | Status: AC
  Administered 2017-06-12: 1 via ORAL
  Filled 2017-06-12: qty 1

## 2017-06-12 MED ORDER — LIDOCAINE HCL (PF) 1 % IJ SOLN
INTRAMUSCULAR | Status: AC
  Filled 2017-06-12: qty 30

## 2017-06-12 MED ORDER — LACTATED RINGERS IV SOLN
INTRAVENOUS | Status: DC
  Administered 2017-06-12 (×2): via INTRAVENOUS

## 2017-06-12 MED ORDER — LIDOCAINE 2% (20 MG/ML) 5 ML SYRINGE
INTRAMUSCULAR | Status: DC | PRN
  Administered 2017-06-12: 100 mg via INTRAVENOUS

## 2017-06-12 MED ORDER — POVIDONE-IODINE 7.5 % EX SOLN
Freq: Once | CUTANEOUS | Status: DC
  Filled 2017-06-12: qty 118

## 2017-06-12 MED ORDER — PROPOFOL 10 MG/ML IV BOLUS
INTRAVENOUS | Status: DC | PRN
  Administered 2017-06-12: 150 mg via INTRAVENOUS

## 2017-06-12 MED ORDER — ONDANSETRON HCL 4 MG/2ML IJ SOLN: 4.0000 mg | Freq: Four times a day (QID) | INTRAMUSCULAR | Status: DC | PRN

## 2017-06-12 MED ORDER — DEXAMETHASONE SODIUM PHOSPHATE 10 MG/ML IJ SOLN
INTRAMUSCULAR | Status: DC | PRN
  Administered 2017-06-12: 10 mg via INTRAVENOUS

## 2017-06-12 MED ORDER — OXYCODONE-ACETAMINOPHEN 5-325 MG PO TABS: 1.0000 | ORAL_TABLET | ORAL | 0 refills | Status: DC | PRN

## 2017-06-12 MED ORDER — ONDANSETRON HCL 4 MG PO TABS: 4.0000 mg | ORAL_TABLET | Freq: Four times a day (QID) | ORAL | Status: DC | PRN

## 2017-06-12 MED ORDER — ROCURONIUM BROMIDE 50 MG/5ML IV SOLN
INTRAVENOUS | Status: AC
Start: 2017-06-12 — End: ?
  Filled 2017-06-12: qty 1

## 2017-06-12 MED ORDER — MIDAZOLAM HCL 2 MG/2ML IJ SOLN
INTRAMUSCULAR | Status: DC | PRN
  Administered 2017-06-12: 2 mg via INTRAVENOUS

## 2017-06-12 MED ORDER — FENTANYL CITRATE (PF) 100 MCG/2ML IJ SOLN: 25.0000 ug | INTRAMUSCULAR | Status: DC | PRN

## 2017-06-12 MED ORDER — BUPIVACAINE HCL (PF) 0.25 % IJ SOLN
INTRAMUSCULAR | Status: DC | PRN
  Administered 2017-06-12: 20 mL

## 2017-06-12 MED ORDER — SUCCINYLCHOLINE CHLORIDE 20 MG/ML IJ SOLN
INTRAMUSCULAR | Status: DC | PRN
  Administered 2017-06-12: 100 mg via INTRAVENOUS

## 2017-06-12 MED ORDER — ONDANSETRON HCL 4 MG/2ML IJ SOLN
INTRAMUSCULAR | Status: AC
  Filled 2017-06-12: qty 2

## 2017-06-12 MED ORDER — PHENYLEPHRINE HCL 10 MG/ML IJ SOLN
INTRAMUSCULAR | Status: DC | PRN
  Administered 2017-06-12 (×2): 80 ug via INTRAVENOUS
  Administered 2017-06-12: 40 ug via INTRAVENOUS

## 2017-06-12 MED ORDER — MIDAZOLAM HCL 2 MG/2ML IJ SOLN
INTRAMUSCULAR | Status: AC
  Filled 2017-06-12: qty 2

## 2017-06-12 MED ORDER — CEFAZOLIN SODIUM-DEXTROSE 2-4 GM/100ML-% IV SOLN
INTRAVENOUS | Status: AC
  Filled 2017-06-12: qty 100

## 2017-06-12 MED ORDER — SUGAMMADEX SODIUM 200 MG/2ML IV SOLN
INTRAVENOUS | Status: AC
Start: 2017-06-12 — End: ?
  Filled 2017-06-12: qty 2

## 2017-06-12 MED ORDER — FENTANYL CITRATE (PF) 100 MCG/2ML IJ SOLN
INTRAMUSCULAR | Status: AC
  Filled 2017-06-12: qty 2

## 2017-06-12 MED ORDER — LIDOCAINE HCL (PF) 2 % IJ SOLN
INTRAMUSCULAR | Status: AC
  Filled 2017-06-12: qty 10

## 2017-06-12 MED ORDER — BUPIVACAINE-EPINEPHRINE (PF) 0.25% -1:200000 IJ SOLN
INTRAMUSCULAR | Status: AC
  Filled 2017-06-12: qty 30

## 2017-06-12 MED ORDER — OXYCODONE-ACETAMINOPHEN 5-325 MG PO TABS
1.0000 | ORAL_TABLET | ORAL | Status: DC | PRN
  Administered 2017-06-12: 1 via ORAL

## 2017-06-12 MED ORDER — ROCURONIUM BROMIDE 100 MG/10ML IV SOLN
INTRAVENOUS | Status: DC | PRN
  Administered 2017-06-12: 5 mg via INTRAVENOUS

## 2017-06-12 MED ORDER — BUPIVACAINE LIPOSOME 1.3 % IJ SUSP
INTRAMUSCULAR | Status: DC | PRN
  Administered 2017-06-12: 20 mL

## 2017-06-12 MED ORDER — SUCCINYLCHOLINE CHLORIDE 20 MG/ML IJ SOLN
INTRAMUSCULAR | Status: AC
  Filled 2017-06-12: qty 1

## 2017-06-12 MED ORDER — BUPIVACAINE HCL (PF) 0.25 % IJ SOLN
INTRAMUSCULAR | Status: AC
  Filled 2017-06-12: qty 30

## 2017-06-12 MED ORDER — FENTANYL CITRATE (PF) 100 MCG/2ML IJ SOLN
INTRAMUSCULAR | Status: DC | PRN
  Administered 2017-06-12: 100 ug via INTRAVENOUS

## 2017-06-12 MED ORDER — ONDANSETRON HCL 4 MG/2ML IJ SOLN: 4.0000 mg | Freq: Once | INTRAMUSCULAR | Status: DC | PRN

## 2017-06-12 MED ORDER — ARTIFICIAL TEARS OPHTHALMIC OINT
TOPICAL_OINTMENT | OPHTHALMIC | Status: AC
  Filled 2017-06-12: qty 3.5

## 2017-06-12 MED ORDER — CHLORHEXIDINE GLUCONATE 4 % EX LIQD: 60.0000 mL | Freq: Once | CUTANEOUS | Status: DC

## 2017-06-12 MED ORDER — DEXAMETHASONE SODIUM PHOSPHATE 10 MG/ML IJ SOLN
INTRAMUSCULAR | Status: AC
  Filled 2017-06-12: qty 1

## 2017-06-12 MED ORDER — PROPOFOL 10 MG/ML IV BOLUS
INTRAVENOUS | Status: AC
Start: 2017-06-12 — End: ?
  Filled 2017-06-12: qty 20

## 2017-06-12 MED ORDER — GLYCOPYRROLATE 0.2 MG/ML IJ SOLN
INTRAMUSCULAR | Status: DC | PRN
  Administered 2017-06-12: 0.2 mg via INTRAVENOUS

## 2017-06-12 SURGICAL SUPPLY — 88 items
APPLICATOR COTTON TIP 6IN STRL (MISCELLANEOUS) ×6 IMPLANT
BANDAGE ELASTIC 4 LF NS (GAUZE/BANDAGES/DRESSINGS) ×4 IMPLANT
BENZOIN TINCTURE PRP APPL 2/3 (GAUZE/BANDAGES/DRESSINGS) IMPLANT
BLADE ENDOTRAC PUSH EPF/EGR (MISCELLANEOUS) IMPLANT
BLADE SURG 15 STRL LF DISP TIS (BLADE) ×2 IMPLANT
BLADE SURG 15 STRL SS (BLADE) ×2
BLADE SURG MINI STRL (BLADE) ×2 IMPLANT
BLADE TRIANGLE EPF/EGR ENDO (BLADE) ×2 IMPLANT
BNDG COHESIVE 4X5 TAN STRL (GAUZE/BANDAGES/DRESSINGS) ×2 IMPLANT
BNDG CONFORM 3 STRL LF (GAUZE/BANDAGES/DRESSINGS) ×1 IMPLANT
BNDG ESMARK 4X12 TAN STRL LF (GAUZE/BANDAGES/DRESSINGS) ×2 IMPLANT
BNDG ESMARK 6X12 TAN STRL LF (GAUZE/BANDAGES/DRESSINGS) ×2 IMPLANT
BNDG GAUZE 4.5X4.1 6PLY STRL (MISCELLANEOUS) ×2 IMPLANT
BNDG STRETCH 4X75 STRL LF (GAUZE/BANDAGES/DRESSINGS) ×2 IMPLANT
CANISTER SUCT 1200ML W/VALVE (MISCELLANEOUS) ×2 IMPLANT
CUFF DUAL TOURNIQUET 18IN DISP (TOURNIQUET CUFF) IMPLANT
CUFF TOURN 24 STER (MISCELLANEOUS) ×1 IMPLANT
DRAPE FLUOR MINI C-ARM 54X84 (DRAPES) ×1 IMPLANT
DURAPREP 26ML APPLICATOR (WOUND CARE) ×2 IMPLANT
ELECT REM PT RETURN 9FT ADLT (ELECTROSURGICAL) ×2
ELECTRODE REM PT RTRN 9FT ADLT (ELECTROSURGICAL) ×1 IMPLANT
GAUZE PETRO XEROFOAM 1X8 (MISCELLANEOUS) ×2 IMPLANT
GAUZE PETRO XEROFOAM 5X9 (MISCELLANEOUS) ×2 IMPLANT
GAUZE SPONGE 4X4 12PLY STRL (GAUZE/BANDAGES/DRESSINGS) ×2 IMPLANT
GAUZE STRETCH 2X75IN STRL (MISCELLANEOUS) ×2 IMPLANT
GLOVE BIO SURGEON STRL SZ7.5 (GLOVE) ×5 IMPLANT
GLOVE INDICATOR 8.0 STRL GRN (GLOVE) ×5 IMPLANT
GOWN STRL REUS W/ TWL LRG LVL3 (GOWN DISPOSABLE) ×2 IMPLANT
GOWN STRL REUS W/TWL LRG LVL3 (GOWN DISPOSABLE) ×3
HANDLE YANKAUER SUCT BULB TIP (MISCELLANEOUS) ×2 IMPLANT
IV NS 250ML (IV SOLUTION) ×1
IV NS 250ML BAXH (IV SOLUTION) ×1 IMPLANT
K-WIRE DBL END TROCAR 6X.062 (WIRE)
KIT TURNOVER KIT A (KITS) ×2 IMPLANT
KWIRE DBL END TROCAR 6X.062 (WIRE) IMPLANT
LABEL OR SOLS (LABEL) ×2 IMPLANT
NDL FILTER BLUNT 18X1 1/2 (NEEDLE) ×1 IMPLANT
NDL HYPO 25GX1X1/2 BEV (NEEDLE) IMPLANT
NDL HYPO 25X1 1.5 SAFETY (NEEDLE) ×3 IMPLANT
NDL MAYO CATGUT SZ5 (NEEDLE)
NDL SAFETY ECLIPSE 18X1.5 (NEEDLE) IMPLANT
NDL SUT 5 .5 CRC TPR PNT MAYO (NEEDLE) ×1 IMPLANT
NEEDLE FILTER BLUNT 18X 1/2SAF (NEEDLE) ×1
NEEDLE FILTER BLUNT 18X1 1/2 (NEEDLE) ×1 IMPLANT
NEEDLE HYPO 18GX1.5 SHARP (NEEDLE)
NEEDLE HYPO 25GX1X1/2 BEV (NEEDLE) ×2 IMPLANT
NEEDLE HYPO 25X1 1.5 SAFETY (NEEDLE) IMPLANT
NS IRRIG 500ML POUR BTL (IV SOLUTION) ×2 IMPLANT
PACK EXTREMITY ARMC (MISCELLANEOUS) ×2 IMPLANT
PAD CAST CTTN 4X4 STRL (SOFTGOODS) ×1 IMPLANT
PADDING CAST COTTON 4X4 STRL (SOFTGOODS) ×1
RASP SM TEAR CROSS CUT (RASP) ×2 IMPLANT
SOL ANTI-FOG 6CC FOG-OUT (MISCELLANEOUS) ×1 IMPLANT
SOL FOG-OUT ANTI-FOG 6CC (MISCELLANEOUS) ×1
SPLINT CAST 1 STEP 4X30 (MISCELLANEOUS) IMPLANT
SPLINT CAST 1 STEP 5X30 WHT (MISCELLANEOUS) ×1 IMPLANT
SPLINT FAST PLASTER 5X30 (CAST SUPPLIES)
SPLINT PLASTER CAST FAST 5X30 (CAST SUPPLIES) ×1 IMPLANT
SPONGE LAP 18X18 5 PK (GAUZE/BANDAGES/DRESSINGS) ×1 IMPLANT
STOCKINETTE IMPERV 14X48 (MISCELLANEOUS) ×2 IMPLANT
STOCKINETTE M/LG 89821 (MISCELLANEOUS) ×2 IMPLANT
STRIP CLOSURE SKIN 1/2X4 (GAUZE/BANDAGES/DRESSINGS) ×1 IMPLANT
STRIP CLOSURE SKIN 1/4X4 (GAUZE/BANDAGES/DRESSINGS) IMPLANT
SUT ETHILON 3-0 FS-10 30 BLK (SUTURE) ×2
SUT ETHILON 4-0 (SUTURE) ×1
SUT ETHILON 4-0 FS2 18XMFL BLK (SUTURE) ×1
SUT ETHILON 5-0 FS-2 18 BLK (SUTURE) ×1 IMPLANT
SUT MNCRL+ 5-0 VIOLET P-3 (SUTURE) ×1 IMPLANT
SUT MONOCRYL 5-0 (SUTURE)
SUT PDS AB 0 CT1 27 (SUTURE) IMPLANT
SUT VIC AB 0 SH 27 (SUTURE) ×1 IMPLANT
SUT VIC AB 2-0 SH 27 (SUTURE)
SUT VIC AB 2-0 SH 27XBRD (SUTURE) ×2 IMPLANT
SUT VIC AB 3-0 SH 27 (SUTURE) ×1
SUT VIC AB 3-0 SH 27X BRD (SUTURE) ×1 IMPLANT
SUT VIC AB 4-0 FS2 27 (SUTURE) ×2 IMPLANT
SUT VIC AB 4-0 RB1 27 (SUTURE) ×1
SUT VIC AB 4-0 RB1 27X BRD (SUTURE) IMPLANT
SUT VICRYL AB 3-0 FS1 BRD 27IN (SUTURE) ×1 IMPLANT
SUTURE EHLN 3-0 FS-10 30 BLK (SUTURE) IMPLANT
SUTURE ETHLN 4-0 FS2 18XMF BLK (SUTURE) IMPLANT
SWABSTK COMLB BENZOIN TINCTURE (MISCELLANEOUS) ×1 IMPLANT
SYR 10ML LL (SYRINGE) ×4 IMPLANT
SYR 3ML LL SCALE MARK (SYRINGE) ×1 IMPLANT
SYR 5ML LL (SYRINGE) IMPLANT
WAND TENDON TOPAZ 0 ANGL (MISCELLANEOUS) ×2 IMPLANT
WAND TOPAZ MICRO DEBRIDER (MISCELLANEOUS) IMPLANT
WIRE MAGNUM (SUTURE) ×1 IMPLANT

## 2017-06-12 NOTE — Transfer of Care (Signed)
Immediate Anesthesia Transfer of Care Note  Patient: Ann Morales  Procedure(s) Performed: ENDOSCOPIC PLANTAR FASCIOTOMY-RELEASE (Left Foot) TARSAL TUNNEL RELEASE-BAXTER RELEASE (Left Foot) REPAIR FLEXOR TENDON (Left Foot)  Patient Location: PACU  Anesthesia Type:General  Level of Consciousness: awake, alert  and oriented  Airway & Oxygen Therapy: Patient Spontanous Breathing and Patient connected to face mask oxygen  Post-op Assessment: Report given to RN and Post -op Vital signs reviewed and stable  Post vital signs: Reviewed and stable  Last Vitals:  Vitals Value Taken Time  BP    Temp    Pulse 58 06/12/2017 12:09 PM  Resp 12 06/12/2017 12:09 PM  SpO2 100 % 06/12/2017 12:09 PM  Vitals shown include unvalidated device data.  Last Pain:  Vitals:   06/12/17 0930  TempSrc: Oral  PainSc: 5          Complications: No apparent anesthesia complications

## 2017-06-12 NOTE — Anesthesia Postprocedure Evaluation (Signed)
Anesthesia Post Note  Patient: Ann Morales  Procedure(s) Performed: ENDOSCOPIC PLANTAR FASCIOTOMY-RELEASE (Left Foot) TARSAL TUNNEL RELEASE-BAXTER RELEASE (Left Foot) REPAIR FLEXOR TENDON (Left Foot)  Patient location during evaluation: PACU Anesthesia Type: General Level of consciousness: awake and alert and oriented Pain management: pain level controlled Vital Signs Assessment: post-procedure vital signs reviewed and stable Respiratory status: spontaneous breathing Cardiovascular status: blood pressure returned to baseline Anesthetic complications: no     Last Vitals:  Vitals:   06/12/17 1357 06/12/17 1403  BP: 95/63 92/64  Pulse:    Resp: 16   Temp: 36.7 C   SpO2: 98%     Last Pain:  Vitals:   06/12/17 1357  TempSrc:   PainSc: 2                  Erma Raiche

## 2017-06-12 NOTE — Op Note (Signed)
Operative note   Surgeon:Dillion Stowers    Assistant:Naomi Orrin Brigham PA-S    Preop diagnosis: 1.  Plantar fasciitis left heel to distal tarsal tunnel entrapment 2.Baxters nerve entrapment left heel 3. peroneal brevis and longus tendinitis    Postop diagnosis: 1.  Plan fasciitis left heel 2.  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  Peroneal brevis tear 4.  Peroneal longus tendinitis    Procedure: 1.  Endoscopic plantar fasciotomy left heel 2. 2.  Distal tarsal tunnel release with Baxter's nerve release 3.  Peroneal brevis tendon repair 4.  Peroneal longus tenosynovectomy and Tenolysis all left ankle and foot    EBL: Minimal    Anesthesia:general with local.  Local consisted of a one-to-one mixture of 0.25% bupivacaine and Exparel long-acting anesthetic.  A total of 40 cc was used in a field block    Hemostasis: Thigh tourniquet inflated to 250 mmHg for approximately 70 minutes    Specimen: Peroneal brevis tendon tear    Complications: None    Operative indications:Ann Morales is an 42 y.o. that presents today for surgical intervention.  The risks/benefits/alternatives/complications have been discussed and consent has been given.    Procedure:  Patient was brought into the OR and placed on the operating table in thesupine position. After anesthesia was obtained theleft lower extremity was prepped and draped in usual sterile fashion.  Attention was initially directed to the medial aspect of the left heel where a plantar medial incision was made at the level of the plantar fascial insertion.  Sharp and blunt dissection carried down to the plantar fascia.  Plantar fascial elevator was then introduced.  The blunt trocar and cannula was then placed just superficial to the plantar fascia.  This was placed from medial to lateral and exited the lateral aspect of the heel with a small stab incision.  Next the endoscope was used and the plantar fascial ligament  was visualized.  The medial 1/3-1/2 of the ligament was then transected.  The deep muscle belly was noted at this time.  The wound was flushed with copious amounts of irrigation.  At this time the blunt trocar and cannula was then removed.  The medial incision was then lengthened proximal and distal.  The superficial fascia was noted and transected.  The medial muscle belly was noted in retracted throughout the procedure.  The deep fascial layer was found in transected with a 15 blade.  The deeper subcutaneous tissue along the Baxters nerve region was found and all tissue was released to the area.  The wound was flushed with copious amounts of irrigation.  Closure was performed with a 3-0 Vicryl for the subcutaneous tissue and a 3-0 nylon for skin.  The lateral stab incision was closed with a 3-0 nylon as well.  Attention was directed to the lateral aspect of the ankle where a longitudinal incision was performed along the peroneal tendon region.  This was taken from just proximal to the fibular malleolus to the fifth metatarsal base region.  Sharp and blunt dissection carried down to the peroneal retinaculum.  The tendon sheath was noted and entered.  The peroneal retinaculum was then incised and this was carried to just posterior to the fibula.  At this time the peroneal brevis was inspected and a small tear at the level of the peroneal notch was found.  This was debrided with a scissor and 15 blade.  Mild mucinous material was found in the central aspect.  This  was all removed.  The peroneal longus was evaluated and just had synovitis along the tendon sheath itself.  The peroneal brevis tear was then reanastomosed and re-tubularized with a 3-0 Vicryl.  The tendinitis and fibrotic tissue was removed with a 15 blade and scissors from the peroneal sheath.  Both the peroneal brevis and longus was then infiltrated with a Topaz wand.  The wound was flushed with copious amounts of irrigation.  The tendon sheath with  reanastomosed with 3-0 Vicryl.  The peroneal retinaculum was reanastomosed in a pants over vest fashion with a 3-0 Vicryl.  Subcutaneous tissue was closed with 3-0 Vicryl and the skin closed with 3-0 nylon.  A large bulky sterile dressing was then applied.  Patient was then placed in a posterior splint at 90 degrees.    Patient tolerated the procedure and anesthesia well.  Was transported from the OR to the PACU with all vital signs stable and vascular status intact. To be discharged per routine protocol.  Will follow up in approximately 1 week in the outpatient clinic.

## 2017-06-12 NOTE — Discharge Instructions (Signed)
[place] [address] [place]   1. Take your medication as prescribed.  Pain medication should be taken only as needed.  2. Keep the dressing clean, dry and intact.  3. Keep your foot elevated above the heart level for the first 48 hours.  4. We have instructed you to be non-weight bearing.  5. Always wear your post-op shoe when walking.  Always use your crutches if you are to be non-weight bearing.  6. Do not take a shower. Baths are permissible as long as the foot is kept out of the water.   7. Every hour you are awake:  - Bend your knee 15 times.  8. Call [facility] [phone]) if any of the following problems occur: - You develop a temperature or fever. - The bandage becomes saturated with blood. - Medication does not stop your pain. - Injury of the foot occurs. - Any symptoms of infection including redness, odor, or red streaks running from wound.  AMBULATORY SURGERY  DISCHARGE INSTRUCTIONS   1) The drugs that you were given will stay in your system until tomorrow so for the next 24 hours you should not:  A) Drive an automobile B) Make any legal decisions C) Drink any alcoholic beverage   2) You may resume regular meals tomorrow.  Today it is better to start with liquids and gradually work up to solid foods.  You may eat anything you prefer, but it is better to start with liquids, then soup and crackers, and gradually work up to solid foods.   3) Please notify your doctor immediately if you have any unusual bleeding, trouble breathing, redness and pain at the surgery site, drainage, fever, or pain not relieved by medication.    4) Additional Instructions:        Please contact your physician with any problems or Same Day Surgery at [phone], Monday through Friday 6 am to 4 pm, or [place] at [facility] number at  [phone].

## 2017-06-12 NOTE — OR Nursing (Signed)
Discharge instructions discussed with pt and family. Both voice understanding. 

## 2017-06-12 NOTE — Anesthesia Preprocedure Evaluation (Signed)
Anesthesia Evaluation  Patient identified by MRN, date of birth, ID band Patient awake    Reviewed: Allergy & Precautions, NPO status , Patient's Chart, lab work & pertinent test results  Airway Mallampati: III  TM Distance: <3 FB     Dental  (+) Upper Dentures   Pulmonary asthma , sleep apnea , former smoker,    Pulmonary exam normal        Cardiovascular + Peripheral Vascular Disease  Normal cardiovascular exam     Neuro/Psych  Headaches, PSYCHIATRIC DISORDERS Anxiety Depression Bipolar Disorder    GI/Hepatic Neg liver ROS, GERD  ,  Endo/Other    Renal/GU negative Renal ROS     Musculoskeletal  (+) Arthritis ,   Abdominal Normal abdominal exam  (+)   Peds negative pediatric ROS (+)  Hematology negative hematology ROS (+)   Anesthesia Other Findings Past Medical History: No date: ADHD (attention deficit hyperactivity disorder) No date: Anxiety No date: Arthritis No date: Asthma No date: Bipolar disorder (HCC) No date: Depression No date: GERD (gastroesophageal reflux disease) No date: Headache     Comment:  migraines No date: Sleep apnea     Comment:  cpap  Reproductive/Obstetrics                             Anesthesia Physical Anesthesia Plan  ASA: III  Anesthesia Plan: General   Post-op Pain Management:    Induction: Intravenous  PONV Risk Score and Plan:   Airway Management Planned: Oral ETT  Additional Equipment:   Intra-op Plan:   Post-operative Plan: Extubation in OR  Informed Consent: I have reviewed the patients History and Physical, chart, labs and discussed the procedure including the risks, benefits and alternatives for the proposed anesthesia with the patient or authorized representative who has indicated his/her understanding and acceptance.   Dental advisory given  Plan Discussed with: CRNA and Surgeon  Anesthesia Plan Comments:          Anesthesia Quick Evaluation

## 2017-06-12 NOTE — H&P (Signed)
HISTORY AND PHYSICAL INTERVAL NOTE:  06/12/2017  10:03 AM  Ann Morales  has presented today for surgery, with the diagnosis of Plantar Fasciitis-Left Peroneal tendon tear-Left.  The various methods of treatment have been discussed with the patient.  No guarantees were given.  After consideration of risks, benefits and other options for treatment, the patient has consented to surgery.  I have reviewed the patients' chart and labs.    Patient Vitals for the past 24 hrs:  BP Temp Temp src Pulse Resp SpO2 Height Weight  06/12/17 0930 (!) 128/113 97.9 F (36.6 C) Oral 83 18 99 % 5\' 4"  (1.626 m) 95.3 kg (210 lb)    A history and physical examination was performed in my office.  The patient was reexamined.  There have been no changes to this history and physical examination.  Samara Deist A

## 2017-06-12 NOTE — Anesthesia Post-op Follow-up Note (Signed)
Anesthesia QCDR form completed.        

## 2017-06-12 NOTE — Anesthesia Procedure Notes (Signed)
Procedure Name: Intubation Date/Time: 06/12/2017 10:55 AM Performed by: Marsh Dolly, CRNA Pre-anesthesia Checklist: Patient identified, Patient being monitored, Timeout performed, Emergency Drugs available and Suction available Patient Re-evaluated:Patient Re-evaluated prior to induction Oxygen Delivery Method: Circle system utilized Preoxygenation: Pre-oxygenation with 100% oxygen Induction Type: IV induction, Rapid sequence and Cricoid Pressure applied Ventilation: Mask ventilation without difficulty Laryngoscope Size: 3 and Miller Grade View: Grade I Tube type: Oral Tube size: 7.5 mm Number of attempts: 1 Placement Confirmation: ETT inserted through vocal cords under direct vision,  positive ETCO2 and breath sounds checked- equal and bilateral Secured at: 21 cm Tube secured with: Tape Dental Injury: Teeth and Oropharynx as per pre-operative assessment

## 2017-06-15 LAB — SURGICAL PATHOLOGY

## 2017-06-25 ENCOUNTER — Ambulatory Visit (INDEPENDENT_AMBULATORY_CARE_PROVIDER_SITE_OTHER): Payer: Medicaid Other | Admitting: Family Medicine

## 2017-06-25 ENCOUNTER — Encounter: Payer: Self-pay | Admitting: Family Medicine

## 2017-06-25 VITALS — BP 102/80 | HR 56 | Temp 98.6°F | Resp 16

## 2017-06-25 DIAGNOSIS — J0300 Acute streptococcal tonsillitis, unspecified: Secondary | ICD-10-CM | POA: Diagnosis not present

## 2017-06-25 DIAGNOSIS — J029 Acute pharyngitis, unspecified: Secondary | ICD-10-CM | POA: Diagnosis not present

## 2017-06-25 LAB — POCT RAPID STREP A (OFFICE): Rapid Strep A Screen: POSITIVE — AB

## 2017-06-25 MED ORDER — AMOXICILLIN 875 MG PO TABS: 875.0000 mg | ORAL_TABLET | Freq: Two times a day (BID) | ORAL | 0 refills | Status: DC

## 2017-06-25 NOTE — Progress Notes (Signed)
Subjective:     Patient ID: Ann Morales, female   DOB: Aug 09, 1975, 42 y.o.   MRN: [id number] Chief Complaint  Patient presents with  . Sore Throat    Patient comes in office today with complaints of sore throat for the past 24hrs. Patient reports symptoms of body aches, pain with swallowing and feeling feverish.    HPI States she has had exposure from her grandchildren but neither have had strep. Accompanied by her s.o.today. Reports she is otherwise doing well after recent foot surgery.  Review of Systems     Objective:   Physical Exam  Constitutional: She appears well-developed and well-nourished. No distress.  Ears: T.M's intact without inflammation Throat: mild tonsillar enlargement and erythema Neck: anterior cervical tenderness R> L Lungs: clear     Assessment:    1. Sore throat - POCT rapid strep A  2. Strep tonsillitis - amoxicillin (AMOXIL) 875 MG tablet; Take 1 tablet (875 mg total) by mouth 2 (two) times daily.  Dispense: 20 tablet; Refill: 0    Plan:    Continue ibuprofen and salt water gargles.

## 2017-06-25 NOTE — Patient Instructions (Signed)
Continue ibuprofen and salt water gargles. Let me know if new symptoms.

## 2017-08-11 ENCOUNTER — Other Ambulatory Visit: Payer: Self-pay

## 2017-08-11 ENCOUNTER — Encounter: Payer: Self-pay | Admitting: Emergency Medicine

## 2017-08-11 ENCOUNTER — Ambulatory Visit
Admission: EM | Admit: 2017-08-11 | Discharge: 2017-08-11 | Disposition: A | Discharge status: Home / Self Care | Payer: Medicaid Other | Attending: Family Medicine | Admitting: Family Medicine

## 2017-08-11 DIAGNOSIS — Z79899 Other long term (current) drug therapy: Secondary | ICD-10-CM | POA: Insufficient documentation

## 2017-08-11 DIAGNOSIS — J019 Acute sinusitis, unspecified: Secondary | ICD-10-CM

## 2017-08-11 DIAGNOSIS — Z9049 Acquired absence of other specified parts of digestive tract: Secondary | ICD-10-CM | POA: Insufficient documentation

## 2017-08-11 DIAGNOSIS — Z818 Family history of other mental and behavioral disorders: Secondary | ICD-10-CM | POA: Insufficient documentation

## 2017-08-11 DIAGNOSIS — J029 Acute pharyngitis, unspecified: Secondary | ICD-10-CM | POA: Insufficient documentation

## 2017-08-11 DIAGNOSIS — J45909 Unspecified asthma, uncomplicated: Secondary | ICD-10-CM | POA: Diagnosis not present

## 2017-08-11 DIAGNOSIS — I872 Venous insufficiency (chronic) (peripheral): Secondary | ICD-10-CM | POA: Insufficient documentation

## 2017-08-11 DIAGNOSIS — Z881 Allergy status to other antibiotic agents status: Secondary | ICD-10-CM | POA: Diagnosis not present

## 2017-08-11 DIAGNOSIS — Z8249 Family history of ischemic heart disease and other diseases of the circulatory system: Secondary | ICD-10-CM | POA: Diagnosis not present

## 2017-08-11 DIAGNOSIS — Z791 Long term (current) use of non-steroidal anti-inflammatories (NSAID): Secondary | ICD-10-CM | POA: Insufficient documentation

## 2017-08-11 DIAGNOSIS — Z885 Allergy status to narcotic agent status: Secondary | ICD-10-CM | POA: Diagnosis not present

## 2017-08-11 DIAGNOSIS — F3130 Bipolar disorder, current episode depressed, mild or moderate severity, unspecified: Secondary | ICD-10-CM | POA: Diagnosis not present

## 2017-08-11 DIAGNOSIS — F419 Anxiety disorder, unspecified: Secondary | ICD-10-CM | POA: Diagnosis not present

## 2017-08-11 DIAGNOSIS — G4733 Obstructive sleep apnea (adult) (pediatric): Secondary | ICD-10-CM | POA: Diagnosis not present

## 2017-08-11 DIAGNOSIS — F909 Attention-deficit hyperactivity disorder, unspecified type: Secondary | ICD-10-CM | POA: Diagnosis not present

## 2017-08-11 DIAGNOSIS — Z882 Allergy status to sulfonamides status: Secondary | ICD-10-CM | POA: Diagnosis not present

## 2017-08-11 DIAGNOSIS — E785 Hyperlipidemia, unspecified: Secondary | ICD-10-CM | POA: Diagnosis not present

## 2017-08-11 DIAGNOSIS — Z9889 Other specified postprocedural states: Secondary | ICD-10-CM | POA: Diagnosis not present

## 2017-08-11 DIAGNOSIS — F17210 Nicotine dependence, cigarettes, uncomplicated: Secondary | ICD-10-CM | POA: Diagnosis not present

## 2017-08-11 DIAGNOSIS — Z87442 Personal history of urinary calculi: Secondary | ICD-10-CM | POA: Insufficient documentation

## 2017-08-11 LAB — RAPID STREP SCREEN (MED CTR MEBANE ONLY): Streptococcus, Group A Screen (Direct): NEGATIVE

## 2017-08-11 MED ORDER — AMOXICILLIN-POT CLAVULANATE 875-125 MG PO TABS: 1.0000 | ORAL_TABLET | Freq: Two times a day (BID) | ORAL | 0 refills | Status: DC

## 2017-08-11 NOTE — ED Triage Notes (Signed)
Patient c/o sore throat, nasal congestion x 2 weeks.

## 2017-08-11 NOTE — ED Provider Notes (Signed)
[facility]    CSN: [id number] Arrival date & time: 08/11/17  1608  History   Chief Complaint Chief Complaint  Patient presents with  . Sore Throat   HPI   42 year old female presents with sinus pain and congestion as well as a sore throat.  Patient reports a 2-week history of sore throat, sinus pain and congestion.  She reports associated sinus headache.  She also reports she has been having a dry cough.  Reports chills but no fever.  She has been taking Sudafed and Mucinex without improvement.  No known exacerbating factors.  No other associated symptoms.  No other complaints.  Past Medical History:  Diagnosis Date  . ADHD (attention deficit hyperactivity disorder)   . Anxiety   . Arthritis   . Asthma   . Bipolar disorder ([place])   . Depression   . GERD (gastroesophageal reflux disease)   . Headache    migraines  . Sleep apnea    cpap    Patient Active Problem List   Diagnosis Date Noted  . Pain in limb 07/03/2016  . Superficial thrombophlebitis 04/24/2016  . Lymphedema 04/24/2016  . Varicose veins of bilateral lower extremities with pain 04/08/2016  . Chronic venous insufficiency 04/08/2016  . OSA (obstructive sleep apnea) 04/04/2016  . Adult ADHD 01/18/2016  . Bipolar I disorder, current or most recent episode depressed, with psychotic features ([place]) 01/18/2016  . Other specified anxiety disorders 01/18/2016  . Allergic rhinitis 01/15/2015  . Current smoker 01/15/2015  . H/O manic depressive disorder 01/15/2015  . H/O renal calculi 01/15/2015  . Asthma 01/15/2015  . HLD (hyperlipidemia) 10/25/2013    Past Surgical History:  Procedure Laterality Date  . BACK SURGERY  03/2017   cervical fusion  . CHOLECYSTECTOMY    . GALLBLADDER SURGERY    . PLANTAR FASCIA RELEASE Left 06/12/2017   Procedure: ENDOSCOPIC PLANTAR FASCIOTOMY-RELEASE;  Surgeon: Samara Deist, DPM;  Location: [facility];  Service: Podiatry;  Laterality: Left;  . REPAIR EXTENSOR TENDON  Left 06/12/2017   Procedure: REPAIR FLEXOR TENDON;  Surgeon: Samara Deist, DPM;  Location: [facility];  Service: Podiatry;  Laterality: Left;  . TARSAL TUNNEL RELEASE Left 06/12/2017   Procedure: TARSAL TUNNEL RELEASE-BAXTER RELEASE;  Surgeon: Samara Deist, DPM;  Location: [facility];  Service: Podiatry;  Laterality: Left;  . TUBAL LIGATION      OB History   None      Home Medications    Prior to Admission medications   Medication Sig Start Date End Date Taking? Authorizing Provider  albuterol (PROVENTIL HFA;VENTOLIN HFA) 108 (90 Base) MCG/ACT inhaler Inhale 2 puffs into the lungs every 6 (six) hours as needed for wheezing or shortness of breath. 09/04/16  Yes Carmon Ginsberg, PA  amphetamine-dextroamphetamine (ADDERALL) 10 MG tablet Take 10 mg by mouth daily at 2 PM. 05/04/17  Yes [provider]  buPROPion (WELLBUTRIN XL) 300 MG 24 hr tablet Take 300 mg by mouth daily.  04/16/16  Yes [provider]  clonazePAM (KLONOPIN) 0.5 MG tablet Take 1 tablet (0.5 mg total) by mouth 2 (two) times daily as needed for anxiety. Patient taking differently: Take 0.5 mg by mouth 2 (two) times daily.  04/16/15  Yes Marjie Skiff, MD  ibuprofen (ADVIL,MOTRIN) 200 MG tablet Take 800 mg by mouth every 8 (eight) hours as needed (for pain.).   Yes [provider]  LATUDA 40 MG TABS tablet Take 40 mg by mouth daily after supper.  02/06/17  Yes [provider]  lisdexamfetamine (VYVANSE) 40 MG capsule Take 40 mg by mouth daily.    Yes [provider]  lithium 300 MG tablet Take 300 mg by mouth 2 (two) times daily. 05/04/17  Yes [provider]  loratadine (CLARITIN) 10 MG tablet TAKE 1 TABLET BY MOUTH EVERY DAY 09/29/16  Yes Carmon Ginsberg, PA  omeprazole (PRILOSEC) 20 MG capsule Take 1 capsule (20 mg total) by mouth daily. 02/20/17  Yes Carmon Ginsberg, PA  SUMAtriptan (IMITREX) 50 MG tablet Take one tablet at onset of headache; may repeat in 2 hours if not  better Patient taking differently: Take 50 mg by mouth every 2 (two) hours as needed for migraine. Take one tablet at onset of headache; may repeat in 2 hours if not better 03/06/17  Yes Carmon Ginsberg, PA  traZODone (DESYREL) 150 MG tablet Take 150 mg by mouth at bedtime.  04/16/16  Yes [provider]  TRINTELLIX 10 MG TABS Take 10 mg by mouth daily.  08/25/16  Yes [provider]  amoxicillin-clavulanate (AUGMENTIN) 875-125 MG tablet Take 1 tablet by mouth every 12 (twelve) hours. 08/11/17   Coral Spikes, DO  ibuprofen (ADVIL,MOTRIN) 800 MG tablet TAKE 1 TABLET (800 MG TOTAL) BY MOUTH EVERY 6 (SIX) HOURS AS NEEDED FOR PAIN 06/12/17   [provider]    Family History Family History  Problem Relation Age of Onset  . Hypercholesterolemia Father   . Bipolar disorder Mother   . Hypercholesterolemia Maternal Grandmother   . Hypertension Maternal Grandmother   . Anxiety disorder Maternal Grandmother   . Depression Maternal Grandmother     Social History Social History   Tobacco Use  . Smoking status: Current Every Day Smoker    Packs/day: 2.00    Years: 15.00    Pack years: 30.00    Types: Cigarettes    Start date: 09/25/1994  . Smokeless tobacco: Never Used  Substance Use Topics  . Alcohol use: No    Alcohol/week: 0.0 oz  . Drug use: No     Allergies   Codeine; Azithromycin; and Sulfa antibiotics   Review of Systems Review of Systems  Constitutional: Positive for chills. Negative for fever.  HENT: Positive for congestion, sinus pressure, sinus pain and sore throat.   Respiratory: Positive for cough.    Physical Exam Triage Vital Signs ED Triage Vitals  Enc Vitals Group     BP 08/11/17 1625 95/68     Pulse Rate 08/11/17 1625 60     Resp 08/11/17 1625 18     Temp 08/11/17 1625 98.7 F (37.1 C)     Temp Source 08/11/17 1625 Oral     SpO2 08/11/17 1625 98 %     Weight 08/11/17 1621 200 lb (90.7 kg)     Height 08/11/17 1621 5\' 7"  (1.702 m)      Head Circumference --      Peak Flow --      Pain Score 08/11/17 1621 10     Pain Loc --      Pain Edu? --      Excl. in [place]? --    Updated Vital Signs BP 95/68 (BP Location: Left Arm)   Pulse 60   Temp 98.7 F (37.1 C) (Oral)   Resp 18   Ht 5\' 7"  (1.702 m)   Wt 200 lb (90.7 kg)   LMP 08/07/2017 (Exact Date)   SpO2 98%   BMI 31.32 kg/m   Physical Exam  Constitutional: She is  oriented to person, place, and time. She appears well-developed. No distress.  HENT:  Head: Normocephalic and atraumatic.  Oropharynx mild erythema.  Cobblestoning noted.  Severe maxillary sinus tenderness to palpation.  Cardiovascular: Normal rate and regular rhythm.  Pulmonary/Chest: Effort normal and breath sounds normal. She has no wheezes. She has no rales.  Neurological: She is alert and oriented to person, place, and time.  Psychiatric: She has a normal mood and affect. Her behavior is normal.  Nursing note and vitals reviewed.  UC Treatments / Results  Labs (all labs ordered are listed, but only abnormal results are displayed) Labs Reviewed  RAPID STREP SCREEN ([facility] ONLY)  CULTURE, GROUP A STREP [facility])    EKG None  Radiology No results found.  Procedures Procedures (including critical care time)  Medications Ordered in UC Medications - No data to display  Initial Impression / Assessment and Plan / UC Course  I have reviewed the triage vital signs and the nursing notes.  Pertinent labs & imaging results that were available during my care of the patient were reviewed by me and considered in my medical decision making (see chart for details).    42 year old female presents with sinusitis.  Treating with Augmentin.  Final Clinical Impressions(s) / UC Diagnoses   Final diagnoses:  Acute sinusitis, recurrence not specified, unspecified location   Discharge Instructions   None    ED Prescriptions    Medication Sig Dispense Auth. Provider   amoxicillin-clavulanate  (AUGMENTIN) 875-125 MG tablet Take 1 tablet by mouth every 12 (twelve) hours. 14 tablet Coral Spikes, DO     Controlled Substance Prescriptions [place] Controlled Substance Registry consulted? Not Applicable   Coral Spikes, DO 08/11/17 1700

## 2017-08-15 LAB — CULTURE, GROUP A STREP (THRC)

## 2017-08-19 ENCOUNTER — Ambulatory Visit: Payer: Medicaid Other | Attending: Podiatry | Admitting: Physical Therapy

## 2017-08-19 ENCOUNTER — Encounter: Payer: Self-pay | Admitting: Physical Therapy

## 2017-08-19 DIAGNOSIS — R262 Difficulty in walking, not elsewhere classified: Secondary | ICD-10-CM

## 2017-08-19 DIAGNOSIS — M6281 Muscle weakness (generalized): Secondary | ICD-10-CM | POA: Insufficient documentation

## 2017-08-19 DIAGNOSIS — M79672 Pain in left foot: Secondary | ICD-10-CM | POA: Diagnosis present

## 2017-08-19 NOTE — Therapy (Signed)
[facility] [address]. [place], [place], [address] Phone: [phone]   Fax:  [phone]  Physical Therapy Evaluation  Patient Details  Name: Emri Sample MRN: [id number] Date of Birth: 1975-06-01 Referring Provider: Dr. Vickki Muff   Encounter Date: 08/19/2017    PT End of Session - 08/19/17 1550    Visit Number  1    Number of Visits  4    Date for PT Re-Evaluation  09/16/17    PT Start Time  1427    PT Stop Time  1539    PT Time Calculation (min)  72 min    Activity Tolerance  Patient tolerated treatment well;Patient limited by pain    Behavior During Therapy  [facility] for tasks assessed/performed         Past Medical History:  Diagnosis Date  . ADHD (attention deficit hyperactivity disorder)   . Anxiety   . Arthritis   . Asthma   . Bipolar disorder ([place])   . Depression   . GERD (gastroesophageal reflux disease)   . Headache    migraines  . Sleep apnea    cpap    Past Surgical History:  Procedure Laterality Date  . BACK SURGERY  03/2017   cervical fusion  . CHOLECYSTECTOMY    . GALLBLADDER SURGERY    . PLANTAR FASCIA RELEASE Left 06/12/2017   Procedure: ENDOSCOPIC PLANTAR FASCIOTOMY-RELEASE;  Surgeon: Samara Deist, DPM;  Location: [facility];  Service: Podiatry;  Laterality: Left;  . REPAIR EXTENSOR TENDON Left 06/12/2017   Procedure: REPAIR FLEXOR TENDON;  Surgeon: Samara Deist, DPM;  Location: [facility];  Service: Podiatry;  Laterality: Left;  . TARSAL TUNNEL RELEASE Left 06/12/2017   Procedure: TARSAL TUNNEL RELEASE-BAXTER RELEASE;  Surgeon: Samara Deist, DPM;  Location: [facility];  Service: Podiatry;  Laterality: Left;  . TUBAL LIGATION      There were no vitals filed for this visit.    Pt. is a pleasant 42 y.o. female that comes to therapy with pain in L foot s/p surgery in April. She is currently wearing soft ankle brace on L LE. Prior to surgery, she reports that the pain was 10/10 and was produced  with being on feet for any prolonged period of time. At the moment she presents with 5/10 pain in the left foot in same location. Pt. has not tried inserts or tennis shoes since surgery, preferring flip-flops to this point.        Evaluation:   Posture: pt stands with flexed-forward posture.  Gait analysis: antalgic gait on L side, decreased stance time on L, decreased step length of R, decreased arm swing and trunk rotation through all phases of gait.    Gross Balance Sitting: Good Standing: Good  ROM grossly normal through Hip, Knee Ankle R L Dorsi 94 90 Plant 150 140 Invers 32 34 Evers 10 6  MMT: Hip       R  L Flex     3+(pain) 3+ IR 4-(pain) 5 ER 5  5  Knee    R  L Flex     5(  5 Ext       5(pain)       5  Ankle  R  L Dorsiflexion 5  4 Plantarflexion 5  4 Eversion 5  nt due to pain Inversion 5  4   Fig. 8 Girth R  L   53.5cm 55cm     Neuro: Sensation normal in BLE Coordination grossly  normal   Patient reports anterior hip pain with reaching onto floor to pick up an object; suspect psoas involvement.  Outcomes: FOTO: 40/54    See HEP   PT Long Term Goals - 08/19/17 1459      PT LONG TERM GOAL #1   Title  Pt. will ambulate 500 ft. with 2/10 L foot pain in order to do community activities such as grocery shopping.    Baseline  200 ft before pain    Time  4    Period  Weeks    Status  New    Target Date  09/16/17      PT LONG TERM GOAL #2   Title  Pt will be independent with HEP and report no pain in L foot on pain scale at rest.    Baseline  5/10 at rest    Time  4    Period  Weeks    Status  New    Target Date  09/16/17      PT LONG TERM GOAL #3   Title  Pt will increase FOTO score to 64 to demonstrate reduced disability.    Baseline  40 on FOTO on 6/12    Time  4    Period  Weeks    Status  New    Target Date  09/16/17         Pt. is 42 y.o. female that presents to therapy s/p surgical intervention for plantar fascia  release with complaints of continued heel pain. Pt. demonstrates increase pain in L foot, low back pain in R lumbar region, slightly decreased ROM of L ankle compared to R, and decreased strength in all planes of movement of L ankle. Pt will benefit from skilled therapy to increase strength in all ROM of L ankle and to decrease pain in movements with activity.        Patient will benefit from skilled therapeutic intervention in order to improve the following deficits and impairments:  Abnormal gait, Decreased activity tolerance, Decreased endurance, Decreased range of motion, Decreased strength, Increased fascial restricitons, Increased edema, Difficulty walking, Decreased mobility, Pain  Visit Diagnosis: Pain in left foot  Muscle weakness (generalized)  Difficulty in walking, not elsewhere classified     Problem List Patient Active Problem List   Diagnosis Date Noted  . Pain in limb 07/03/2016  . Superficial thrombophlebitis 04/24/2016  . Lymphedema 04/24/2016  . Varicose veins of bilateral lower extremities with pain 04/08/2016  . Chronic venous insufficiency 04/08/2016  . OSA (obstructive sleep apnea) 04/04/2016  . Adult ADHD 01/18/2016  . Bipolar I disorder, current or most recent episode depressed, with psychotic features ([place]) 01/18/2016  . Other specified anxiety disorders 01/18/2016  . Allergic rhinitis 01/15/2015  . Current smoker 01/15/2015  . H/O manic depressive disorder 01/15/2015  . H/O renal calculi 01/15/2015  . Asthma 01/15/2015  . HLD (hyperlipidemia) 10/25/2013   Pura Spice, PT, DPT # [phone] [place], SPT 08/21/2017, 6:51 AM  [facility] [address] [place], [place], [address] Phone: [phone]   Fax:  [phone]  Name: Irean Kendricks MRN: [id number] Date of Birth: 1976/03/04

## 2017-08-19 NOTE — Patient Instructions (Signed)
Exercises  Ankle Eversion with Resistance - 10 reps - 3 sets - 1x daily - 7x weekly  Ankle Inversion with Resistance - 10 reps - 3 sets - 1x daily - 7x weekly  Ankle and Toe Plantarflexion with Resistance - 10 reps - 3 sets - 1x daily - 7x weekly  Ankle Dorsiflexion with Resistance - 10 reps - 3 sets - 1x daily - 7x weekly  Foot Roller Plantar Massage - 10 reps - 3 sets - 1x daily - 7x weekly  Seated Plantar Fascia Stretch - 10 reps - 3 sets - 1x daily - 7x weekly

## 2017-08-26 ENCOUNTER — Ambulatory Visit: Payer: Medicaid Other | Admitting: Physical Therapy

## 2017-09-02 ENCOUNTER — Encounter: Payer: Self-pay | Admitting: Physical Therapy

## 2017-09-02 ENCOUNTER — Ambulatory Visit: Payer: Medicaid Other | Admitting: Physical Therapy

## 2017-09-02 DIAGNOSIS — M6281 Muscle weakness (generalized): Secondary | ICD-10-CM

## 2017-09-02 DIAGNOSIS — R262 Difficulty in walking, not elsewhere classified: Secondary | ICD-10-CM

## 2017-09-02 DIAGNOSIS — M79672 Pain in left foot: Secondary | ICD-10-CM | POA: Diagnosis not present

## 2017-09-02 NOTE — Therapy (Addendum)
[facility] [address] [place], [place], [address] Phone: [phone]   Fax:  [phone]  Physical Therapy Treatment  Patient Details  Name: Ann Morales MRN: [id number] Date of Birth: 1975/06/15 Referring Provider: Dr. Vickki Muff   Encounter Date: 09/02/2017  PT End of Session - 09/02/17 1736    Visit Number  2    Number of Visits  4    Date for PT Re-Evaluation  09/16/17    PT Start Time  3419    PT Stop Time  1517    PT Time Calculation (min)  46 min    Activity Tolerance  Patient tolerated treatment well;Patient limited by pain    Behavior During Therapy  [facility] for tasks assessed/performed       Past Medical History:  Diagnosis Date  . ADHD (attention deficit hyperactivity disorder)   . Anxiety   . Arthritis   . Asthma   . Bipolar disorder ([place])   . Depression   . GERD (gastroesophageal reflux disease)   . Headache    migraines  . Sleep apnea    cpap    Past Surgical History:  Procedure Laterality Date  . BACK SURGERY  03/2017   cervical fusion  . CHOLECYSTECTOMY    . GALLBLADDER SURGERY    . PLANTAR FASCIA RELEASE Left 06/12/2017   Procedure: ENDOSCOPIC PLANTAR FASCIOTOMY-RELEASE;  Surgeon: Samara Deist, DPM;  Location: [facility];  Service: Podiatry;  Laterality: Left;  . REPAIR EXTENSOR TENDON Left 06/12/2017   Procedure: REPAIR FLEXOR TENDON;  Surgeon: Samara Deist, DPM;  Location: [facility];  Service: Podiatry;  Laterality: Left;  . TARSAL TUNNEL RELEASE Left 06/12/2017   Procedure: TARSAL TUNNEL RELEASE-BAXTER RELEASE;  Surgeon: Samara Deist, DPM;  Location: [facility];  Service: Podiatry;  Laterality: Left;  . TUBAL LIGATION      There were no vitals filed for this visit.  Subjective Assessment - 09/02/17 1732    Subjective  Pt reports that she forgot about last session and is apologetic. Reports to PT without ankle brace saying that she is wearing it for the latter half of day with onset of fatigue.  Pain is reduced following HEP adherence. Pain worst in morning 8/10 with first steps out of bed; improved but not eliminated with stretching.    Pertinent History  Patient has been to see Physical Therapy for neck, but reports that she did not see much results from it.    Limitations  Standing;Walking    How long can you walk comfortably?  200 ft.    Patient Stated Goals  Decrease pain and be able to walk dog.    Currently in Pain?  Yes    Pain Score  4     Pain Location  Foot    Pain Orientation  Left    Pain Descriptors / Indicators  Burning;Aching;Numbness    Pain Type  Chronic pain;Surgical pain    Pain Onset  More than a month ago    Pain Frequency  Constant    Aggravating Factors   Prolonged standing    Pain Relieving Factors  Walking    Multiple Pain Sites  Yes    Pain Score  6    Pain Location  Back    Pain Orientation  Right    Pain Descriptors / Indicators  Aching    Pain Type  Chronic pain    Pain Radiating Towards  R buttock    Pain Onset  More  than a month ago    Pain Frequency  Intermittent    Aggravating Factors   Resisted hip movements, transfers    Pain Relieving Factors  Walking          TREATMENT  Myofascial release to plantar surface of L foot x 6 min - improved tissue extensibility noted with mild pain and disorganization on deep palpation. Calcaneal mobilization into inversion grade II-III 3 x 30 sec bouts Calcaneal inversion for plantar fascia stretch 3 x 30 sec Myofascial release, trigger point release, and cross friction massage to peroneals to reduce focal tightness/pain and improve tissue alignment x 4 min.  Therapeutc exercise: Plantarflexion, inversion, eversion with yellow theraband resistance x 12 each. Cues for alignment. Pt demonstrates reduced strength in everters but excellent ability to correct alignment with cues. Eversion isometrics at 75% intensity 3 x 10 sec hold with cues to avoid pain   Pt reports LBP 6/10 for 4-5 years, worse with  transfers, improved with walking.  Lumbar flexion, L side-bending, and R rot limited (25%) and painful. Other lumbar motions WNL. Special tests negative for neurodynamic involvement. SLR positive, painful with resistance but no N/T. L HS length WNL but R cannot be fully flexed due to pain, "tightness" in back. Reduced lumbar lordosis noted.  Pt exquisitely tender to palpation over R paraspinals, quadratus lumborum; improved with gentle myofascial release x 5 min.  Lower trunk rotation x 20 with direction to increase rotation to point of "stretch". Transversus abdominis activation exercise in hooklying 20 x 3 sec hold - pt demonstrates posterior pelvic tilting and hip extension > true TvA activation but able to improve with mod cues. Pelvic rocks on ball x 20 anterior/posterior - therapist cues for core activation  Pt reports low back pain eliminated with transfers and walking following therapeutic exercise and therapist direction to activate core musculature.  HEP: added lower trunk rotation.     PT Education - 09/02/17 1525    Education Details  See HEP (trunk rotation). Plantar fasciitis etiology and prognosis.    Person(s) Educated  Patient    Methods  Explanation;Handout    Comprehension  Verbalized understanding;Returned demonstration          PT Long Term Goals - 09/02/17 1743      PT LONG TERM GOAL #1   Title  Pt. will ambulate 500 ft. with 2/10 L foot pain in order to do community activities such as grocery shopping.    Baseline  200 ft before pain    Time  4    Period  Weeks    Status  New    Target Date  09/16/17      PT LONG TERM GOAL #2   Title  Pt will be independent with HEP and report no pain in L foot on pain scale at rest.    Baseline  5/10 at rest    Time  4    Period  Weeks    Status  New    Target Date  09/16/17      PT LONG TERM GOAL #3   Title  Pt will increase FOTO score to 64 to demonstrate reduced disability.    Baseline  40 on FOTO on 6/12     Time  4    Period  Weeks    Status  New      PT LONG TERM GOAL #4   Title  Pt will reduce LBP to 3/10 worst when entering/exiting vehicle to demonstrate improved functional  capacity with IADLs.    Baseline  6/10 pain with transfers    Time  2    Period  Weeks    Status  New    Target Date  09/16/17      PT LONG TERM GOAL #5   Title  Pt will report ability to walk dog for 15 min without worsening of symptoms to facilitate salient long-term home exercise program and improved functional capacity.    Baseline  Unable to walk dog without increase in pain.    Time  2    Period  Weeks    Status  New    Target Date  09/16/17            Plan - 09/02/17 1736    Clinical Impression Statement  Pt demonstrates improved tissue mobility on palpation of plantar surface of foot; tolerates calcaneal mobilization/stretch and ankle strengthening well. Pt demonstrates pain and restrictions in lumbar flexion, L side-bending and R rotation; special tests for neurological involvement negative but resisted flexion is provoking in all positions on R. Pt is exquisitely tender to palpation over R paraspinals and QL. Pain reduced with lower trunk rotation, abdominal bracing, pelvic tilt exercise. Impression: mechanical LBP secondary to aberrant stabilization of lumbar musculature, abdominal weakness, and gait abnormalities due to L heel pain. Patient will benefit from skilled physical therapy to increase strength L ankle and core musculature, reduce pain in L heel, and return to PLOF.    Rehab Potential  Fair    PT Frequency  1x / week    PT Duration  4 weeks    PT Treatment/Interventions  ADLs/Self Care Home Management;Cryotherapy;Electrical Stimulation;Iontophoresis 4mg /ml Dexamethasone;Moist Heat;Gait training;Stair training;Functional mobility training;Therapeutic activities;Therapeutic exercise;Orthotic Fit/Training;Patient/family education;Neuromuscular re-education;Manual techniques;Scar  mobilization;Taping;Dry needling;Passive range of motion    PT Next Visit Plan  Progress HEP and balance therex; calf stretch    PT Home Exercise Plan  see handouts    Consulted and Agree with Plan of Care  Patient       Patient will benefit from skilled therapeutic intervention in order to improve the following deficits and impairments:  Abnormal gait, Decreased activity tolerance, Decreased endurance, Decreased range of motion, Decreased strength, Increased fascial restricitons, Increased edema, Difficulty walking, Decreased mobility, Pain  Visit Diagnosis: Pain in left foot  Muscle weakness (generalized)  Difficulty in walking, not elsewhere classified     Problem List Patient Active Problem List   Diagnosis Date Noted  . Pain in limb 07/03/2016  . Superficial thrombophlebitis 04/24/2016  . Lymphedema 04/24/2016  . Varicose veins of bilateral lower extremities with pain 04/08/2016  . Chronic venous insufficiency 04/08/2016  . OSA (obstructive sleep apnea) 04/04/2016  . Adult ADHD 01/18/2016  . Bipolar I disorder, current or most recent episode depressed, with psychotic features ([place]) 01/18/2016  . Other specified anxiety disorders 01/18/2016  . Allergic rhinitis 01/15/2015  . Current smoker 01/15/2015  . H/O manic depressive disorder 01/15/2015  . H/O renal calculi 01/15/2015  . Asthma 01/15/2015  . HLD (hyperlipidemia) 10/25/2013    Pura Spice, PT, DPT # [id number], SPT 09/02/2017, 6:01 PM  [place] [facility] [address] [place], [place], [address] Phone: [phone]   Fax:  [phone]  Name: Ann Morales MRN: [id number] Date of Birth: Jul 13, 1975

## 2017-09-02 NOTE — Patient Instructions (Signed)
Access Code: [email or web address]  URL: [email or web address]  Date: 09/02/2017  Prepared by: Dorcas Carrow   Exercises  Supine Lower Trunk Rotation - 10 reps - 1 sets - 10 hold - 1x daily - 7x weekly

## 2017-09-09 ENCOUNTER — Encounter: Payer: Medicaid Other | Admitting: Physical Therapy

## 2017-09-15 ENCOUNTER — Encounter: Payer: Self-pay | Admitting: Physical Therapy

## 2017-09-15 ENCOUNTER — Ambulatory Visit: Payer: Medicaid Other | Attending: Podiatry | Admitting: Physical Therapy

## 2017-09-15 DIAGNOSIS — M6281 Muscle weakness (generalized): Secondary | ICD-10-CM | POA: Diagnosis present

## 2017-09-15 DIAGNOSIS — R262 Difficulty in walking, not elsewhere classified: Secondary | ICD-10-CM | POA: Insufficient documentation

## 2017-09-15 DIAGNOSIS — M79672 Pain in left foot: Secondary | ICD-10-CM | POA: Insufficient documentation
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
## 2017-09-23 ENCOUNTER — Ambulatory Visit: Payer: Medicaid Other | Admitting: Physical Therapy

## 2017-09-30 ENCOUNTER — Encounter: Payer: Self-pay | Admitting: Physical Therapy

## 2017-09-30 ENCOUNTER — Ambulatory Visit: Payer: Medicaid Other

## 2017-09-30 DIAGNOSIS — M79672 Pain in left foot: Secondary | ICD-10-CM | POA: Diagnosis not present

## 2017-09-30 DIAGNOSIS — R262 Difficulty in walking, not elsewhere classified: Secondary | ICD-10-CM

## 2017-09-30 DIAGNOSIS — M6281 Muscle weakness (generalized): Secondary | ICD-10-CM

## 2017-09-30 NOTE — Therapy (Cosign Needed)
[facility] [address]. [place], [place], [address] Phone: [phone]   Fax:  [phone]  Physical Therapy Treatment/Discharge  Dates of reporting period  08/19/17   to   09/30/17  Patient Details  Name: Ann Morales MRN: [id number] Date of Birth: 10-26-1975 Referring Provider: Dr. Vickki Muff   Encounter Date: 09/30/2017  PT End of Session - 09/30/17 0949    Visit Number  4    Number of Visits  6    Date for PT Re-Evaluation  10/13/17    PT Start Time  0949    PT Stop Time  1029    PT Time Calculation (min)  40 min    Activity Tolerance  Patient tolerated treatment well;Patient limited by pain    Behavior During Therapy  [facility] for tasks assessed/performed       Past Medical History:  Diagnosis Date  . ADHD (attention deficit hyperactivity disorder)   . Anxiety   . Arthritis   . Asthma   . Bipolar disorder ([place])   . Depression   . GERD (gastroesophageal reflux disease)   . Headache    migraines  . Sleep apnea    cpap    Past Surgical History:  Procedure Laterality Date  . BACK SURGERY  03/2017   cervical fusion  . CHOLECYSTECTOMY    . GALLBLADDER SURGERY    . PLANTAR FASCIA RELEASE Left 06/12/2017   Procedure: ENDOSCOPIC PLANTAR FASCIOTOMY-RELEASE;  Surgeon: Samara Deist, DPM;  Location: [facility];  Service: Podiatry;  Laterality: Left;  . REPAIR EXTENSOR TENDON Left 06/12/2017   Procedure: REPAIR FLEXOR TENDON;  Surgeon: Samara Deist, DPM;  Location: [facility];  Service: Podiatry;  Laterality: Left;  . TARSAL TUNNEL RELEASE Left 06/12/2017   Procedure: TARSAL TUNNEL RELEASE-BAXTER RELEASE;  Surgeon: Samara Deist, DPM;  Location: [facility] OR[facility];  Service: Podiatry;  Laterality: Left;  . TUBAL LIGATION      There were no vitals filed for this visit.  Subjective Assessment - 09/30/17 0953    Subjective  Pt. reports she is feeling better in her foot and has had stiffness in talocrual joint.  Pt. has noticed a difference  in wearing sneakers and has been wearing them more often.  Pt. currently wearing flip flops due to being in a rush this morning.    Pertinent History  Patient has been to see Physical Therapy for neck, but reports that she did not see much results from it.    Limitations  Standing;Walking    How long can you walk comfortably?  200 ft.    Patient Stated Goals  Decrease pain and be able to walk dog.    Currently in Pain?  Yes    Pain Score  1     Pain Location  Foot    Pain Orientation  Left    Pain Descriptors / Indicators  Aching    Pain Type  Chronic pain    Pain Onset  More than a month ago    Pain Frequency  Constant         TREATMENT  Manual STM plantar surface of L foot and Gastrocs 15 min Calcaneal mobilization into inversion grade II-III 4x30sec Calcaneal inversion for plantar fascia stretch4x30sec STM to tender areas near Medial Calcaneus 9 min  Ther-ex  Seated Ankle 4-Way with RTB 2x15. Standing wall stretch with toes extended 8x30sec  Pt. educated on importance of HEP.  Pt. Given GTB to increase resistance as pt. Is doing  well with RTB currently.    Pt. Filled out FOTO Ann Morales)     PT Education - 09/30/17 1042    Education Details  Pt. educated on discharge and importance to keep PT clinic updated on progress and if there is any regression or increase in painful sx's.    Person(s) Educated  Patient    Methods  Explanation;Handout    Comprehension  Verbalized understanding          PT Long Term Goals - 09/30/17 1043      PT LONG TERM GOAL #1   Title  Pt. will ambulate 500 ft. with 2/10 L foot pain in order to do community activities such as grocery shopping.    Baseline  500 ft without pain, however walking longer distances pain rises to 4/10.    Time  4    Period  Weeks    Status  Achieved    Target Date  10/13/17      PT LONG TERM GOAL #2   Title  Pt will be independent with HEP and report no pain in L foot on pain scale at rest.    Baseline   1/10 L foot    Time  4    Period  Weeks    Status  Partially Met    Target Date  10/13/17      PT LONG TERM GOAL #3   Title  Pt will increase FOTO score to 64 to demonstrate reduced disability.    Baseline  40 on FOTO on 6/12; FOTO: 50 on 09/30/17    Time  4    Period  Weeks    Status  Partially Met    Target Date  10/13/17      PT LONG TERM GOAL #4   Title  Pt will reduce LBP to 3/10 worst when entering/exiting vehicle to demonstrate improved functional capacity with IADLs.    Baseline  6/10 pain with transfers; 1/10 constant pain on 09/30/17    Time  2    Period  Weeks    Status  Achieved    Target Date  09/16/17      PT LONG TERM GOAL #5   Title  Pt will report ability to walk dog for 15 min without worsening of symptoms to facilitate salient long-term home exercise program and improved functional capacity.    Baseline  Has not attempted walking dog.    Time  4    Period  Weeks    Status  Deferred    Target Date  10/13/17            Plan - 09/30/17 0949    Clinical Impression Statement  Pt. has made significant progress with increasing ROM, strength, and decreasing of pain with walking.  Pt. also much more compliant with wearing sneakers and reports that they are much more supportive and help with foot stability. Pt. educated on possible orthotic use per podiatry recommendation to increase support for plantar fascia and arches of foot.  Pt. performing consistently well with HEP with use of therband for ankle stability and will be using GTB for progression.  Pt. encouraged to visit clinic if any regression occurs.  Pt. is being discharged at this time due to goals being met and increase in functional ability after therapy.    Clinical Presentation  Evolving    Clinical Decision Making  Moderate    Rehab Potential  Fair    PT Frequency  1x / week  PT Duration  4 weeks    PT Treatment/Interventions  ADLs/Self Care Home Management;Cryotherapy;Electrical  Stimulation;Iontophoresis 90m/ml Dexamethasone;Moist Heat;Gait training;Stair training;Functional mobility training;Therapeutic activities;Therapeutic exercise;Orthotic Fit/Training;Patient/family education;Neuromuscular re-education;Manual techniques;Scar mobilization;Taping;Dry needling;Passive range of motion    PT Next Visit Plan  D/C at this time.    PT Home Exercise Plan  see handouts    Consulted and Agree with Plan of Care  Patient       Patient will benefit from skilled therapeutic intervention in order to improve the following deficits and impairments:  Abnormal gait, Decreased activity tolerance, Decreased endurance, Decreased range of motion, Decreased strength, Increased fascial restricitons, Increased edema, Difficulty walking, Decreased mobility, Pain  Visit Diagnosis: Pain in left foot  Muscle weakness (generalized)  Difficulty in walking, not elsewhere classified     Problem List Patient Active Problem List   Diagnosis Date Noted  . Pain in limb 07/03/2016  . Superficial thrombophlebitis 04/24/2016  . Lymphedema 04/24/2016  . Varicose veins of bilateral lower extremities with pain 04/08/2016  . Chronic venous insufficiency 04/08/2016  . OSA (obstructive sleep apnea) 04/04/2016  . Adult ADHD 01/18/2016  . Bipolar I disorder, current or most recent episode depressed, with psychotic features ([place] 01/18/2016  . Other specified anxiety disorders 01/18/2016  . Allergic rhinitis 01/15/2015  . Current smoker 01/15/2015  . H/O manic depressive disorder 01/15/2015  . H/O renal calculi 01/15/2015  . Asthma 01/15/2015  . HLD (hyperlipidemia) 10/25/2013   This entire session was performed under direct supervision and direction of a licensed therapist/therapist assistant . I have personally read, edited and approve of the note as written.   JGwenlyn SaranSPT JPhillips GroutPT, DPT, GCS  Huprich,Jason 10/01/2017, 1:58 PM  [facility] [place] [address]Phone: [phone]  Fax:  [phone] Name: HLiliani BoboMRN: [id number] of Birth: [birth year]

## 2017-10-07 ENCOUNTER — Encounter: Payer: Medicaid Other | Admitting: Physical Therapy

## 2017-11-20 ENCOUNTER — Other Ambulatory Visit: Payer: Self-pay | Admitting: Family Medicine

## 2017-11-20 ENCOUNTER — Telehealth: Payer: Self-pay | Admitting: Family Medicine

## 2017-11-20 DIAGNOSIS — J4520 Mild intermittent asthma, uncomplicated: Secondary | ICD-10-CM

## 2017-11-20 MED ORDER — ALBUTEROL SULFATE HFA 108 (90 BASE) MCG/ACT IN AERS: 2.0000 | INHALATION_SPRAY | Freq: Four times a day (QID) | RESPIRATORY_TRACT | 5 refills | Status: DC | PRN

## 2017-11-20 MED ORDER — LORATADINE 10 MG PO TABS: ORAL_TABLET | ORAL | 5 refills | Status: DC

## 2017-11-20 NOTE — Telephone Encounter (Signed)
Please review. KW 

## 2017-11-20 NOTE — Telephone Encounter (Signed)
done

## 2017-11-20 NOTE — Telephone Encounter (Signed)
Pt needs a refill on  Albuterol inhaler  Claritin 10 mg  [facility] [place]  Pt # [phone]

## 2018-03-26 ENCOUNTER — Ambulatory Visit: Payer: Medicaid Other | Admitting: Family Medicine

## 2018-03-26 ENCOUNTER — Other Ambulatory Visit: Payer: Self-pay

## 2018-03-26 ENCOUNTER — Encounter: Payer: Self-pay | Admitting: Family Medicine

## 2018-03-26 VITALS — BP 112/76 | HR 77 | Temp 98.0°F | Ht 67.0 in | Wt 223.0 lb

## 2018-03-26 DIAGNOSIS — H1033 Unspecified acute conjunctivitis, bilateral: Secondary | ICD-10-CM | POA: Diagnosis not present

## 2018-03-26 DIAGNOSIS — Z8669 Personal history of other diseases of the nervous system and sense organs: Secondary | ICD-10-CM | POA: Diagnosis not present

## 2018-03-26 MED ORDER — SUMATRIPTAN SUCCINATE 50 MG PO TABS: ORAL_TABLET | ORAL | 2 refills | Status: DC

## 2018-03-26 MED ORDER — POLYMYXIN B-TRIMETHOPRIM 10000-0.1 UNIT/ML-% OP SOLN: OPHTHALMIC | 0 refills | Status: DC

## 2018-03-26 NOTE — Patient Instructions (Signed)
Discussed use of warm, wet compresses several x today. If not improving in the next day or two start the antibiotic eye drops.

## 2018-03-26 NOTE — Progress Notes (Signed)
  Subjective:     Patient ID: Ann Morales, female   DOB: 10-Jun-1975, 43 y.o.   MRN: [id number] Chief Complaint  Patient presents with  . eye itching    left started on 03/21/18 and now both eyes are itching   HPI Also reports eye pain but no exposure to f.b's or contact lenses. Does reports mild sinus congestion but does not feel ill. States eyes are mildly matted in the AM. Also wishes refill on migraine medication.  Review of Systems     Objective:   Physical Exam Constitutional:      General: She is not in acute distress.    Appearance: She is not ill-appearing.  Eyes:     General:        Right eye: No discharge.        Left eye: No discharge.     Conjunctiva/sclera: Conjunctivae normal.     Pupils: Pupils are equal, round, and reactive to light.  Neurological:     Mental Status: She is alert.        Assessment:    1. Acute conjunctivitis of both eyes, unspecified acute conjunctivitis type - trimethoprim-polymyxin b (POLYTRIM) ophthalmic solution; 1-2 drops every 4-6 hours (max 6 drops/day)  Dispense: 10 mL; Refill: 0  2. History of migraine headaches; refilled Imitrex    Plan:    Discussed frequent use of warm, wet compresses. To start abx if not improving in the next 1-2 days.

## 2018-09-01 ENCOUNTER — Other Ambulatory Visit: Payer: Self-pay

## 2018-09-01 MED ORDER — IBUPROFEN 800 MG PO TABS: ORAL_TABLET | ORAL | 3 refills | Status: DC

## 2018-09-01 NOTE — Telephone Encounter (Signed)
Patient is requesting a refill on [facility]ro[facility] She was Bob's patient.

## 2018-10-27 ENCOUNTER — Ambulatory Visit
Admission: EM | Admit: 2018-10-27 | Discharge: 2018-10-27 | Disposition: A | Discharge status: Other Acute Inpatient Hospital | Payer: Medicaid Other | Attending: Family Medicine | Admitting: Family Medicine

## 2018-10-27 ENCOUNTER — Other Ambulatory Visit: Payer: Self-pay

## 2018-10-27 DIAGNOSIS — R1031 Right lower quadrant pain: Secondary | ICD-10-CM

## 2018-10-27 LAB — URINALYSIS, COMPLETE (UACMP) WITH MICROSCOPIC
Bacteria, UA: NONE SEEN
Bilirubin Urine: NEGATIVE
Glucose, UA: NEGATIVE mg/dL
Ketones, ur: NEGATIVE mg/dL
Leukocytes,Ua: NEGATIVE
Nitrite: NEGATIVE
Protein, ur: NEGATIVE mg/dL
Specific Gravity, Urine: 1.01 (ref 1.005–1.030)
WBC, UA: NONE SEEN WBC/hpf (ref 0–5)
pH: 5.5 (ref 5.0–8.0)

## 2018-10-27 MED ORDER — ONDANSETRON 8 MG PO TBDP
8.0000 mg | ORAL_TABLET | Freq: Once | ORAL | Status: AC
  Administered 2018-10-27: 8 mg via ORAL

## 2018-10-27 NOTE — ED Provider Notes (Addendum)
[facility] ____________________________________________  Time seen: Approximately 6:32 PM  I have reviewed the triage vital signs and the nursing notes.   HISTORY  Chief Complaint Abdominal Pain   HPI Ann Morales is a 43 y.o. female presenting for evaluation right lower quadrant abdominal pain that is been present for the last 1 week and increasing in severity.  Reports over the last 2 days pain has increased and is continued to increase today.  Intermittently having her doubled over in pain.  Currently moderate.  States pain is constant, sometimes movement does increase it but it never resolves.  Some accompanying nausea, no vomiting or diarrhea.  No injury.  No dysuria.  Pain does sometimes radiate to back as well as to groin.  No recent cough, congestion, chest pain, shortness of breath or fevers.  Also in screening questions, patient reports recent suicidal thoughts.  Patient reports this is a chronic issue for her without any acute change.  States she does have intermittent suicidal ideations, but denies any plans.  Patient further states that she will not hurt herself.  Patient reports that she follows with a psychiatrist and they are aware of this and is continue to follow with them.  Denies any homicidal ideations.  Carmon Ginsberg, PA: PCP  Past Medical History:  Diagnosis Date  . ADHD (attention deficit hyperactivity disorder)   . Anxiety   . Arthritis   . Asthma   . Bipolar disorder ([place])   . Depression   . GERD (gastroesophageal reflux disease)   . Headache    migraines  . Sleep apnea    cpap    Patient Active Problem List   Diagnosis Date Noted  . Fusion of spine of cervical region 03/13/2017  . Cervical disc disorder with radiculopathy 02/25/2017  . Lymphedema 04/24/2016  . Varicose veins of bilateral lower extremities with pain 04/08/2016  . Chronic venous insufficiency 04/08/2016  . OSA (obstructive sleep apnea) 04/04/2016  . Adult ADHD  01/18/2016  . Bipolar I disorder, current or most recent episode depressed, with psychotic features ([place]) 01/18/2016  . Other specified anxiety disorders 01/18/2016  . Allergic rhinitis 01/15/2015  . Current smoker 01/15/2015  . H/O manic depressive disorder 01/15/2015  . H/O renal calculi 01/15/2015  . Asthma 01/15/2015  . HLD (hyperlipidemia) 10/25/2013    Past Surgical History:  Procedure Laterality Date  . BACK SURGERY  03/2017   cervical fusion  . CHOLECYSTECTOMY    . GALLBLADDER SURGERY    . PLANTAR FASCIA RELEASE Left 06/12/2017   Procedure: ENDOSCOPIC PLANTAR FASCIOTOMY-RELEASE;  Surgeon: Samara Deist, DPM;  Location: [facility];  Service: Podiatry;  Laterality: Left;  . REPAIR EXTENSOR TENDON Left 06/12/2017   Procedure: REPAIR FLEXOR TENDON;  Surgeon: Samara Deist, DPM;  Location: [facility];  Service: Podiatry;  Laterality: Left;  . TARSAL TUNNEL RELEASE Left 06/12/2017   Procedure: TARSAL TUNNEL RELEASE-BAXTER RELEASE;  Surgeon: Samara Deist, DPM;  Location: [facility];  Service: Podiatry;  Laterality: Left;  . TUBAL LIGATION       No current facility-administered medications for this encounter.   Current Outpatient Medications:  .  amphetamine-dextroamphetamine (ADDERALL) 10 MG tablet, Take 10 mg by mouth daily at 2 PM., Disp: , Rfl: 0 .  buPROPion (WELLBUTRIN XL) 300 MG 24 hr tablet, Take 300 mg by mouth daily. , Disp: , Rfl: 1 .  clonazePAM (KLONOPIN) 0.5 MG tablet, Take 1 tablet (0.5 mg total) by mouth 2 (two) times daily as needed for anxiety. (Patient  taking differently: Take 0.5 mg by mouth 2 (two) times daily. ), Disp: 60 tablet, Rfl: 3 .  ibuprofen (ADVIL) 800 MG tablet, TAKE 1 TABLET (800 MG TOTAL) BY MOUTH EVERY 6 (SIX) HOURS AS NEEDED FOR PAIN, Disp: 30 tablet, Rfl: 3 .  ibuprofen (ADVIL,MOTRIN) 200 MG tablet, Take 800 mg by mouth every 8 (eight) hours as needed (for pain.)., Disp: , Rfl:  .  LATUDA 40 MG TABS tablet, Take 40 mg by mouth daily after supper. ,  Disp: , Rfl: 2 .  lisdexamfetamine (VYVANSE) 40 MG capsule, Take 40 mg by mouth daily. , Disp: , Rfl:  .  lithium 300 MG tablet, Take 300 mg by mouth 2 (two) times daily., Disp: , Rfl: 2 .  loratadine (CLARITIN) 10 MG tablet, One daily as needed for allergies, Disp: 30 tablet, Rfl: 5 .  omeprazole (PRILOSEC) 20 MG capsule, Take 1 capsule (20 mg total) by mouth daily., Disp: 90 capsule, Rfl: 1 .  SUMAtriptan (IMITREX) 50 MG tablet, Take one tablet at onset of headache; may repeat in 2 hours if not better, Disp: 10 tablet, Rfl: 2 .  traZODone (DESYREL) 150 MG tablet, Take 150 mg by mouth at bedtime. , Disp: , Rfl: 1 .  TRINTELLIX 10 MG TABS, Take 10 mg by mouth daily. , Disp: , Rfl: 2 .  albuterol (PROVENTIL HFA;VENTOLIN HFA) 108 (90 Base) MCG/ACT inhaler, Inhale 2 puffs into the lungs every 6 (six) hours as needed for wheezing or shortness of breath., Disp: 1 Inhaler, Rfl: 5 .  trimethoprim-polymyxin b (POLYTRIM) ophthalmic solution, 1-2 drops every 4-6 hours (max 6 drops/day), Disp: 10 mL, Rfl: 0  Allergies Codeine, Azithromycin, and Sulfa antibiotics  Family History  Problem Relation Age of Onset  . Hypercholesterolemia Father   . Bipolar disorder Mother   . Hypercholesterolemia Maternal Grandmother   . Hypertension Maternal Grandmother   . Anxiety disorder Maternal Grandmother   . Depression Maternal Grandmother     Social History Social History   Tobacco Use  . Smoking status: Current Every Day Smoker    Packs/day: 2.00    Years: 15.00    Pack years: 30.00    Types: Cigarettes    Start date: 09/25/1994  . Smokeless tobacco: Never Used  Substance Use Topics  . Alcohol use: No    Alcohol/week: 0.0 standard drinks  . Drug use: No    Review of Systems Constitutional: No fever ENT: No sore throat. Cardiovascular: Denies chest pain. Respiratory: Denies shortness of breath. Gastrointestinal: positive abdominal pain.  Positive nausea, no vomiting.  No diarrhea.  No  constipation. Genitourinary: Negative for dysuria. Musculoskeletal: Positive for back pain. Skin: Negative for rash.  ____________________________________________   PHYSICAL EXAM:  VITAL SIGNS: ED Triage Vitals  Enc Vitals Group     BP 10/27/18 1732 130/88     Pulse Rate 10/27/18 1732 80     Resp 10/27/18 1732 18     Temp 10/27/18 1732 99 F (37.2 C)     Temp Source 10/27/18 1732 Oral     SpO2 10/27/18 1732 99 %     Weight 10/27/18 1726 225 lb (102.1 kg)     Height 10/27/18 1726 5\' 7"  (1.702 m)     Head Circumference --      Peak Flow --      Pain Score 10/27/18 1726 10     Pain Loc --      Pain Edu? --      Excl. in [place]? --  Constitutional: Alert and oriented. Well appearing and in no acute distress. Eyes: Conjunctivae are normal.  ENT      Head: Normocephalic and atraumatic. Cardiovascular: Normal rate, regular rhythm. Grossly normal heart sounds.  Good peripheral circulation. Respiratory: Normal respiratory effort without tachypnea nor retractions. Breath sounds are clear and equal bilaterally. No wheezes, rales, rhonchi. Gastrointestinal:  Normal Bowel sounds. No CVA tenderness.Moderate tenderness to RLQ and suprapubic. Guarding abdomen.  Neurologic:  Normal speech and language. Speech is normal. No gait instability.  Skin:  Skin is warm, dry and intact. No rash noted. Psychiatric: Mood and affect are normal. Speech and behavior are normal. Patient exhibits appropriate insight and judgment   ___________________________________________   LABS (all labs ordered are listed, but only abnormal results are displayed)  Labs Reviewed  URINALYSIS, COMPLETE (UACMP) WITH MICROSCOPIC - Abnormal; Notable for the following components:      Result Value   Color, Urine STRAW (*)    Hgb urine dipstick MODERATE (*)    All other components within normal limits   PROCEDURES Procedures   INITIAL IMPRESSION / ASSESSMENT AND PLAN / ED COURSE  Pertinent labs & imaging results  that were available during my care of the patient were reviewed by me and considered in my medical decision making (see chart for details).  Presenting for evaluation of abdominal pain.  Discussed multiple differentials with patient including appendicitis, PID, nephrolithiasis and UTI.  Discussed in detail with patient, recommend patient need further evaluation and imaging in the emergency room at this time.  Patient states that her fianc will take her directly to [facility].  Recommend to remain n.p.o.  Also strongly encouraged patient to continue to closely follow-up with her psychiatry.  Patient agreed to this.  8mg  Zofran given once in urgent care. ____________________________________________   FINAL CLINICAL IMPRESSION(S) / ED DIAGNOSES  Final diagnoses:  RLQ abdominal pain     ED Discharge Orders    None       Note: This dictation was prepared with Dragon dictation along with smaller phrase technology. Any transcriptional errors that result from this process are unintentional.            Marylene Land, NP 10/27/18 1946

## 2018-10-27 NOTE — Discharge Instructions (Addendum)
Go directly to the emergency room at this time.  Do not eat or drink anything.  As discussed please continue to follow-up closely with your psychiatrist.

## 2018-10-27 NOTE — ED Triage Notes (Signed)
Patient complains of right lower quadrant pain that radiates to groin, flank and back. Patient states that this originally started around 1 week ago but worsened over the last 3 days and she has felt nauseated from the pain.

## 2018-11-04 ENCOUNTER — Ambulatory Visit: Payer: Medicaid Other | Admitting: Physician Assistant

## 2018-11-04 ENCOUNTER — Encounter: Payer: Self-pay | Admitting: Physician Assistant

## 2018-11-04 ENCOUNTER — Other Ambulatory Visit: Payer: Self-pay

## 2018-11-04 VITALS — BP 99/69 | HR 66 | Temp 97.1°F | Resp 16 | Ht 67.0 in | Wt 217.0 lb

## 2018-11-04 DIAGNOSIS — L409 Psoriasis, unspecified: Secondary | ICD-10-CM | POA: Diagnosis not present

## 2018-11-04 DIAGNOSIS — R1031 Right lower quadrant pain: Secondary | ICD-10-CM | POA: Diagnosis not present

## 2018-11-04 DIAGNOSIS — N83201 Unspecified ovarian cyst, right side: Secondary | ICD-10-CM

## 2018-11-04 MED ORDER — TRIAMCINOLONE ACETONIDE 0.1 % EX CREA: 1.0000 "application " | TOPICAL_CREAM | Freq: Two times a day (BID) | CUTANEOUS | 0 refills | Status: DC

## 2018-11-04 MED ORDER — MELOXICAM 7.5 MG PO TABS: ORAL_TABLET | ORAL | 0 refills | Status: DC

## 2018-11-04 NOTE — Progress Notes (Signed)
Patient: Ann Morales Female    DOB: [birth year]   43 y.o.   MRN: [id number] Visit Date: 11/04/2018  Today's Provider: Trinna Post, PA-C   Chief Complaint  Patient presents with  . Hospitalization Follow-up   Subjective:     HPI    Follow up ER visit  Patient was seen in ER for flank pain on 10/27/2019. She was treated for flank pain Treatment for this included ct ordered-normal, given fluids and toradol.. She reports good compliance with treatment. She reports this condition is Unchanged.  Reports abdominal pain ongoing for one month. Started gradually. Hurts in Right lower quadrant and travels laterally. Painful to the touch. Very nauseated but no vomiting. No diarrhea or constipation. No fevers, chills. Denies history of PCOS. Reports she has heavy periods that do occur on a regular basis. Mother with endometriosis. She saw ER on 10/27/2018 and labwork and testing was unremarkable except for 1.3 cm cyst on ovary> She denies new sexual partners or vaginal discharge. Denies fevers, chills. Has tried Bentyl for the symptoms without relief.  -----------------------------------------------------------  Patient states she is not feeling any better. Patient states she is still having abdominal pain.  Allergies  Allergen Reactions  . Codeine Hives and Shortness Of Breath  . Azithromycin Diarrhea  . Sulfa Antibiotics Other (See Comments)    Severe abdominal pain.     Current Outpatient Medications:  .  albuterol (PROVENTIL HFA;VENTOLIN HFA) 108 (90 Base) MCG/ACT inhaler, Inhale 2 puffs into the lungs every 6 (six) hours as needed for wheezing or shortness of breath., Disp: 1 Inhaler, Rfl: 5 .  amphetamine-dextroamphetamine (ADDERALL) 10 MG tablet, Take 10 mg by mouth daily at 2 PM., Disp: , Rfl: 0 .  buPROPion (WELLBUTRIN XL) 300 MG 24 hr tablet, Take 300 mg by mouth daily. , Disp: , Rfl: 1 .  clonazePAM (KLONOPIN) 0.5 MG tablet, Take 1 tablet (0.5 mg total) by  mouth 2 (two) times daily as needed for anxiety. (Patient taking differently: Take 0.5 mg by mouth 2 (two) times daily. ), Disp: 60 tablet, Rfl: 3 .  dicyclomine (BENTYL) 20 MG tablet, Take by mouth., Disp: , Rfl:  .  ibuprofen (ADVIL) 800 MG tablet, TAKE 1 TABLET (800 MG TOTAL) BY MOUTH EVERY 6 (SIX) HOURS AS NEEDED FOR PAIN, Disp: 30 tablet, Rfl: 3 .  ibuprofen (ADVIL,MOTRIN) 200 MG tablet, Take 800 mg by mouth every 8 (eight) hours as needed (for pain.)., Disp: , Rfl:  .  LATUDA 40 MG TABS tablet, Take 40 mg by mouth daily after supper. , Disp: , Rfl: 2 .  lisdexamfetamine (VYVANSE) 40 MG capsule, Take 40 mg by mouth daily. , Disp: , Rfl:  .  lithium 300 MG tablet, Take 300 mg by mouth 2 (two) times daily., Disp: , Rfl: 2 .  loratadine (CLARITIN) 10 MG tablet, One daily as needed for allergies, Disp: 30 tablet, Rfl: 5 .  omeprazole (PRILOSEC) 20 MG capsule, Take 1 capsule (20 mg total) by mouth daily., Disp: 90 capsule, Rfl: 1 .  SUMAtriptan (IMITREX) 50 MG tablet, Take one tablet at onset of headache; may repeat in 2 hours if not better, Disp: 10 tablet, Rfl: 2 .  traZODone (DESYREL) 150 MG tablet, Take 150 mg by mouth at bedtime. , Disp: , Rfl: 1 .  TRINTELLIX 10 MG TABS, Take 10 mg by mouth daily. , Disp: , Rfl: 2 .  trimethoprim-polymyxin b (POLYTRIM) ophthalmic solution, 1-2 drops every 4-6 hours (  max 6 drops/day) (Patient not taking: Reported on 11/04/2018), Disp: 10 mL, Rfl: 0  Review of Systems  Constitutional: Negative for appetite change, chills, fatigue and fever.  Respiratory: Negative for chest tightness and shortness of breath.   Cardiovascular: Negative for chest pain and palpitations.  Gastrointestinal: Negative for abdominal pain, nausea and vomiting.  Neurological: Negative for dizziness and weakness.    Social History   Tobacco Use  . Smoking status: Current Every Day Smoker    Packs/day: 2.00    Years: 15.00    Pack years: 30.00    Types: Cigarettes    Start date:  09/25/1994  . Smokeless tobacco: Never Used  Substance Use Topics  . Alcohol use: No    Alcohol/week: 0.0 standard drinks      Objective:   BP 99/69 (BP Location: Right Arm, Patient Position: Sitting, Cuff Size: Large)   Pulse 66   Temp (!) 97.1 F (36.2 C) (Other (Comment))   Resp 16   Ht 5\' 7"  (1.702 m)   Wt 217 lb (98.4 kg)   LMP 10/09/2018   SpO2 97%   BMI 33.99 kg/m  Vitals:   11/04/18 1442  BP: 99/69  Pulse: 66  Resp: 16  Temp: (!) 97.1 F (36.2 C)  TempSrc: Other (Comment)  SpO2: 97%  Weight: 217 lb (98.4 kg)  Height: 5\' 7"  (1.702 m)     Physical Exam Constitutional:      Appearance: Normal appearance.  Cardiovascular:     Rate and Rhythm: Normal rate and regular rhythm.     Heart sounds: Normal heart sounds.  Pulmonary:     Effort: Pulmonary effort is normal.     Breath sounds: Normal breath sounds.  Abdominal:     General: There is no distension.     Palpations: There is no mass.     Tenderness: There is abdominal tenderness in the right lower quadrant. There is no right CVA tenderness, left CVA tenderness or guarding.     Comments: RLQ pain with deep palpation.   Skin:    General: Skin is warm and dry.     Findings: Rash present.     Comments: Psoriatic plaques present on elbows bilaterally.   Neurological:     Mental Status: She is alert and oriented to person, place, and time. Mental status is at baseline.  Psychiatric:        Mood and Affect: Mood normal.        Behavior: Behavior normal.      No results found for any visits on 11/04/18.     Assessment & Plan    1. Cyst of right ovary  I think she needs a pelvic exam, PAP, and swab. I have offered to do this for her today but she would like to hold off until she sees gynecologist.   2. Right lower quadrant abdominal pain  Will give meloxicam. This may interact with her lithium but she has been taking ibuprofen without issue. She is getting her lithium checked in several weeks. Have  cautioned her about lithium toxicity.   3. Psoriasis  She is having a flare of psoriasis on her elbows and is requesting steroid cream. I have sent this in.       Trinna Post, PA-C  [facility]

## 2018-11-04 NOTE — Patient Instructions (Signed)
Psoriasis Psoriasis is a long-term (chronic) skin condition. It occurs because your body's defense system (immune system) causes skin cells to form too quickly. This causes raised, red patches (plaques) on your skin that look silvery. The patches may be on all areas of your body. They can be any size or shape. Psoriasis can come and go. It can range from mild to very bad. It cannot be passed from one person to another (is not contagious). There is no cure for this condition, but it can be helped with treatment. What are the causes? The cause of psoriasis is not known. Some things can make it worse. These are:  Skin damage, such as cuts, scrapes, sunburn, and dryness.  Not getting enough sunlight.  Some medicines.  Alcohol.  Tobacco.  Stress.  Infections. What increases the risk?  Having a family member with psoriasis.  Being very overweight (obese).  Being 20-40 years old.  Taking certain medicines. What are the signs or symptoms? There are different types of psoriasis. The types are:  Plaque. This is the most common. Symptoms include red, raised patches with a silvery coating. These may be itchy. Your nails may be crumbly or fall off.  Guttate. Symptoms include small red spots on your stomach area, arms, and legs. These may happen after you have been sick, such as with strep throat.  Inverse. Symptoms include patches in your armpits, under your breasts, private areas, or on your butt.  Pustular. Symptoms include pus-filled bumps on the palms of your hands or the soles of your feet. You also may feel very tired, weak, have a fever, and not be hungry.  Erythrodermic. Symptoms include bright red skin that looks burned. You may have a fast heartbeat and a body temperature that is too high or too low. You may be itchy or in pain.  Sebopsoriasis. Symptoms include red patches on your scalp, forehead, and face that are greasy.  Psoriatic arthritis. Symptoms include swollen,  painful joints along with scaly skin patches. How is this treated? There is no cure for this condition, but treatment can:  Help your skin heal.  Lessen itching and irritation and swelling (inflammation).  Slow the growth of new skin cells.  Help your body's defense system respond better to your skin. Treatment may include:  Creams or ointments.  Light therapy. This may include natural sunlight or light therapy in a doctor's office.  Medicines. These can help your body better manage skin cells. They may be used with light therapy or ointments. Medicines may include pills or injections. You may also get antibiotic medicines if you have an infection. Follow these instructions at home: Skin Care  Apply lotion to your skin as needed. Only use those that your doctor has said are okay.  Apply cool, wet cloths (cold compresses) to the affected areas.  Do not use a hot tub or take hot showers. Use slightly warm, not hot, water when taking showers and baths.  Do not scratch your skin. Lifestyle   Do not use any products that contain nicotine or tobacco, such as cigarettes, e-cigarettes, and chewing tobacco. If you need help quitting, ask your doctor.  Lower your stress.  Keep a healthy weight.  Go out in the sun as told by your doctor. Do not get sunburned.  Join a support group. Medicines  Take or use over-the-counter and prescription medicines only as told by your doctor.  If you were prescribed an antibiotic medicine, take it as told by your doctor.   Do not stop using the antibiotic even if you start to feel better. Alcohol use If you drink alcohol:  Limit how much you use: ? 0-1 drink a day for women. ? 0-2 drinks a day for men.  Be aware of how much alcohol is in your drink. In the [place] one drink equals one 12 oz bottle of beer (355 mL), one 5 oz glass of wine (148 mL), or one 1 oz glass of hard liquor (44 mL). General instructions  Keep a journal to track the  things that cause symptoms (triggers). Try to avoid these things.  See a counselor if you feel the support would help.  Keep all follow-up visits as told by your doctor. This is important. Contact a doctor if:  You have a fever.  Your pain gets worse.  You have more redness or warmth in the affected areas.  You have new or worse pain or stiffness in your joints.  Your nails start to break easily or pull away from the nail bed.  You feel very sad (depressed). Summary  Psoriasis is a long-term (chronic) skin condition.  There is no cure for this condition, but treatment can help manage it.  Keep a journal to track the things that cause symptoms.  Take or use over-the-counter and prescription medicines only as told by your doctor.  Keep all follow-up visits as told by your doctor. This is important. This information is not intended to replace advice given to you by your health care provider. Make sure you discuss any questions you have with your health care provider. Document Released: 04/03/2004 Document Revised: 12/29/2017 Document Reviewed: 12/29/2017 Elsevier Patient Education  2020 [employer]  

## 2018-11-11 ENCOUNTER — Encounter: Payer: Self-pay | Admitting: Obstetrics and Gynecology

## 2018-11-12 ENCOUNTER — Telehealth: Payer: Self-pay

## 2018-11-12 NOTE — Telephone Encounter (Signed)
Patient states she was advised of the message from someone in the office already.

## 2018-11-12 NOTE — Telephone Encounter (Signed)
Patient notified. She will go to an urgent care.

## 2018-11-12 NOTE — Telephone Encounter (Signed)
Can be seen Monday or see her GYN or go to UC. We do not have any availability unfortunately.

## 2018-11-12 NOTE — Telephone Encounter (Signed)
Patient states that she is having burning and itching in the vaginal area for about a week, worse the last 2 days. Some redness and irritation, burning with urination.

## 2018-11-14 ENCOUNTER — Other Ambulatory Visit: Payer: Self-pay

## 2018-11-14 ENCOUNTER — Ambulatory Visit
Admission: EM | Admit: 2018-11-14 | Discharge: 2018-11-14 | Disposition: A | Discharge status: Home / Self Care | Payer: Medicaid Other | Attending: Emergency Medicine | Admitting: Emergency Medicine

## 2018-11-14 ENCOUNTER — Encounter: Payer: Self-pay | Admitting: Emergency Medicine

## 2018-11-14 DIAGNOSIS — B373 Candidiasis of vulva and vagina: Secondary | ICD-10-CM | POA: Diagnosis present

## 2018-11-14 DIAGNOSIS — R319 Hematuria, unspecified: Secondary | ICD-10-CM | POA: Diagnosis present

## 2018-11-14 DIAGNOSIS — B3731 Acute candidiasis of vulva and vagina: Secondary | ICD-10-CM

## 2018-11-14 DIAGNOSIS — A5901 Trichomonal vulvovaginitis: Secondary | ICD-10-CM | POA: Insufficient documentation

## 2018-11-14 DIAGNOSIS — N390 Urinary tract infection, site not specified: Secondary | ICD-10-CM | POA: Insufficient documentation

## 2018-11-14 DIAGNOSIS — B9689 Other specified bacterial agents as the cause of diseases classified elsewhere: Secondary | ICD-10-CM

## 2018-11-14 DIAGNOSIS — Z113 Encounter for screening for infections with a predominantly sexual mode of transmission: Secondary | ICD-10-CM | POA: Insufficient documentation

## 2018-11-14 DIAGNOSIS — N760 Acute vaginitis: Secondary | ICD-10-CM | POA: Insufficient documentation

## 2018-11-14 LAB — URINALYSIS, COMPLETE (UACMP) WITH MICROSCOPIC
Bilirubin Urine: NEGATIVE
Glucose, UA: NEGATIVE mg/dL
Ketones, ur: NEGATIVE mg/dL
Nitrite: NEGATIVE
RBC / HPF: >50 RBC/hpf (ref 0–5)
Specific Gravity, Urine: >1.03 — ABNORMAL HIGH (ref 1.005–1.030)
pH: 5 (ref 5.0–8.0)

## 2018-11-14 LAB — WET PREP, GENITAL: Sperm: NONE SEEN

## 2018-11-14 MED ORDER — METRONIDAZOLE 500 MG PO TABS: 500.0000 mg | ORAL_TABLET | Freq: Two times a day (BID) | ORAL | 0 refills | Status: DC

## 2018-11-14 MED ORDER — FLUCONAZOLE 150 MG PO TABS: 150.0000 mg | ORAL_TABLET | Freq: Every day | ORAL | 0 refills | Status: DC

## 2018-11-14 MED ORDER — CEPHALEXIN 500 MG PO CAPS: 500.0000 mg | ORAL_CAPSULE | Freq: Two times a day (BID) | ORAL | 0 refills | Status: AC

## 2018-11-14 NOTE — ED Provider Notes (Signed)
[facility] ____________________________________________  Time seen: Approximately 4:24 PM  I have reviewed the triage vital signs and the nursing notes.   HISTORY  Chief Complaint Dysuria and Vaginal Discharge   HPI Ann Morales is a 43 y.o. female presenting for evaluation of approximately 1 week of urinary frequency, urinary urgency and some burning with urination.  Reports in the last few days she has had more vaginal burning as well as continued urinary burning.  Also occasional vaginal discharge and itching.  Reports she tried a 3-day course of Monistat without resolution.  Reports sexually active with one partner.  Denied concerns of STDs.  Patient reports that she has had continued lower abdominal pain, particular in the right side that has been ongoing for the last 1 to 2 months but had a negative CT scan and was found to then have a right ovarian cyst.  States that she is following up with her OB/GYN regarding this on this Wednesday.  Denies any acute changes in her abdominal pain and states feels unchanged.  Denies recent chest pain, shortness of breath, fever or other complaints.  Trinna Post, PA-C: PCP   Past Medical History:  Diagnosis Date  . ADHD (attention deficit hyperactivity disorder)   . Anxiety   . Arthritis   . Asthma   . Bipolar disorder ([place])   . Depression   . GERD (gastroesophageal reflux disease)   . Headache    migraines  . Sleep apnea    cpap    Patient Active Problem List   Diagnosis Date Noted  . Fusion of spine of cervical region 03/13/2017  . Cervical disc disorder with radiculopathy 02/25/2017  . Lymphedema 04/24/2016  . Varicose veins of bilateral lower extremities with pain 04/08/2016  . Chronic venous insufficiency 04/08/2016  . OSA (obstructive sleep apnea) 04/04/2016  . Adult ADHD 01/18/2016  . Bipolar I disorder, current or most recent episode depressed, with psychotic features ([place]) 01/18/2016  . Other  specified anxiety disorders 01/18/2016  . Allergic rhinitis 01/15/2015  . Current smoker 01/15/2015  . H/O manic depressive disorder 01/15/2015  . H/O renal calculi 01/15/2015  . Asthma 01/15/2015  . HLD (hyperlipidemia) 10/25/2013    Past Surgical History:  Procedure Laterality Date  . BACK SURGERY  03/2017   cervical fusion  . CHOLECYSTECTOMY    . GALLBLADDER SURGERY    . PLANTAR FASCIA RELEASE Left 06/12/2017   Procedure: ENDOSCOPIC PLANTAR FASCIOTOMY-RELEASE;  Surgeon: Samara Deist, DPM;  Location: [facility];  Service: Podiatry;  Laterality: Left;  . REPAIR EXTENSOR TENDON Left 06/12/2017   Procedure: REPAIR FLEXOR TENDON;  Surgeon: Samara Deist, DPM;  Location: [facility];  Service: Podiatry;  Laterality: Left;  . TARSAL TUNNEL RELEASE Left 06/12/2017   Procedure: TARSAL TUNNEL RELEASE-BAXTER RELEASE;  Surgeon: Samara Deist, DPM;  Location: [facility];  Service: Podiatry;  Laterality: Left;  . TUBAL LIGATION       No current facility-administered medications for this encounter.   Current Outpatient Medications:  .  amphetamine-dextroamphetamine (ADDERALL) 10 MG tablet, Take 10 mg by mouth daily at 2 PM., Disp: , Rfl: 0 .  buPROPion (WELLBUTRIN XL) 300 MG 24 hr tablet, Take 300 mg by mouth daily. , Disp: , Rfl: 1 .  clonazePAM (KLONOPIN) 0.5 MG tablet, Take 1 tablet (0.5 mg total) by mouth 2 (two) times daily as needed for anxiety. (Patient taking differently: Take 0.5 mg by mouth 2 (two) times daily. ), Disp: 60 tablet, Rfl: 3 .  LATUDA  40 MG TABS tablet, Take 40 mg by mouth daily after supper. , Disp: , Rfl: 2 .  lisdexamfetamine (VYVANSE) 40 MG capsule, Take 40 mg by mouth daily. , Disp: , Rfl:  .  lithium 300 MG tablet, Take 300 mg by mouth 2 (two) times daily., Disp: , Rfl: 2 .  loratadine (CLARITIN) 10 MG tablet, One daily as needed for allergies, Disp: 30 tablet, Rfl: 5 .  meloxicam (MOBIC) 7.5 MG tablet, Take one to tablets daily for pain., Disp: 30 tablet, Rfl: 0 .   omeprazole (PRILOSEC) 20 MG capsule, Take 1 capsule (20 mg total) by mouth daily., Disp: 90 capsule, Rfl: 1 .  SUMAtriptan (IMITREX) 50 MG tablet, Take one tablet at onset of headache; may repeat in 2 hours if not better, Disp: 10 tablet, Rfl: 2 .  traZODone (DESYREL) 150 MG tablet, Take 150 mg by mouth at bedtime. , Disp: , Rfl: 1 .  TRINTELLIX 10 MG TABS, Take 10 mg by mouth daily. , Disp: , Rfl: 2 .  albuterol (PROVENTIL HFA;VENTOLIN HFA) 108 (90 Base) MCG/ACT inhaler, Inhale 2 puffs into the lungs every 6 (six) hours as needed for wheezing or shortness of breath., Disp: 1 Inhaler, Rfl: 5 .  cephALEXin (KEFLEX) 500 MG capsule, Take 1 capsule (500 mg total) by mouth 2 (two) times daily for 7 days., Disp: 14 capsule, Rfl: 0 .  dicyclomine (BENTYL) 20 MG tablet, Take by mouth., Disp: , Rfl:  .  fluconazole (DIFLUCAN) 150 MG tablet, Take 1 tablet (150 mg total) by mouth daily. Take one pill orally, then Repeat in one week as needed., Disp: 2 tablet, Rfl: 0 .  ibuprofen (ADVIL) 800 MG tablet, TAKE 1 TABLET (800 MG TOTAL) BY MOUTH EVERY 6 (SIX) HOURS AS NEEDED FOR PAIN, Disp: 30 tablet, Rfl: 3 .  ibuprofen (ADVIL,MOTRIN) 200 MG tablet, Take 800 mg by mouth every 8 (eight) hours as needed (for pain.)., Disp: , Rfl:  .  metroNIDAZOLE (FLAGYL) 500 MG tablet, Take 1 tablet (500 mg total) by mouth 2 (two) times daily., Disp: 14 tablet, Rfl: 0 .  triamcinolone cream (KENALOG) 0.1 %, Apply 1 application topically 2 (two) times daily., Disp: 30 g, Rfl: 0 .  trimethoprim-polymyxin b (POLYTRIM) ophthalmic solution, 1-2 drops every 4-6 hours (max 6 drops/day) (Patient not taking: Reported on 11/04/2018), Disp: 10 mL, Rfl: 0  Allergies Codeine, Azithromycin, and Sulfa antibiotics  Family History  Problem Relation Age of Onset  . Hypercholesterolemia Father   . Bipolar disorder Mother   . Hypercholesterolemia Maternal Grandmother   . Hypertension Maternal Grandmother   . Anxiety disorder Maternal Grandmother    . Depression Maternal Grandmother     Social History Social History   Tobacco Use  . Smoking status: Current Every Day Smoker    Packs/day: 2.00    Years: 15.00    Pack years: 30.00    Types: Cigarettes    Start date: 09/25/1994  . Smokeless tobacco: Never Used  Substance Use Topics  . Alcohol use: No    Alcohol/week: 0.0 standard drinks  . Drug use: No    Review of Systems Constitutional: No fever ENT: No sore throat. Cardiovascular: Denies chest pain. Respiratory: Denies shortness of breath. Gastrointestinal: No nausea, no vomiting.  No diarrhea.  No constipation. Genitourinary: Positive for dysuria. Musculoskeletal: Negative for back pain. Skin: Negative for rash.   ____________________________________________   PHYSICAL EXAM:  VITAL SIGNS: ED Triage Vitals  Enc Vitals Group     BP 11/14/18  1529 109/76     Pulse Rate 11/14/18 1529 72     Resp 11/14/18 1529 16     Temp 11/14/18 1529 98.1 F (36.7 C)     Temp Source 11/14/18 1529 Oral     SpO2 11/14/18 1529 99 %     Weight 11/14/18 1525 220 lb (99.8 kg)     Height 11/14/18 1525 5\' 7"  (1.702 m)     Head Circumference --      Peak Flow --      Pain Score 11/14/18 1525 7     Pain Loc --      Pain Edu? --      Excl. in [place]? --     Constitutional: Alert and oriented. Well appearing and in no acute distress. Eyes: Conjunctivae are normal. ENT      Head: Normocephalic and atraumatic. Cardiovascular: Normal rate, regular rhythm. Grossly normal heart sounds.  Good peripheral circulation. Respiratory: Normal respiratory effort without tachypnea nor retractions. Breath sounds are clear and equal bilaterally. No wheezes, rales, rhonchi. Gastrointestinal: Normal Bowel sounds.  Mild diffuse lower abdominal tenderness palpation.  No CVA tenderness. Musculoskeletal: Steady gait. Neurologic:  Normal speech and language.  Skin:  Skin is warm, dry and intact. No rash noted. Psychiatric: Mood and affect are normal.  Speech and behavior are normal. Patient exhibits appropriate insight and judgment   ___________________________________________   LABS (all labs ordered are listed, but only abnormal results are displayed)  Labs Reviewed  WET PREP, GENITAL - Abnormal; Notable for the following components:      Result Value   Yeast Wet Prep HPF POC PRESENT (*)    Trich, Wet Prep PRESENT (*)    Clue Cells Wet Prep HPF POC PRESENT (*)    WBC, Wet Prep HPF POC MODERATE (*)    All other components within normal limits  URINALYSIS, COMPLETE (UACMP) WITH MICROSCOPIC - Abnormal; Notable for the following components:   APPearance CLOUDY (*)    Specific Gravity, Urine >1.030 (*)    Hgb urine dipstick MODERATE (*)    Protein, ur TRACE (*)    Leukocytes,Ua SMALL (*)    Bacteria, UA MANY (*)    Trichomonas, UA PRESENT (*)    All other components within normal limits  GC/CHLAMYDIA PROBE AMP  URINE CULTURE  HIV ANTIBODY (ROUTINE TESTING W REFLEX)  RPR     PROCEDURES Procedures   INITIAL IMPRESSION / ASSESSMENT AND PLAN / ED COURSE  Pertinent labs & imaging results that were available during my care of the patient were reviewed by me and considered in my medical decision making (see chart for details).  Well-appearing patient.  No acute distress.  Presenting for evaluation of dysuria and vaginal itching.  Urinalysis was reviewed noting urinary infection, yeast as well as trichomonas.  Patient elected for self wet prep.  Patient wet prep further showing yeast vaginitis, trichomonas and bacterial vaginosis.  Will culture urine.  Will treat with oral Keflex as well as Flagyl twice daily x7 days.  Diflucan.  Further discussed with patient and will also test for gonorrhea, chlamydia, HIV and syphilis, patient agrees.  Discussed partner to be informed and also treated.  No sexual activity for minimum 1 week.  Keep follow-up appointment this Wednesday with her OB/GYN.  Rest, fluids and supportive care.Discussed  indication, risks and benefits of medications with patient.  Discussed follow up and return parameters including no resolution or any worsening concerns. Patient verbalized understanding and agreed to plan.  ____________________________________________   FINAL CLINICAL IMPRESSION(S) / ED DIAGNOSES  Final diagnoses:  Urinary tract infection with hematuria, site unspecified  Yeast vaginitis  Bacterial vaginosis  Trichomonas vaginitis  Screen for STD (sexually transmitted disease)     ED Discharge Orders         Ordered    cephALEXin (KEFLEX) 500 MG capsule  2 times daily     11/14/18 1626    fluconazole (DIFLUCAN) 150 MG tablet  Daily     11/14/18 1626    metroNIDAZOLE (FLAGYL) 500 MG tablet  2 times daily     11/14/18 1626           Note: This dictation was prepared with Dragon dictation along with smaller phrase technology. Any transcriptional errors that result from this process are unintentional.         Marylene Land, NP 11/14/18 1640

## 2018-11-14 NOTE — Discharge Instructions (Addendum)
Take medication as prescribed.  No sexual activity for at least 1 week.  Please inform partner is a need to be treated.  Follow-up with your OB/GYN this week as discussed.  Drink plenty of fluids.  Follow up with your primary care physician this week as needed. Return to Urgent care for new or worsening concerns.

## 2018-11-14 NOTE — ED Triage Notes (Signed)
Patient c/o burning when urinating and itching that started 4 days.  Patient reports vaginal discharge.  Patient states that she has tried Monistat but has not helped.

## 2018-11-15 LAB — URINE CULTURE

## 2018-11-16 LAB — HIV ANTIBODY (ROUTINE TESTING W REFLEX): HIV Screen 4th Generation wRfx: NONREACTIVE

## 2018-11-16 LAB — RPR: RPR Ser Ql: NONREACTIVE

## 2018-11-17 ENCOUNTER — Encounter: Payer: Self-pay | Admitting: Obstetrics and Gynecology

## 2018-11-19 LAB — GC/CHLAMYDIA PROBE AMP
Chlamydia trachomatis, NAA: NEGATIVE
Neisseria Gonorrhoeae by PCR: NEGATIVE

## 2018-11-25 ENCOUNTER — Encounter: Payer: Self-pay | Admitting: Obstetrics and Gynecology

## 2018-11-25 ENCOUNTER — Ambulatory Visit (INDEPENDENT_AMBULATORY_CARE_PROVIDER_SITE_OTHER): Payer: Medicaid Other | Admitting: Obstetrics and Gynecology

## 2018-11-25 ENCOUNTER — Other Ambulatory Visit: Payer: Self-pay

## 2018-11-25 ENCOUNTER — Other Ambulatory Visit (HOSPITAL_COMMUNITY)
Admission: RE | Admit: 2018-11-25 | Discharge: 2018-11-25 | Disposition: A | Discharge status: Home / Self Care | Payer: Medicaid Other | Source: Ambulatory Visit | Attending: Obstetrics and Gynecology | Admitting: Obstetrics and Gynecology

## 2018-11-25 VITALS — BP 116/75 | HR 67 | Ht 67.0 in | Wt 224.0 lb

## 2018-11-25 DIAGNOSIS — Z113 Encounter for screening for infections with a predominantly sexual mode of transmission: Secondary | ICD-10-CM | POA: Diagnosis present

## 2018-11-25 DIAGNOSIS — R1031 Right lower quadrant pain: Secondary | ICD-10-CM | POA: Insufficient documentation

## 2018-11-25 DIAGNOSIS — N83201 Unspecified ovarian cyst, right side: Secondary | ICD-10-CM | POA: Diagnosis not present

## 2018-11-25 DIAGNOSIS — Z124 Encounter for screening for malignant neoplasm of cervix: Secondary | ICD-10-CM | POA: Diagnosis present

## 2018-11-25 NOTE — Progress Notes (Signed)
Obstetrics & Gynecology Office Visit   Chief Complaint  Patient presents with  . Ovarian Cyst    Right ovarian cyst  The patient is seen in referral at the request of Pollak, Adriana M, PA-C from [facility] for ovarian cyst.    History of Present Illness: 43 y.o. G59P0010 female who presents in referral from [name], [place], from [facility] for a right ovarian cyst noted on CT scan on 10/28/2018 at [facility]. The cysts measured 1.3 cm on the right ovary.  She has pain on her right side and it has been present for one month, constantly.  She describes the pain as stabbing and twisting.  The pain radiates into her groin a little bit. She rates the pain at times as a 10/10, right now an 8/10. It never goes below a 7/10.  Alleviating factors: nothing. Sometimes she lies down and it gets slightly better.  Aggravating factors: Nothing.  She is being treated for a UTI and trichomonas.   Associated symptoms: nausea without emesis.  She specifically denies fevers, chills, hematochezia, hematuria.  She has never had this type of pain before.   Her last pap smear was 2-3 years ago.  She remembers it being normal.     Past Medical History:  Diagnosis Date  . ADHD (attention deficit hyperactivity disorder)   . Anxiety   . Arthritis   . Asthma   . Bipolar disorder ([place])   . Depression   . GERD (gastroesophageal reflux disease)   . Headache    migraines  . Sleep apnea    cpap    Past Surgical History:  Procedure Laterality Date  . BACK SURGERY  03/2017   cervical fusion  . CHOLECYSTECTOMY    . GALLBLADDER SURGERY    . PLANTAR FASCIA RELEASE Left 06/12/2017   Procedure: ENDOSCOPIC PLANTAR FASCIOTOMY-RELEASE;  Surgeon: Samara Deist, DPM;  Location: [facility];  Service: Podiatry;  Laterality: Left;  . REPAIR EXTENSOR TENDON Left 06/12/2017   Procedure: REPAIR FLEXOR TENDON;  Surgeon: Samara Deist, DPM;  Location: [facility] OR[facility];  Service: Podiatry;  Laterality: Left;  .  TARSAL TUNNEL RELEASE Left 06/12/2017   Procedure: TARSAL TUNNEL RELEASE-BAXTER RELEASE;  Surgeon: Samara Deist, DPM;  Location: [facility] OR[facility];  Service: Podiatry;  Laterality: Left;  . TUBAL LIGATION      Gynecologic History: Patient's last menstrual period was 10/28/2018.  Obstetric HistoryAD:[id number], s/p SVD x2 (1998, 2001) girl then boy  Family History  Problem Relation Age of Onset  . Hypercholesterolemia Father   . Bipolar disorder Mother   . Hypercholesterolemia Maternal Grandmother   . Hypertension Maternal Grandmother   . Anxiety disorder Maternal Grandmother   . Depression Maternal Grandmother     Social History   Socioeconomic History  . Marital status: Single    Spouse name: Not on file  . Number of children: Not on file  . Years of education: Not on file  . Highest education level: Not on file  Occupational History  . Not on file  Social Needs  . Financial resource strain: Not on file  . Food insecurity    Worry: Not on file    Inability: Not on file  . Transportation needs    Medical: Not on file    Non-medical: Not on file  Tobacco Use  . Smoking status: Current Every Day Smoker    Packs/day: 2.00    Years: 15.00    Pack years: 30.00    Types: Cigarettes  Start date: 09/25/1994  . Smokeless tobacco: Never Used  Substance and Sexual Activity  . Alcohol use: No    Alcohol/week: 0.0 standard drinks  . Drug use: No  . Sexual activity: Yes    Birth control/protection: None  Lifestyle  . Physical activity    Days per week: Not on file    Minutes per session: Not on file  . Stress: Not on file  Relationships  . Social Herbalist on phone: Not on file    Gets together: Not on file    Attends religious service: Not on file    Active member of club or organization: Not on file    Attends meetings of clubs or organizations: Not on file    Relationship status: Not on file  . Intimate partner violence    Fear of current or ex partner: Not on  file    Emotionally abused: Not on file    Physically abused: Not on file    Forced sexual activity: Not on file  Other Topics Concern  . Not on file  Social History Narrative  . Not on file    Allergies  Allergen Reactions  . Codeine Hives and Shortness Of Breath  . Azithromycin Diarrhea  . Sulfa Antibiotics Other (See Comments)    Severe abdominal pain.    Prior to Admission medications   Medication Sig Start Date End Date Taking? Authorizing Provider  albuterol (PROVENTIL HFA;VENTOLIN HFA) 108 (90 Base) MCG/ACT inhaler Inhale 2 puffs into the lungs every 6 (six) hours as needed for wheezing or shortness of breath. 11/20/17   Carmon Ginsberg, PA  amphetamine-dextroamphetamine (ADDERALL) 10 MG tablet Take 10 mg by mouth daily at 2 PM. 05/04/17   [provider]  buPROPion (WELLBUTRIN XL) 300 MG 24 hr tablet Take 300 mg by mouth daily.  04/16/16   [provider]  clonazePAM (KLONOPIN) 0.5 MG tablet Take 1 tablet (0.5 mg total) by mouth 2 (two) times daily as needed for anxiety. Patient taking differently: Take 0.5 mg by mouth 2 (two) times daily.  04/16/15   Marjie Skiff, MD  LATUDA 40 MG TABS tablet Take 40 mg by mouth daily after supper.  02/06/17   [provider]  lisdexamfetamine (VYVANSE) 40 MG capsule Take 40 mg by mouth daily.     [provider]  lithium 300 MG tablet Take 300 mg by mouth 2 (two) times daily. 05/04/17   [provider]  loratadine (CLARITIN) 10 MG tablet One daily as needed for allergies 11/20/17   Carmon Ginsberg, PA  meloxicam (MOBIC) 7.5 MG tablet Take one to tablets daily for pain. 11/04/18   Trinna Post, PA-C  omeprazole (PRILOSEC) 20 MG capsule Take 1 capsule (20 mg total) by mouth daily. 02/20/17   Carmon Ginsberg, PA  SUMAtriptan (IMITREX) 50 MG tablet Take one tablet at onset of headache; may repeat in 2 hours if not better 03/26/18   Carmon Ginsberg, PA  traZODone (DESYREL) 150 MG tablet Take 150 mg by  mouth at bedtime.  04/16/16   [provider]  triamcinolone cream (KENALOG) 0.1 % Apply 1 application topically 2 (two) times daily. 11/04/18   [name] M, PA-C  TRINTELLIX 10 MG TABS Take 10 mg by mouth daily.  08/25/16   [provider]    Review of Systems  Constitutional: Negative.        Change in appetite  HENT: Negative.   Eyes: Negative.  Respiratory: Negative.   Cardiovascular: Negative.   Gastrointestinal: Negative.   Genitourinary: Positive for dysuria, frequency and urgency. Negative for flank pain and hematuria.       Vaginal discharge  Musculoskeletal: Negative.   Skin: Negative.   Neurological: Negative.   Psychiatric/Behavioral: Positive for depression. Negative for hallucinations, memory loss, substance abuse and suicidal ideas. The patient is nervous/anxious. The patient does not have insomnia.      Physical Exam BP 116/75 (BP Location: Left Arm, Patient Position: Sitting, Cuff Size: Large)   Pulse 67   Ht 5\' 7"  (1.702 m)   Wt 224 lb (101.6 kg)   LMP 10/28/2018   BMI 35.08 kg/m  Patient's last menstrual period was 10/28/2018. Physical Exam Constitutional:      General: She is not in acute distress.    Appearance: Normal appearance. She is well-developed.  Genitourinary:     Pelvic exam was performed with patient in the lithotomy position.     Vulva, urethra and bladder normal.     No inguinal adenopathy present in the right or left side.       No signs of injury in the vagina.     No vaginal discharge, erythema, tenderness or bleeding.     No cervical motion tenderness, discharge, lesion or polyp.     Cervical exam comments: Strawberry appearance.     Uterus is tender and mobile.     Uterus is not enlarged.     No uterine mass detected.    Uterus is anteverted.     No right or left adnexal mass present.     Right adnexa tender (Right > left).     Right adnexa not full.     Left adnexa tender (Right > Left).     Left adnexa  not full.  HENT:     Head: Normocephalic and atraumatic.  Eyes:     General: No scleral icterus.    Conjunctiva/sclera: Conjunctivae normal.  Neck:     Musculoskeletal: Normal range of motion and neck supple.     Thyroid: No thyromegaly.  Cardiovascular:     Rate and Rhythm: Normal rate and regular rhythm.     Heart sounds: No murmur. No friction rub. No gallop.   Pulmonary:     Effort: Pulmonary effort is normal. No respiratory distress.     Breath sounds: Normal breath sounds. No wheezing or rales.  Abdominal:     General: Bowel sounds are normal. There is no distension.     Palpations: Abdomen is soft. There is no mass.     Tenderness: There is abdominal tenderness (RLQ, some LLQ referring to RLQ). There is no guarding or rebound.  Musculoskeletal: Normal range of motion.        General: No swelling or tenderness.  Lymphadenopathy:     Cervical: No cervical adenopathy.     Lower Body: No right inguinal adenopathy. No left inguinal adenopathy.  Neurological:     General: No focal deficit present.     Mental Status: She is alert and oriented to person, place, and time.     Cranial Nerves: No cranial nerve deficit.  Skin:    General: Skin is warm and dry.     Findings: No erythema or rash.  Psychiatric:        Mood and Affect: Mood normal.        Behavior: Behavior normal.        Judgment: Judgment normal.     Female chaperone present  for pelvic and breast  portions of the physical exam  Assessment: 43 y.o. G3P0010 female here for  1. Right lower quadrant abdominal pain   2. Right ovarian cyst   3. Pap smear for cervical cancer screening   4. Screen for STD (sexually transmitted disease)      Plan: Problem List Items Addressed This Visit    None    Visit Diagnoses    Right lower quadrant abdominal pain    -  Primary   Relevant Orders   Cytology - PAP   US PELVIS TRANSVAGINAL NON-OB (TV ONLY)   Right ovarian cyst       Relevant Orders   US PELVIS TRANSVAGINAL  NON-OB (TV ONLY)   Pap smear for cervical cancer screening       Relevant Orders   Cytology - PAP   Screen for STD (sexually transmitted disease)       Relevant Orders   Cytology - PAP     Discussed that her ovarian cyst is fairly small.  However, a CT scan does not characterize this lesion very well.  I recommended a pelvic ultrasound to further characterize her lesion and plan any potential next steps.  She seemed agreeable to this plan.  I reviewed the report from [facility] that was the report from her CT scan indicating the right ovarian cyst.  Right lower quadrant abdominal pain: Etiology is unclear.  She states that she is being treated for UTI.  However, if this were the source, this should have improved her pain by now.  She did have trichomonas.  We discussed that there is a possibility this could be the source of her pain.  However, the possibility is low.  We did discuss the ramifications of a diagnosis of trichomonas.  I recommended that her partner be treated and that they wait a week prior to intercourse after his treatment.  Regardless of how the infection began, the cycle of passing the infection back and forth would continue until they are both treated and the infection is eradicated.  She voiced understanding of this and agreement to have her partner treated.  She was tested again today for test of cure.  A Pap smear was performed today and screening for STDs was also performed, per the patient request.  30 minutes spent in face to face discussion with > 50% spent in counseling,management, and coordination of care of her Right lower quadrant abdominal pain and right ovarian cyst.   Prentice Docker, MD 11/25/2018 5:49 PM     CC: Trinna Post, PA-C [address] [place],  [place] [address]

## 2018-11-30 ENCOUNTER — Encounter: Payer: Self-pay | Admitting: Obstetrics and Gynecology

## 2018-11-30 LAB — CYTOLOGY - PAP
Chlamydia: NEGATIVE
Diagnosis: NEGATIVE
High risk HPV: NEGATIVE
Molecular Disclaimer: 56
Molecular Disclaimer: DETECTED
Molecular Disclaimer: NEGATIVE
Molecular Disclaimer: NEGATIVE
Molecular Disclaimer: NORMAL
Neisseria Gonorrhea: NEGATIVE
Trichomonas: NEGATIVE

## 2018-12-02 ENCOUNTER — Encounter: Payer: Self-pay | Admitting: Obstetrics and Gynecology

## 2018-12-06 ENCOUNTER — Ambulatory Visit (INDEPENDENT_AMBULATORY_CARE_PROVIDER_SITE_OTHER): Payer: Medicaid Other

## 2018-12-06 ENCOUNTER — Encounter: Payer: Self-pay | Admitting: Obstetrics and Gynecology

## 2018-12-06 ENCOUNTER — Other Ambulatory Visit: Payer: Self-pay

## 2018-12-06 ENCOUNTER — Ambulatory Visit (INDEPENDENT_AMBULATORY_CARE_PROVIDER_SITE_OTHER): Payer: Medicaid Other | Admitting: Obstetrics and Gynecology

## 2018-12-06 VITALS — BP 122/78 | Ht 67.0 in | Wt 224.0 lb

## 2018-12-06 DIAGNOSIS — R102 Pelvic and perineal pain: Secondary | ICD-10-CM

## 2018-12-06 DIAGNOSIS — R1031 Right lower quadrant pain: Secondary | ICD-10-CM

## 2018-12-06 DIAGNOSIS — N83201 Unspecified ovarian cyst, right side: Secondary | ICD-10-CM

## 2018-12-06 NOTE — Progress Notes (Signed)
Gynecology Ultrasound Follow Up   Chief Complaint  Patient presents with  . Follow-up  . Pelvic Pain  Ultrasound follow up for pelvic pain and right ovarian cyst   History of Present Illness: Patient is a 43 y.o. female who presents today for ultrasound evaluation of the above .  Ultrasound demonstrates the following findings Adnexa: no masses seen  Uterus: anteverted with endometrial stripe  1.4 mm Additional: it appears the cyst on the right ovary has resolved.   Past Medical History:  Diagnosis Date  . ADHD (attention deficit hyperactivity disorder)   . Anxiety   . Arthritis   . Asthma   . Bipolar disorder ([place])   . Depression   . GERD (gastroesophageal reflux disease)   . Headache    migraines  . Sleep apnea    cpap    Past Surgical History:  Procedure Laterality Date  . BACK SURGERY  03/2017   cervical fusion  . CHOLECYSTECTOMY    . GALLBLADDER SURGERY    . PLANTAR FASCIA RELEASE Left 06/12/2017   Procedure: ENDOSCOPIC PLANTAR FASCIOTOMY-RELEASE;  Surgeon: Samara Deist, DPM;  Location: [facility] ORS;  Service: Podiatry;  Laterality: Left;  . REPAIR EXTENSOR TENDON Left 06/12/2017   Procedure: REPAIR FLEXOR TENDON;  Surgeon: Samara Deist, DPM;  Location: [facility] ORS;  Service: Podiatry;  Laterality: Left;  . TARSAL TUNNEL RELEASE Left 06/12/2017   Procedure: TARSAL TUNNEL RELEASE-BAXTER RELEASE;  Surgeon: Samara Deist, DPM;  Location: [facility] ORS;  Service: Podiatry;  Laterality: Left;  . TUBAL LIGATION      Family History  Problem Relation Age of Onset  . Hypercholesterolemia Father   . Bipolar disorder Mother   . Hypercholesterolemia Maternal Grandmother   . Hypertension Maternal Grandmother   . Anxiety disorder Maternal Grandmother   . Depression Maternal Grandmother   . Ovarian cancer Maternal Grandmother   . Colon cancer Maternal Grandfather 53  . Ovarian cancer Paternal Grandmother     Social History   Socioeconomic History  . Marital status: Single     Spouse name: Not on file  . Number of children: Not on file  . Years of education: Not on file  . Highest education level: Not on file  Occupational History  . Not on file  Social Needs  . Financial resource strain: Not on file  . Food insecurity    Worry: Not on file    Inability: Not on file  . Transportation needs    Medical: Not on file    Non-medical: Not on file  Tobacco Use  . Smoking status: Current Every Day Smoker    Packs/day: 2.00    Years: 15.00    Pack years: 30.00    Types: Cigarettes    Start date: 09/25/1994  . Smokeless tobacco: Never Used  Substance and Sexual Activity  . Alcohol use: No    Alcohol/week: 0.0 standard drinks  . Drug use: No  . Sexual activity: Yes    Birth control/protection: None  Lifestyle  . Physical activity    Days per week: Not on file    Minutes per session: Not on file  . Stress: Not on file  Relationships  . Social Herbalist on phone: Not on file    Gets together: Not on file    Attends religious service: Not on file    Active member of club or organization: Not on file    Attends meetings of clubs or organizations: Not on file  Relationship status: Not on file  . Intimate partner violence    Fear of current or ex partner: Not on file    Emotionally abused: Not on file    Physically abused: Not on file    Forced sexual activity: Not on file  Other Topics Concern  . Not on file  Social History Narrative  . Not on file    Allergies  Allergen Reactions  . Codeine Hives and Shortness Of Breath  . Azithromycin Diarrhea  . Sulfa Antibiotics Other (See Comments)    Severe abdominal pain.    Prior to Admission medications   Medication Sig Start Date End Date Taking? Authorizing Provider  albuterol (PROVENTIL HFA;VENTOLIN HFA) 108 (90 Base) MCG/ACT inhaler Inhale 2 puffs into the lungs every 6 (six) hours as needed for wheezing or shortness of breath. 11/20/17   Carmon Ginsberg, PA   amphetamine-dextroamphetamine (ADDERALL) 10 MG tablet Take 10 mg by mouth daily at 2 PM. 05/04/17   [provider]  buPROPion (WELLBUTRIN XL) 300 MG 24 hr tablet Take 300 mg by mouth daily.  04/16/16   [provider]  clonazePAM (KLONOPIN) 0.5 MG tablet Take 1 tablet (0.5 mg total) by mouth 2 (two) times daily as needed for anxiety. Patient taking differently: Take 0.5 mg by mouth 2 (two) times daily.  04/16/15   Marjie Skiff, MD  dicyclomine (BENTYL) 20 MG tablet Take by mouth. 10/28/18 11/07/18  [provider]  ibuprofen (ADVIL,MOTRIN) 200 MG tablet Take 800 mg by mouth every 8 (eight) hours as needed (for pain.).    [provider]  LATUDA 40 MG TABS tablet Take 40 mg by mouth daily after supper.  02/06/17   [provider]  lisdexamfetamine (VYVANSE) 40 MG capsule Take 40 mg by mouth daily.     [provider]  lithium 300 MG tablet Take 300 mg by mouth 2 (two) times daily. 05/04/17   [provider]  loratadine (CLARITIN) 10 MG tablet One daily as needed for allergies 11/20/17   Carmon Ginsberg, PA  meloxicam (MOBIC) 7.5 MG tablet Take one to tablets daily for pain. 11/04/18   Trinna Post, PA-C  metroNIDAZOLE (FLAGYL) 500 MG tablet Take 1 tablet (500 mg total) by mouth 2 (two) times daily. 11/14/18   Marylene Land, NP  omeprazole (PRILOSEC) 20 MG capsule Take 1 capsule (20 mg total) by mouth daily. 02/20/17   Carmon Ginsberg, PA  SUMAtriptan (IMITREX) 50 MG tablet Take one tablet at onset of headache; may repeat in 2 hours if not better 03/26/18   Carmon Ginsberg, PA  traZODone (DESYREL) 150 MG tablet Take 150 mg by mouth at bedtime.  04/16/16   [provider]  triamcinolone cream (KENALOG) 0.1 % Apply 1 application topically 2 (two) times daily. 11/04/18   Trinna Post, PA-C  trimethoprim-polymyxin b (POLYTRIM) ophthalmic solution 1-2 drops every 4-6 hours (max 6 drops/day) Patient not taking: Reported on 11/04/2018  03/26/18   Carmon Ginsberg, PA  TRINTELLIX 10 MG TABS Take 10 mg by mouth daily.  08/25/16   [provider]    Physical Exam BP 122/78   Ht 5\' 7"  (1.702 m)   Wt 224 lb (101.6 kg)   LMP 11/09/2018 (Approximate)   BMI 35.08 kg/m    General: NAD HEENT: normocephalic, anicteric Pulmonary: No increased work of breathing Extremities: no edema, erythema, or tenderness Neurologic: Grossly intact, normal gait Psychiatric: mood appropriate, affect full  Imaging Results US Pelvis Transvaginal Non-ob (tv  Only)  Result Date: 12/06/2018 Patient Name: Ann Morales DOB: [birth year] MRN: [id number] ULTRASOUND REPORT Location: [facility] Date of Service: 12/06/2018 Indications:Pelvic Pain Findings: The uterus is anteverted and measures 7.5 x 4.9 x 4.3 cm. Echo texture is homogenous without evidence of focal masses. The Endometrium measures 1.4 mm. Right Ovary measures 2.0 x 1.4 x 0.9 cm. It is normal in appearance. Left Ovary measures 1.9 x 1.7 x 1.2 cm. It is normal in appearance. Survey of the adnexa demonstrates no adnexal masses. There is no free fluid in the cul de sac. Impression: 1. Normal pelvic ultrasound. Gweneth Dimitri, RT The ultrasound images and findings were reviewed by me and I agree with the above report. Prentice Docker, MD, [facility], [facility] 12/06/2018 3:17 PM     Assessment: 43 y.o. G3P0010  1. Right lower quadrant abdominal pain   2. Pelvic pain in female      Plan: Problem List Items Addressed This Visit    None    Visit Diagnoses    Right lower quadrant abdominal pain    -  Primary   Pelvic pain in female         Ultrasound results and other results reviewed with patient.  Normal Pap smear and negative screening for STDs.  Discussed other sources for pelvic pain, including; gastrointestinal, urologic, musculoskeletal, psychiatric, and gynecologic.  She states that she wants a definitive answer.  There are no indications as to  another pelvic organ system.  Discussed waiting for the pain to resolve with conservative measures versus an diagnostic laparoscopy to make assessment of her pelvis.  We discussed the risk of surgery and the possibility of negative findings on the surgery and the costs associated for possible benefits.  She adamantly desires a diagnostic laparoscopy.  I believe overall that this is reasonable given her level of discomfort.  We will schedule for soon.  15 minutes spent in face to face discussion with > 50% spent in counseling,management, and coordination of care of her pelvic pain in female and right lower quadrant abdominal pain.   Prentice Docker, MD, [facility], [facility] 12/06/2018 3:32 PM

## 2018-12-09 ENCOUNTER — Telehealth: Payer: Self-pay | Admitting: Obstetrics and Gynecology

## 2018-12-09 NOTE — Telephone Encounter (Signed)
-----   Message from Will Bonnet, MD sent at 12/06/2018  3:28 PM EDT ----- Regarding: Schedule surgery Surgery Booking Request Patient Full Name:  Ann Morales  MRN: [id number]  DOB: 11-Mar-1975  Surgeon: Prentice Docker, MD  Requested Surgery Date and Time: TBD Primary Diagnosis AND Code:  1) Pelvic pain in female (R10.2) 2) Right lower quadrant abdominal pain (R10.31)  Secondary Diagnosis and Code:  Surgical Procedure: diagnostic laparoscopy L&D Notification: No Admission Status: same day surgery Length of Surgery: 45 minutes Special Case Needs: none H&P: TBD (date) Phone Interview???: no Interpreter: Language:  Medical Clearance: no Special Scheduling Instructions: none Acuity: P3

## 2018-12-09 NOTE — Telephone Encounter (Signed)
Patient is aware of H&P on 12/13/18 @ 4:10pm w/ Dr. Glennon Mac, Pre-admit testing to be scheduled, COVID testing on 12/17/18, and OR on 12/21/18. Patient is aware she will be asked to quarantine after COVID testing. Patient is aware she may receive calls from the [facility]. Patient confirmed Medicaid.

## 2018-12-13 ENCOUNTER — Encounter: Payer: Self-pay | Admitting: Obstetrics and Gynecology

## 2018-12-13 ENCOUNTER — Ambulatory Visit (INDEPENDENT_AMBULATORY_CARE_PROVIDER_SITE_OTHER): Payer: Medicaid Other | Admitting: Obstetrics and Gynecology

## 2018-12-13 ENCOUNTER — Other Ambulatory Visit: Payer: Self-pay

## 2018-12-13 VITALS — BP 128/74 | Ht 67.0 in | Wt 217.0 lb

## 2018-12-13 DIAGNOSIS — R1031 Right lower quadrant pain: Secondary | ICD-10-CM | POA: Diagnosis not present

## 2018-12-13 DIAGNOSIS — R102 Pelvic and perineal pain: Secondary | ICD-10-CM

## 2018-12-13 NOTE — H&P (View-Only) (Signed)
Preoperative History and Physical  Ann Morales is a 43 y.o. G3P0010 here for surgical management of chronic pelvic pain (right lower quadrant pelvic pain).   No significant preoperative concerns.  History of Present Illness: 43 y.o. G48P0010 female who presents in referral from Ann Morales, [place], from Ann Morales for a right ovarian cyst noted on CT scan on 10/28/2018 at [facility]. The cysts measured 1.3 cm on the right ovary.  She has pain on her right side and it has been present for one month, constantly.  She describes the pain as stabbing and twisting.  The pain radiates into her groin a little bit. She rates the pain at times as a 10/10, right now an 8/10. It never goes below a 7/10.  Alleviating factors: nothing. Sometimes she lies down and it gets slightly better.  Aggravating factors: Nothing.  She is being treated for a UTI and trichomonas.   Associated symptoms: nausea without emesis.  She specifically denies fevers, chills, hematochezia, hematuria.  She has never had this type of pain before.   Her last pap smear was 2-3 years ago.  She remembers it being normal.    She had a follow up ultrasound on 9/28 that showed resolution of her ovarian cyst and an endometrial stripe of 1.4 mm.     She had a normal pap smear with negative HPV on 9/17 and negative STD screening the same day.   Proposed surgery: Diagnostic laparoscopy  Past Medical History:  Diagnosis Date  . ADHD (attention deficit hyperactivity disorder)   . Anxiety   . Arthritis   . Asthma   . Bipolar disorder ([place])   . Depression   . GERD (gastroesophageal reflux disease)   . Headache    migraines  . Sleep apnea    cpap   Past Surgical History:  Procedure Laterality Date  . BACK SURGERY  03/2017   cervical fusion  . CHOLECYSTECTOMY    . GALLBLADDER SURGERY    . PLANTAR FASCIA RELEASE Left 06/12/2017   Procedure: ENDOSCOPIC PLANTAR FASCIOTOMY-RELEASE;  Surgeon: Ann Morales, DPM;  Location: [facility]  ORS;  Service: Podiatry;  Laterality: Left;  . REPAIR EXTENSOR TENDON Left 06/12/2017   Procedure: REPAIR FLEXOR TENDON;  Surgeon: Ann Morales, DPM;  Location: [facility] ORS;  Service: Podiatry;  Laterality: Left;  . TARSAL TUNNEL RELEASE Left 06/12/2017   Procedure: TARSAL TUNNEL RELEASE-BAXTER RELEASE;  Surgeon: Ann Morales, DPM;  Location: [facility] ORS;  Service: Podiatry;  Laterality: Left;  . TUBAL LIGATION     OB History  Gravida Para Term Preterm AB Living  3 2     1     SAB TAB Ectopic Multiple Live Births  1            # Outcome Date GA Lbr Len/2nd Weight Sex Delivery Anes PTL Lv  3 SAB           2 Para           1 Para           Patient denies any other pertinent gynecologic issues.   Current Outpatient Medications on File Prior to Visit  Medication Sig Dispense Refill  . albuterol (PROVENTIL HFA;VENTOLIN HFA) 108 (90 Base) MCG/ACT inhaler Inhale 2 puffs into the lungs every 6 (six) hours as needed for wheezing or shortness of breath. 1 Inhaler 5  . amphetamine-dextroamphetamine (ADDERALL) 10 MG tablet Take 10 mg by mouth daily at 2 PM.  0  . buPROPion (WELLBUTRIN XL) 300  MG 24 hr tablet Take 300 mg by mouth daily.   1  . clonazePAM (KLONOPIN) 0.5 MG tablet Take 1 tablet (0.5 mg total) by mouth 2 (two) times daily as needed for anxiety. (Patient taking differently: Take 0.5 mg by mouth 2 (two) times daily. ) 60 tablet 3  . ibuprofen (ADVIL) 800 MG tablet Take 800 mg by mouth every 8 (eight) hours as needed for moderate pain.     [name] Kitchen LATUDA 40 MG TABS tablet Take 40 mg by mouth daily after supper.   2  . lisdexamfetamine (VYVANSE) 40 MG capsule Take 40 mg by mouth daily.     [name] Kitchen lithium 300 MG tablet Take 300 mg by mouth 2 (two) times daily.  2  . loratadine (CLARITIN) 10 MG tablet One daily as needed for allergies (Patient taking differently: Take 10 mg by mouth daily as needed for allergies. ) 30 tablet 5  . meloxicam (MOBIC) 7.5 MG tablet Take one to tablets daily for pain. (Patient  taking differently: Take 7.5 mg by mouth daily. ) 30 tablet 0  . metroNIDAZOLE (FLAGYL) 500 MG tablet Take 1 tablet (500 mg total) by mouth 2 (two) times daily. (Patient not taking: Reported on 12/13/2018) 14 tablet 0  . omeprazole (PRILOSEC) 20 MG capsule Take 1 capsule (20 mg total) by mouth daily. 90 capsule 1  . SUMAtriptan (IMITREX) 50 MG tablet Take one tablet at onset of headache; may repeat in 2 hours if not better (Patient taking differently: Take 50 mg by mouth every 2 (two) hours as needed for migraine. ) 10 tablet 2  . traZODone (DESYREL) 150 MG tablet Take 150 mg by mouth at bedtime.   1  . triamcinolone cream (KENALOG) 0.1 % Apply 1 application topically 2 (two) times daily. (Patient taking differently: Apply 1 application topically 2 (two) times daily as needed (rash). ) 30 g 0  . trimethoprim-polymyxin b (POLYTRIM) ophthalmic solution 1-2 drops every 4-6 hours (max 6 drops/day) (Patient not taking: Reported on 11/04/2018) 10 mL 0  . TRINTELLIX 10 MG TABS Take 10 mg by mouth daily.   2   No current facility-administered medications on file prior to visit.    Allergies  Allergen Reactions  . Codeine Hives and Shortness Of Breath  . Azithromycin Diarrhea  . Sulfa Antibiotics Other (See Comments)    Severe abdominal pain.    Social History:   reports that she has been smoking cigarettes. She started smoking about 24 years ago. She has a 30.00 pack-year smoking history. She has never used smokeless tobacco. She reports that she does not drink alcohol or use drugs.  Family History  Problem Relation Age of Onset  . Hypercholesterolemia Father   . Bipolar disorder Mother   . Hypercholesterolemia Maternal Grandmother   . Hypertension Maternal Grandmother   . Anxiety disorder Maternal Grandmother   . Depression Maternal Grandmother   . Ovarian cancer Maternal Grandmother   . Colon cancer Maternal Grandfather 34  . Ovarian cancer Paternal Grandmother     Review of Systems:  Noncontributory  PHYSICAL EXAM: Blood pressure 128/74, height 5\' 7"  (1.702 m), weight 217 lb (98.4 kg). CONSTITUTIONAL: Well-developed, well-nourished female in no acute distress.  HENT:  Normocephalic, atraumatic, External right and left ear normal. Oropharynx is clear and moist EYES: Conjunctivae and EOM are normal. Pupils are equal, round, and reactive to light. No scleral icterus.  NECK: Normal range of motion, supple, no masses SKIN: Skin is warm and dry. No rash noted.  Not diaphoretic. No erythema. No pallor. [place]: Alert and oriented to person, place, and time. Normal reflexes, muscle tone coordination. No cranial nerve deficit noted. PSYCHIATRIC: Normal mood and affect. Normal behavior. Normal judgment and thought content. CARDIOVASCULAR: Normal heart rate noted, regular rhythm RESPIRATORY: Effort and breath sounds normal, no problems with respiration noted ABDOMEN: Soft, nontender, nondistended. PELVIC: Deferred MUSCULOSKELETAL: Normal range of motion. No edema and no tenderness. 2+ distal pulses.  Labs: No results found for this or any previous visit (from the past 336 hour(s)).  Imaging Studies: US Pelvis Transvaginal Non-ob (tv Only)  Result Date: 12/06/2018 Patient Name: Barabara Stayton DOB: 1975/11/29 MRN: [id number] ULTRASOUND REPORT Location: [facility] Date of Service: 12/06/2018 Indications:Pelvic Pain Findings: The uterus is anteverted and measures 7.5 x 4.9 x 4.3 cm. Echo texture is homogenous without evidence of focal masses. The Endometrium measures 1.4 mm. Right Ovary measures 2.0 x 1.4 x 0.9 cm. It is normal in appearance. Left Ovary measures 1.9 x 1.7 x 1.2 cm. It is normal in appearance. Survey of the adnexa demonstrates no adnexal masses. There is no free fluid in the cul de sac. Impression: 1. Normal pelvic ultrasound. Gweneth Dimitri, RT The ultrasound images and findings were reviewed by me and I agree with the above report. Prentice Docker, MD, [facility], [facility] 12/06/2018 3:17 PM     Assessment:   ICD-10-CM   1. Pelvic pain in female  R10.2   2. Right lower quadrant abdominal pain  R10.31      Plan: Patient will undergo surgical management with the above surgery.   The risks of surgery were discussed in detail with the patient including but not limited to: bleeding which may require transfusion or reoperation; infection which may require antibiotics; injury to surrounding organs which may involve bowel, bladder, ureters ; need for additional procedures including laparoscopy or laparotomy; thromboembolic phenomenon, surgical site problems and other postoperative/anesthesia complications. Likelihood of success in alleviating the patient's condition was discussed. Routine postoperative instructions will be reviewed with the patient and her family in detail after surgery.  The patient concurred with the proposed plan, giving informed written consent for the surgery.  Preoperative prophylactic antibiotics, as indicated, and SCDs ordered on call to the OR.    Prentice Docker, MD 12/13/2018 4:15 PM

## 2018-12-13 NOTE — Progress Notes (Signed)
Preoperative History and Physical  Ann Morales is a 43 y.o. G3P0010 here for surgical management of chronic pelvic pain (right lower quadrant pelvic pain).   No significant preoperative concerns.  History of Present Illness: 43 y.o. G15P0010 female who presents in referral from [name], [place], from [facility] for a right ovarian cyst noted on CT scan on 10/28/2018 at [facility]. The cysts measured 1.3 cm on the right ovary.  She has pain on her right side and it has been present for one month, constantly.  She describes the pain as stabbing and twisting.  The pain radiates into her groin a little bit. She rates the pain at times as a 10/10, right now an 8/10. It never goes below a 7/10.  Alleviating factors: nothing. Sometimes she lies down and it gets slightly better.  Aggravating factors: Nothing.  She is being treated for a UTI and trichomonas.   Associated symptoms: nausea without emesis.  She specifically denies fevers, chills, hematochezia, hematuria.  She has never had this type of pain before.   Her last pap smear was 2-3 years ago.  She remembers it being normal.    She had a follow up ultrasound on 9/28 that showed resolution of her ovarian cyst and an endometrial stripe of 1.4 mm.     She had a normal pap smear with negative HPV on 9/17 and negative STD screening the same day.   Proposed surgery: Diagnostic laparoscopy  Past Medical History:  Diagnosis Date  . ADHD (attention deficit hyperactivity disorder)   . Anxiety   . Arthritis   . Asthma   . Bipolar disorder ([place])   . Depression   . GERD (gastroesophageal reflux disease)   . Headache    migraines  . Sleep apnea    cpap   Past Surgical History:  Procedure Laterality Date  . BACK SURGERY  03/2017   cervical fusion  . CHOLECYSTECTOMY    . GALLBLADDER SURGERY    . PLANTAR FASCIA RELEASE Left 06/12/2017   Procedure: ENDOSCOPIC PLANTAR FASCIOTOMY-RELEASE;  Surgeon: Samara Deist, DPM;  Location: [facility]  ORS;  Service: Podiatry;  Laterality: Left;  . REPAIR EXTENSOR TENDON Left 06/12/2017   Procedure: REPAIR FLEXOR TENDON;  Surgeon: Samara Deist, DPM;  Location: [facility] ORS;  Service: Podiatry;  Laterality: Left;  . TARSAL TUNNEL RELEASE Left 06/12/2017   Procedure: TARSAL TUNNEL RELEASE-BAXTER RELEASE;  Surgeon: Samara Deist, DPM;  Location: [facility] ORS;  Service: Podiatry;  Laterality: Left;  . TUBAL LIGATION     OB History  Gravida Para Term Preterm AB Living  3 2     1     SAB TAB Ectopic Multiple Live Births  1            # Outcome Date GA Lbr Len/2nd Weight Sex Delivery Anes PTL Lv  3 SAB           2 Para           1 Para           Patient denies any other pertinent gynecologic issues.   Current Outpatient Medications on File Prior to Visit  Medication Sig Dispense Refill  . albuterol (PROVENTIL HFA;VENTOLIN HFA) 108 (90 Base) MCG/ACT inhaler Inhale 2 puffs into the lungs every 6 (six) hours as needed for wheezing or shortness of breath. 1 Inhaler 5  . amphetamine-dextroamphetamine (ADDERALL) 10 MG tablet Take 10 mg by mouth daily at 2 PM.  0  . buPROPion (WELLBUTRIN XL) 300  MG 24 hr tablet Take 300 mg by mouth daily.   1  . clonazePAM (KLONOPIN) 0.5 MG tablet Take 1 tablet (0.5 mg total) by mouth 2 (two) times daily as needed for anxiety. (Patient taking differently: Take 0.5 mg by mouth 2 (two) times daily. ) 60 tablet 3  . ibuprofen (ADVIL) 800 MG tablet Take 800 mg by mouth every 8 (eight) hours as needed for moderate pain.     [name] Kitchen LATUDA 40 MG TABS tablet Take 40 mg by mouth daily after supper.   2  . lisdexamfetamine (VYVANSE) 40 MG capsule Take 40 mg by mouth daily.     [name] Kitchen lithium 300 MG tablet Take 300 mg by mouth 2 (two) times daily.  2  . loratadine (CLARITIN) 10 MG tablet One daily as needed for allergies (Patient taking differently: Take 10 mg by mouth daily as needed for allergies. ) 30 tablet 5  . meloxicam (MOBIC) 7.5 MG tablet Take one to tablets daily for pain. (Patient  taking differently: Take 7.5 mg by mouth daily. ) 30 tablet 0  . metroNIDAZOLE (FLAGYL) 500 MG tablet Take 1 tablet (500 mg total) by mouth 2 (two) times daily. (Patient not taking: Reported on 12/13/2018) 14 tablet 0  . omeprazole (PRILOSEC) 20 MG capsule Take 1 capsule (20 mg total) by mouth daily. 90 capsule 1  . SUMAtriptan (IMITREX) 50 MG tablet Take one tablet at onset of headache; may repeat in 2 hours if not better (Patient taking differently: Take 50 mg by mouth every 2 (two) hours as needed for migraine. ) 10 tablet 2  . traZODone (DESYREL) 150 MG tablet Take 150 mg by mouth at bedtime.   1  . triamcinolone cream (KENALOG) 0.1 % Apply 1 application topically 2 (two) times daily. (Patient taking differently: Apply 1 application topically 2 (two) times daily as needed (rash). ) 30 g 0  . trimethoprim-polymyxin b (POLYTRIM) ophthalmic solution 1-2 drops every 4-6 hours (max 6 drops/day) (Patient not taking: Reported on 11/04/2018) 10 mL 0  . TRINTELLIX 10 MG TABS Take 10 mg by mouth daily.   2   No current facility-administered medications on file prior to visit.    Allergies  Allergen Reactions  . Codeine Hives and Shortness Of Breath  . Azithromycin Diarrhea  . Sulfa Antibiotics Other (See Comments)    Severe abdominal pain.    Social History:   reports that she has been smoking cigarettes. She started smoking about 24 years ago. She has a 30.00 pack-year smoking history. She has never used smokeless tobacco. She reports that she does not drink alcohol or use drugs.  Family History  Problem Relation Age of Onset  . Hypercholesterolemia Father   . Bipolar disorder Mother   . Hypercholesterolemia Maternal Grandmother   . Hypertension Maternal Grandmother   . Anxiety disorder Maternal Grandmother   . Depression Maternal Grandmother   . Ovarian cancer Maternal Grandmother   . Colon cancer Maternal Grandfather 29  . Ovarian cancer Paternal Grandmother     Review of Systems:  Noncontributory  PHYSICAL EXAM: Blood pressure 128/74, height 5\' 7"  (1.702 m), weight 217 lb (98.4 kg). CONSTITUTIONAL: Well-developed, well-nourished female in no acute distress.  HENT:  Normocephalic, atraumatic, External right and left ear normal. Oropharynx is clear and moist EYES: Conjunctivae and EOM are normal. Pupils are equal, round, and reactive to light. No scleral icterus.  NECK: Normal range of motion, supple, no masses SKIN: Skin is warm and dry. No rash noted.  Not diaphoretic. No erythema. No pallor. [place]: Alert and oriented to person, place, and time. Normal reflexes, muscle tone coordination. No cranial nerve deficit noted. PSYCHIATRIC: Normal mood and affect. Normal behavior. Normal judgment and thought content. CARDIOVASCULAR: Normal heart rate noted, regular rhythm RESPIRATORY: Effort and breath sounds normal, no problems with respiration noted ABDOMEN: Soft, nontender, nondistended. PELVIC: Deferred MUSCULOSKELETAL: Normal range of motion. No edema and no tenderness. 2+ distal pulses.  Labs: No results found for this or any previous visit (from the past 336 hour(s)).  Imaging Studies: US Pelvis Transvaginal Non-ob (tv Only)  Result Date: 12/06/2018 Patient Name: Brendalynn Danesi DOB: 1975/11/02 MRN: [id number] ULTRASOUND REPORT Location: [facility] Date of Service: 12/06/2018 Indications:Pelvic Pain Findings: The uterus is anteverted and measures 7.5 x 4.9 x 4.3 cm. Echo texture is homogenous without evidence of focal masses. The Endometrium measures 1.4 mm. Right Ovary measures 2.0 x 1.4 x 0.9 cm. It is normal in appearance. Left Ovary measures 1.9 x 1.7 x 1.2 cm. It is normal in appearance. Survey of the adnexa demonstrates no adnexal masses. There is no free fluid in the cul de sac. Impression: 1. Normal pelvic ultrasound. Gweneth Dimitri, RT The ultrasound images and findings were reviewed by me and I agree with the above report. Prentice Docker, MD, [facility], [facility] 12/06/2018 3:17 PM     Assessment:   ICD-10-CM   1. Pelvic pain in female  R10.2   2. Right lower quadrant abdominal pain  R10.31      Plan: Patient will undergo surgical management with the above surgery.   The risks of surgery were discussed in detail with the patient including but not limited to: bleeding which may require transfusion or reoperation; infection which may require antibiotics; injury to surrounding organs which may involve bowel, bladder, ureters ; need for additional procedures including laparoscopy or laparotomy; thromboembolic phenomenon, surgical site problems and other postoperative/anesthesia complications. Likelihood of success in alleviating the patient's condition was discussed. Routine postoperative instructions will be reviewed with the patient and her family in detail after surgery.  The patient concurred with the proposed plan, giving informed written consent for the surgery.  Preoperative prophylactic antibiotics, as indicated, and SCDs ordered on call to the OR.    Prentice Docker, MD 12/13/2018 4:15 PM

## 2018-12-17 ENCOUNTER — Encounter
Admission: RE | Admit: 2018-12-17 | Discharge: 2018-12-17 | Disposition: A | Discharge status: Home / Self Care | Payer: Medicaid Other | Source: Ambulatory Visit | Attending: Obstetrics and Gynecology | Admitting: Obstetrics and Gynecology

## 2018-12-17 ENCOUNTER — Other Ambulatory Visit: Payer: Self-pay

## 2018-12-17 DIAGNOSIS — R102 Pelvic and perineal pain: Secondary | ICD-10-CM | POA: Insufficient documentation

## 2018-12-17 DIAGNOSIS — R1031 Right lower quadrant pain: Secondary | ICD-10-CM | POA: Diagnosis not present

## 2018-12-17 DIAGNOSIS — Z01812 Encounter for preprocedural laboratory examination: Secondary | ICD-10-CM | POA: Insufficient documentation

## 2018-12-17 DIAGNOSIS — Z20828 Contact with and (suspected) exposure to other viral communicable diseases: Secondary | ICD-10-CM | POA: Diagnosis not present

## 2018-12-17 HISTORY — DX: Personal history of urinary calculi: Z87.442

## 2018-12-17 LAB — TYPE AND SCREEN
ABO/RH(D): B POS
Antibody Screen: NEGATIVE

## 2018-12-17 NOTE — Patient Instructions (Signed)
Your procedure is scheduled on: Tuesday 12/21/18.  Report to DAY SURGERY DEPARTMENT LOCATED ON 2ND FLOOR MEDICAL MALL ENTRANCE. To find out your arrival time please call [phone] between 1PM - 3PM on Monday 12/20/18.   Remember: Instructions that are not followed completely may result in serious medical risk, up to and including death, or upon the discretion of your surgeon and anesthesiologist your surgery may need to be rescheduled.      _X__ 1. Do not eat food after midnight the night before your procedure.                 No gum chewing or hard candies. You may drink clear liquids up to 2 hours                 before you are scheduled to arrive for your surgery- DO NOT drink clear                 liquids within 2 hours of the start of your surgery.                 Clear Liquids include:  water, apple juice without pulp, clear carbohydrate                 drink such as Clearfast or Gatorade, Black Coffee or Tea (Do not add                 milk or creamer to coffee or tea).    __X__2.  On the morning of surgery brush your teeth with toothpaste and water, you may rinse your mouth with mouthwash if you wish.  Do not swallow any toothpaste or mouthwash.       _X__ 3.  No Alcohol for 24 hours before or after surgery.     _X__ 4.  Do Not Smoke or use e-cigarettes For 24 Hours Prior to Your Surgery.                 Do not use any chewable tobacco products for at least 6 hours prior to                 surgery.    __X__5.  Notify your doctor if there is any change in your medical condition      (cold, fever, infections).       Do not wear jewelry, make-up, hairpins, clips or nail polish. Do not wear lotions, powders, or perfumes.  Do not shave 48 hours prior to surgery. Men may shave face and neck. Do not bring valuables to the hospital.     [facility] is not responsible for any belongings or valuables.   Contacts, dentures/partials or body piercings may not be worn  into surgery. Bring a case for your contacts, glasses or hearing aids, a denture cup will be supplied.     Patients discharged the day of surgery will not be allowed to drive home.    Please read over the following fact sheets that you were given:   MRSA Information   __X__ Take these medicines the morning of surgery with A SIP OF WATER:     1. albuterol (PROVENTIL HFA;VENTOLIN HFA) 108 (90 Base) MCG/ACT inhaler  2. buPROPion (WELLBUTRIN XL) 300 MG 24 hr tablet  3. clonazePAM (KLONOPIN) 0.5 MG tablet i  4. lithium 300 MG tablet  5. omeprazole (PRILOSEC) 20 MG capsule  6. TRINTELLIX 10 MG TABS  7. SUMAtriptan (IMITREX) 50 MG tablet if needed  8. loratadine (CLARITIN) 10 MG tablet if needed   __X__ Use CHG Soap as directed   _ X___ Use inhalers on the day of surgery. Also bring the inhaler with you to the hospital on the morning of surgery.   __X__ Stop Anti-inflammatories 7 days before surgery such as Advil, Ibuprofen, Motrin, BC or Goodies Powder, Naprosyn, Naproxen, Aleve, Aspirin, Meloxicam. May take Tylenol if needed for pain or discomfort.    __X__ Don't start taking any new herbal supplements before your procedure.

## 2018-12-18 LAB — SARS CORONAVIRUS 2 (TAT 6-24 HRS): SARS Coronavirus 2: NEGATIVE

## 2018-12-21 ENCOUNTER — Encounter
Admission: RE | Disposition: A | Discharge status: Home / Self Care | Payer: Self-pay | Source: Ambulatory Visit | Attending: Obstetrics and Gynecology

## 2018-12-21 ENCOUNTER — Encounter: Payer: Self-pay | Admitting: Certified Registered Nurse Anesthetist

## 2018-12-21 ENCOUNTER — Ambulatory Visit: Payer: Medicaid Other | Admitting: Certified Registered Nurse Anesthetist

## 2018-12-21 ENCOUNTER — Ambulatory Visit
Admission: RE | Admit: 2018-12-21 | Discharge: 2018-12-21 | Disposition: A | Discharge status: Home / Self Care | Payer: Medicaid Other | Source: Ambulatory Visit | Attending: Obstetrics and Gynecology | Admitting: Obstetrics and Gynecology

## 2018-12-21 DIAGNOSIS — F329 Major depressive disorder, single episode, unspecified: Secondary | ICD-10-CM | POA: Diagnosis not present

## 2018-12-21 DIAGNOSIS — F909 Attention-deficit hyperactivity disorder, unspecified type: Secondary | ICD-10-CM | POA: Insufficient documentation

## 2018-12-21 DIAGNOSIS — Z791 Long term (current) use of non-steroidal anti-inflammatories (NSAID): Secondary | ICD-10-CM | POA: Diagnosis not present

## 2018-12-21 DIAGNOSIS — G4730 Sleep apnea, unspecified: Secondary | ICD-10-CM | POA: Insufficient documentation

## 2018-12-21 DIAGNOSIS — F419 Anxiety disorder, unspecified: Secondary | ICD-10-CM | POA: Diagnosis not present

## 2018-12-21 DIAGNOSIS — N838 Other noninflammatory disorders of ovary, fallopian tube and broad ligament: Secondary | ICD-10-CM | POA: Insufficient documentation

## 2018-12-21 DIAGNOSIS — R102 Pelvic and perineal pain unspecified side: Secondary | ICD-10-CM | POA: Diagnosis present

## 2018-12-21 DIAGNOSIS — F17210 Nicotine dependence, cigarettes, uncomplicated: Secondary | ICD-10-CM | POA: Insufficient documentation

## 2018-12-21 DIAGNOSIS — I739 Peripheral vascular disease, unspecified: Secondary | ICD-10-CM | POA: Insufficient documentation

## 2018-12-21 DIAGNOSIS — R1031 Right lower quadrant pain: Secondary | ICD-10-CM | POA: Diagnosis present

## 2018-12-21 DIAGNOSIS — G8929 Other chronic pain: Secondary | ICD-10-CM | POA: Diagnosis not present

## 2018-12-21 DIAGNOSIS — G43909 Migraine, unspecified, not intractable, without status migrainosus: Secondary | ICD-10-CM | POA: Insufficient documentation

## 2018-12-21 DIAGNOSIS — J45909 Unspecified asthma, uncomplicated: Secondary | ICD-10-CM | POA: Diagnosis not present

## 2018-12-21 DIAGNOSIS — Z79899 Other long term (current) drug therapy: Secondary | ICD-10-CM | POA: Insufficient documentation

## 2018-12-21 DIAGNOSIS — K219 Gastro-esophageal reflux disease without esophagitis: Secondary | ICD-10-CM | POA: Diagnosis not present

## 2018-12-21 DIAGNOSIS — G894 Chronic pain syndrome: Secondary | ICD-10-CM

## 2018-12-21 HISTORY — PX: LAPAROSCOPY: SHX197

## 2018-12-21 LAB — POCT PREGNANCY, URINE: Preg Test, Ur: NEGATIVE

## 2018-12-21 LAB — ABO/RH: ABO/RH(D): B POS

## 2018-12-21 SURGERY — LAPAROSCOPY, DIAGNOSTIC
Anesthesia: General

## 2018-12-21 MED ORDER — ONDANSETRON HCL 4 MG/2ML IJ SOLN
INTRAMUSCULAR | Status: DC | PRN
  Administered 2018-12-21: 4 mg via INTRAVENOUS

## 2018-12-21 MED ORDER — MIDAZOLAM HCL 2 MG/2ML IJ SOLN
INTRAMUSCULAR | Status: AC
  Filled 2018-12-21: qty 2

## 2018-12-21 MED ORDER — SUCCINYLCHOLINE CHLORIDE 20 MG/ML IJ SOLN
INTRAMUSCULAR | Status: AC
  Filled 2018-12-21: qty 1

## 2018-12-21 MED ORDER — KETOROLAC TROMETHAMINE 30 MG/ML IJ SOLN
INTRAMUSCULAR | Status: AC
  Filled 2018-12-21: qty 1

## 2018-12-21 MED ORDER — MIDAZOLAM HCL 2 MG/2ML IJ SOLN
INTRAMUSCULAR | Status: DC | PRN
  Administered 2018-12-21: 2 mg via INTRAVENOUS

## 2018-12-21 MED ORDER — ONDANSETRON HCL 4 MG/2ML IJ SOLN: 4.0000 mg | Freq: Once | INTRAMUSCULAR | Status: DC | PRN

## 2018-12-21 MED ORDER — LIDOCAINE HCL 4 % MT SOLN
OROMUCOSAL | Status: DC | PRN
  Administered 2018-12-21: 4 mL via TOPICAL

## 2018-12-21 MED ORDER — ROCURONIUM BROMIDE 50 MG/5ML IV SOLN
INTRAVENOUS | Status: AC
  Filled 2018-12-21: qty 1

## 2018-12-21 MED ORDER — FENTANYL CITRATE (PF) 100 MCG/2ML IJ SOLN
INTRAMUSCULAR | Status: AC
  Administered 2018-12-21: 11:00:00 25 ug via INTRAVENOUS
  Filled 2018-12-21: qty 2

## 2018-12-21 MED ORDER — LACTATED RINGERS IV SOLN
INTRAVENOUS | Status: DC
  Administered 2018-12-21: 09:00:00 via INTRAVENOUS

## 2018-12-21 MED ORDER — HYDROCODONE-ACETAMINOPHEN 5-325 MG PO TABS
1.0000 | ORAL_TABLET | Freq: Four times a day (QID) | ORAL | Status: DC | PRN
  Administered 2018-12-21: 13:00:00 1 via ORAL

## 2018-12-21 MED ORDER — DEXAMETHASONE SODIUM PHOSPHATE 10 MG/ML IJ SOLN
INTRAMUSCULAR | Status: AC
  Filled 2018-12-21: qty 1

## 2018-12-21 MED ORDER — ONDANSETRON HCL 4 MG/2ML IJ SOLN
INTRAMUSCULAR | Status: AC
  Filled 2018-12-21: qty 2

## 2018-12-21 MED ORDER — EPHEDRINE SULFATE 50 MG/ML IJ SOLN
INTRAMUSCULAR | Status: AC
  Filled 2018-12-21: qty 1

## 2018-12-21 MED ORDER — HYDROMORPHONE HCL 1 MG/ML IJ SOLN
0.2500 mg | INTRAMUSCULAR | Status: DC | PRN
  Administered 2018-12-21 (×2): 0.25 mg via INTRAVENOUS
  Administered 2018-12-21: 12:00:00 0.5 mg via INTRAVENOUS

## 2018-12-21 MED ORDER — SUCCINYLCHOLINE CHLORIDE 20 MG/ML IJ SOLN
INTRAMUSCULAR | Status: DC | PRN
  Administered 2018-12-21: 100 mg via INTRAVENOUS

## 2018-12-21 MED ORDER — DIPHENHYDRAMINE HCL 50 MG/ML IJ SOLN
25.0000 mg | Freq: Once | INTRAMUSCULAR | Status: AC
  Administered 2018-12-21: 12:00:00 25 mg via INTRAVENOUS

## 2018-12-21 MED ORDER — HYDROCODONE-ACETAMINOPHEN 5-325 MG PO TABS
ORAL_TABLET | ORAL | Status: AC
  Filled 2018-12-21: qty 1

## 2018-12-21 MED ORDER — DEXAMETHASONE SODIUM PHOSPHATE 10 MG/ML IJ SOLN
INTRAMUSCULAR | Status: DC | PRN
  Administered 2018-12-21: 10 mg via INTRAVENOUS

## 2018-12-21 MED ORDER — BUPIVACAINE HCL 0.5 % IJ SOLN
INTRAMUSCULAR | Status: DC | PRN
  Administered 2018-12-21: 7 mL

## 2018-12-21 MED ORDER — PROPOFOL 10 MG/ML IV BOLUS
INTRAVENOUS | Status: AC
  Filled 2018-12-21: qty 20

## 2018-12-21 MED ORDER — ACETAMINOPHEN NICU IV SYRINGE 10 MG/ML
INTRAVENOUS | Status: AC
  Filled 2018-12-21: qty 1

## 2018-12-21 MED ORDER — PHENYLEPHRINE HCL (PRESSORS) 10 MG/ML IV SOLN
INTRAVENOUS | Status: DC | PRN
  Administered 2018-12-21 (×3): 100 ug via INTRAVENOUS
  Administered 2018-12-21: 200 ug via INTRAVENOUS

## 2018-12-21 MED ORDER — METHYLENE BLUE 0.5 % INJ SOLN
INTRAVENOUS | Status: AC
  Filled 2018-12-21: qty 10

## 2018-12-21 MED ORDER — BUPIVACAINE HCL (PF) 0.5 % IJ SOLN
INTRAMUSCULAR | Status: AC
  Filled 2018-12-21: qty 30

## 2018-12-21 MED ORDER — PROPOFOL 10 MG/ML IV BOLUS
INTRAVENOUS | Status: DC | PRN
  Administered 2018-12-21: 180 mg via INTRAVENOUS

## 2018-12-21 MED ORDER — SUGAMMADEX SODIUM 200 MG/2ML IV SOLN
INTRAVENOUS | Status: AC
  Filled 2018-12-21: qty 2

## 2018-12-21 MED ORDER — FENTANYL CITRATE (PF) 250 MCG/5ML IJ SOLN
INTRAMUSCULAR | Status: DC | PRN
  Administered 2018-12-21: 50 ug via INTRAVENOUS
  Administered 2018-12-21: 100 ug via INTRAVENOUS

## 2018-12-21 MED ORDER — EPHEDRINE SULFATE 50 MG/ML IJ SOLN
INTRAMUSCULAR | Status: DC | PRN
  Administered 2018-12-21 (×2): 10 mg via INTRAVENOUS

## 2018-12-21 MED ORDER — LIDOCAINE HCL (CARDIAC) PF 100 MG/5ML IV SOSY
PREFILLED_SYRINGE | INTRAVENOUS | Status: DC | PRN
  Administered 2018-12-21: 100 mg via INTRATRACHEAL

## 2018-12-21 MED ORDER — FENTANYL CITRATE (PF) 250 MCG/5ML IJ SOLN
INTRAMUSCULAR | Status: AC
  Filled 2018-12-21: qty 5

## 2018-12-21 MED ORDER — SUGAMMADEX SODIUM 200 MG/2ML IV SOLN
INTRAVENOUS | Status: DC | PRN
  Administered 2018-12-21: 200 mg via INTRAVENOUS

## 2018-12-21 MED ORDER — ROCURONIUM BROMIDE 100 MG/10ML IV SOLN
INTRAVENOUS | Status: DC | PRN
  Administered 2018-12-21: 45 mg via INTRAVENOUS
  Administered 2018-12-21: 5 mg via INTRAVENOUS

## 2018-12-21 MED ORDER — HYDROCODONE-ACETAMINOPHEN 5-325 MG PO TABS: 1.0000 | ORAL_TABLET | Freq: Four times a day (QID) | ORAL | 0 refills | Status: DC | PRN

## 2018-12-21 MED ORDER — FENTANYL CITRATE (PF) 100 MCG/2ML IJ SOLN
25.0000 ug | INTRAMUSCULAR | Status: DC | PRN
  Administered 2018-12-21 (×4): 25 ug via INTRAVENOUS

## 2018-12-21 MED ORDER — LIDOCAINE HCL (PF) 2 % IJ SOLN
INTRAMUSCULAR | Status: AC
  Filled 2018-12-21: qty 10

## 2018-12-21 MED ORDER — DIPHENHYDRAMINE HCL 50 MG/ML IJ SOLN
INTRAMUSCULAR | Status: AC
  Filled 2018-12-21: qty 1

## 2018-12-21 MED ORDER — HYDROMORPHONE HCL 1 MG/ML IJ SOLN
INTRAMUSCULAR | Status: AC
  Administered 2018-12-21: 12:00:00 0.25 mg via INTRAVENOUS
  Filled 2018-12-21: qty 1

## 2018-12-21 MED ORDER — PHENYLEPHRINE HCL (PRESSORS) 10 MG/ML IV SOLN
INTRAVENOUS | Status: AC
  Filled 2018-12-21: qty 1

## 2018-12-21 SURGICAL SUPPLY — 46 items
BAG URINE DRAINAGE (UROLOGICAL SUPPLIES) ×3 IMPLANT
BLADE SURG SZ11 CARB STEEL (BLADE) ×3 IMPLANT
CANISTER SUCT 1200ML W/VALVE (MISCELLANEOUS) ×3 IMPLANT
CATH FOLEY 2WAY  5CC 16FR (CATHETERS) ×2
CATH URTH 16FR FL 2W BLN LF (CATHETERS) ×1 IMPLANT
CHLORAPREP W/TINT 26 (MISCELLANEOUS) ×3 IMPLANT
COVER WAND RF STERILE (DRAPES) ×3 IMPLANT
DERMABOND ADVANCED (GAUZE/BANDAGES/DRESSINGS) ×2
DERMABOND ADVANCED .7 DNX12 (GAUZE/BANDAGES/DRESSINGS) ×1 IMPLANT
DRAPE 3/4 80X56 (DRAPES) ×3 IMPLANT
DRAPE LEGGINS SURG 28X43 STRL (DRAPES) ×3 IMPLANT
DRAPE UNDER BUTTOCK W/FLU (DRAPES) ×3 IMPLANT
ELECT REM PT RETURN 9FT ADLT (ELECTROSURGICAL) ×3
ELECTRODE REM PT RTRN 9FT ADLT (ELECTROSURGICAL) ×1 IMPLANT
GLOVE BIO SURGEON STRL SZ7 (GLOVE) ×6 IMPLANT
GLOVE BIOGEL PI IND STRL 7.5 (GLOVE) ×1 IMPLANT
GLOVE BIOGEL PI INDICATOR 7.5 (GLOVE) ×2
GOWN STRL REUS W/ TWL LRG LVL3 (GOWN DISPOSABLE) ×2 IMPLANT
GOWN STRL REUS W/TWL LRG LVL3 (GOWN DISPOSABLE) ×4
IRRIGATION STRYKERFLOW (MISCELLANEOUS) IMPLANT
IRRIGATOR STRYKERFLOW (MISCELLANEOUS)
IV LACTATED RINGERS 1000ML (IV SOLUTION) IMPLANT
KIT PINK PAD W/HEAD ARE REST (MISCELLANEOUS) ×3
KIT PINK PAD W/HEAD ARM REST (MISCELLANEOUS) ×1 IMPLANT
KIT TURNOVER CYSTO (KITS) ×3 IMPLANT
LABEL OR SOLS (LABEL) ×3 IMPLANT
MANIPULATOR UTERINE 4.5 ZUMI (MISCELLANEOUS) ×3 IMPLANT
NEEDLE HYPO 22GX1.5 SAFETY (NEEDLE) ×3 IMPLANT
NS IRRIG 500ML POUR BTL (IV SOLUTION) ×3 IMPLANT
PACK LAP CHOLECYSTECTOMY (MISCELLANEOUS) ×3 IMPLANT
PAD OB MATERNITY 4.3X12.25 (PERSONAL CARE ITEMS) ×3 IMPLANT
PAD PREP 24X41 OB/GYN DISP (PERSONAL CARE ITEMS) ×3 IMPLANT
SCISSORS METZENBAUM CVD 33 (INSTRUMENTS) IMPLANT
SET TUBE SMOKE EVAC HIGH FLOW (TUBING) ×3 IMPLANT
SHEARS HARMONIC ACE PLUS 36CM (ENDOMECHANICALS) IMPLANT
SLEEVE ENDOPATH XCEL 5M (ENDOMECHANICALS) ×3 IMPLANT
SOL PREP PVP 2OZ (MISCELLANEOUS) ×3
SOLUTION PREP PVP 2OZ (MISCELLANEOUS) ×1 IMPLANT
SURGILUBE 2OZ TUBE FLIPTOP (MISCELLANEOUS) ×3 IMPLANT
SUT MNCRL 4-0 (SUTURE)
SUT MNCRL 4-0 27XMFL (SUTURE)
SUT VIC AB 2-0 UR6 27 (SUTURE) IMPLANT
SUTURE MNCRL 4-0 27XMF (SUTURE) IMPLANT
TROCAR ENDO BLADELESS 11MM (ENDOMECHANICALS) IMPLANT
TROCAR XCEL NON-BLD 5MMX100MML (ENDOMECHANICALS) ×3 IMPLANT
TROCAR XCEL UNIV SLVE 11M 100M (ENDOMECHANICALS) IMPLANT

## 2018-12-21 NOTE — Anesthesia Postprocedure Evaluation (Signed)
Anesthesia Post Note  Patient: Ann Morales  Procedure(s) Performed: LAPAROSCOPY DIAGNOSTIC (N/A )  Patient location during evaluation: PACU Anesthesia Type: General Level of consciousness: awake and alert Pain management: pain level controlled Vital Signs Assessment: post-procedure vital signs reviewed and stable Respiratory status: spontaneous breathing, nonlabored ventilation, respiratory function stable and patient connected to nasal cannula oxygen Cardiovascular status: blood pressure returned to baseline and stable Postop Assessment: no apparent nausea or vomiting Anesthetic complications: no     Last Vitals:  Vitals:   12/21/18 1217 12/21/18 1221  BP:  113/65  Pulse: 70 75  Resp: 16 16  Temp:  36.5 C  SpO2: 95% 97%    Last Pain:  Vitals:   12/21/18 1221  TempSrc: Temporal  PainSc: [place]

## 2018-12-21 NOTE — Transfer of Care (Signed)
Immediate Anesthesia Transfer of Care Note  Patient: Ann Morales  Procedure(s) Performed: LAPAROSCOPY DIAGNOSTIC (N/A )  Patient Location: PACU  Anesthesia Type:General  Level of Consciousness: awake, alert , oriented and patient cooperative  Airway & Oxygen Therapy: Patient Spontanous Breathing and Patient connected to face mask oxygen  Post-op Assessment: Report given to RN and Post -op Vital signs reviewed and stable  Post vital signs: Reviewed and stable  Last Vitals:  Vitals Value Taken Time  BP 99/57 12/21/18 1104  Temp    Pulse 72 12/21/18 1106  Resp 17 12/21/18 1106  SpO2 100 % 12/21/18 1106  Vitals shown include unvalidated device data.  Last Pain:  Vitals:   12/21/18 0918  TempSrc: Tympanic  PainSc: 4          Complications: No apparent anesthesia complications

## 2018-12-21 NOTE — Anesthesia Post-op Follow-up Note (Signed)
Anesthesia QCDR form completed.        

## 2018-12-21 NOTE — Anesthesia Procedure Notes (Signed)
Procedure Name: Intubation Date/Time: 12/21/2018 9:51 AM Performed by: Eben Burow, CRNA Pre-anesthesia Checklist: Patient identified, Emergency Drugs available, Suction available and Patient being monitored Patient Re-evaluated:Patient Re-evaluated prior to induction Oxygen Delivery Method: Circle system utilized Preoxygenation: Pre-oxygenation with 100% oxygen Induction Type: IV induction, Cricoid Pressure applied and Rapid sequence Laryngoscope Size: McGraph and 3 Grade View: Grade I Tube type: Oral Tube size: 7.0 mm Number of attempts: 1 Airway Equipment and Method: Stylet,  Video-laryngoscopy and LTA kit utilized Placement Confirmation: ETT inserted through vocal cords under direct vision,  positive ETCO2 and breath sounds checked- equal and bilateral Secured at: 20 cm Tube secured with: Tape Dental Injury: Teeth and Oropharynx as per pre-operative assessment

## 2018-12-21 NOTE — Interval H&P Note (Signed)
History and Physical Interval Note:  12/21/2018 9:32 AM  Ann Morales  has presented today for surgery, with the diagnosis of Pelvic pain in female R10.2 Right lower quadrant abdominal pain R10.31.  The various methods of treatment have been discussed with the patient and family. After consideration of risks, benefits and other options for treatment, the patient has consented to  Procedure(s): LAPAROSCOPY DIAGNOSTIC (N/A) as a surgical intervention.  The patient's history has been reviewed, patient examined, no change in status, stable for surgery.  I have reviewed the patient's chart and labs.  Questions were answered to the patient's satisfaction.    Prentice Docker, MD, [facility], [facility] 12/21/2018 9:32 AM

## 2018-12-21 NOTE — Anesthesia Preprocedure Evaluation (Signed)
Anesthesia Evaluation  Patient identified by MRN, date of birth, ID band Patient awake    Reviewed: Allergy & Precautions, NPO status , Patient's Chart, lab work & pertinent test results  Airway Mallampati: III  TM Distance: <3 FB     Dental  (+) Upper Dentures   Pulmonary asthma , sleep apnea , Current Smoker and Patient abstained from smoking., former smoker,    Pulmonary exam normal        Cardiovascular + Peripheral Vascular Disease  Normal cardiovascular exam     Neuro/Psych  Headaches, PSYCHIATRIC DISORDERS Anxiety Depression Bipolar Disorder    GI/Hepatic Neg liver ROS, GERD  ,  Endo/Other    Renal/GU negative Renal ROS     Musculoskeletal  (+) Arthritis ,   Abdominal Normal abdominal exam  (+)   Peds negative pediatric ROS (+)  Hematology negative hematology ROS (+)   Anesthesia Other Findings Past Medical History: No date: ADHD (attention deficit hyperactivity disorder) No date: Anxiety No date: Arthritis No date: Asthma No date: Bipolar disorder ([facility]) No date: Depression No date: GERD (gastroesophageal reflux disease) No date: Headache     Comment:  migraines No date: Sleep apnea     Comment:  cpap  Reproductive/Obstetrics                             Anesthesia Physical  Anesthesia Plan  ASA: III  Anesthesia Plan: General   Post-op Pain Management:    Induction: Intravenous, Rapid sequence and Cricoid pressure planned  PONV Risk Score and Plan:   Airway Management Planned: Oral ETT  Additional Equipment:   Intra-op Plan:   Post-operative Plan: Extubation in OR  Informed Consent: I have reviewed the patients History and Physical, chart, labs and discussed the procedure including the risks, benefits and alternatives for the proposed anesthesia with the patient or authorized representative who has indicated his/her understanding and acceptance.     Dental  advisory given  Plan Discussed with: CRNA and Surgeon  Anesthesia Plan Comments:         Anesthesia Quick Evaluation

## 2018-12-21 NOTE — Discharge Instructions (Signed)

## 2018-12-21 NOTE — Op Note (Signed)
  Operative Note    Pre-Operative Diagnosis:  1) chronic pelvic pain, female 2) right lower quadrant abdominal pain  Post-Operative Diagnosis:  1) chronic pelvic pain, female 2) right lower quadrant abdominal pain  Procedures:  Diagnostic laparoscopy  Primary Surgeon: Prentice Docker, MD   EBL: 5 mL   IVF: 750 mL   Urine output: 400 mL clear urine at end of procedure  Specimens: none  Drains: none  Complications: None   Disposition: PACU   Condition: Stable   Findings:  1) normal-appearing uterus, fallopian tubes, and ovaries 2) normal-appearing appendix 3) no evidence of endometriosis   Procedure Summary:  The patient was taken to the operating room where general anesthesia was administered and found to be adequate. She was placed in the dorsal supine lithotomy position in [place] stirrups and prepped and draped in usual sterile fashion. After a timeout was called an indwelling catheter was placed in her bladder. A sterile speculum was placed in the vagina and a single-tooth tenaculum was used to grasp the anterior lip of the cervix. A Zumi uterine manipulator was affixed to the cervix in accordance with the manufacturer's recommendations. The single-tooth tenaculum and speculum were removed from the vagina.  Attention was turned to the abdomen where after injection of local anesthetic, a 5 mm infraumbilical incision was made with the scalpel. Entry into the abdomen was obtained via Optiview trocar technique (a blunt entry technique with camera visualization through the obturator upon entry). Verification of entry into the abdomen was obtained using opening pressures. The abdomen was insufflated with CO2. The camera was introduced through the trocar with verification of atraumatic entry.  A 5 mm suprapubic port site was created approximately 3 cm cephalad to the superior portion of the pubic symphysis.  The trocar was advanced under direct intra-abdominal camera visualization  without difficulty.  A survey of the pelvis and abdomen was undertaken with the above-noted findings.  Changes to the fallopian tubes consistent with tubal ligation were noted.  A couple of small paratubal cysts were noted.  No evidence of hernia was noted bilaterally.  Given that no abnormalities were appreciated, the surgery was terminated.  The abdomen was emptied of CO2 with the aid of 5 deep breaths from anesthesia.  Both skin incisions were closed using 4-0 Monocryl in a subcuticular fashion.  The skin closure was reinforced using surgical skin glue.  The ZUMI uterine manipulator was removed from the uterus and cervix without difficulty.  The speculum was utilized to ensure that the tenaculum entry sites were hemostatic, which they were.  The Foley catheter was removed from the bladder.  The vagina was inspected to ensure that no sponges or instruments remained in the vagina.  The patient tolerated the procedure well.  Sponge, lap, needle, and instrument counts were correct x 2.  VTE prophylaxis: SCDs. Antibiotic prophylaxis: none indicated and none given. She was awakened in the operating room and was taken to the PACU in stable condition.   Prentice Docker, MD 12/21/2018 10:58 AM

## 2018-12-22 ENCOUNTER — Telehealth: Payer: Self-pay

## 2018-12-22 ENCOUNTER — Encounter: Payer: Self-pay | Admitting: Obstetrics and Gynecology

## 2018-12-22 NOTE — Telephone Encounter (Signed)
Spoke w/patient. She is taking Norco 1 q6h and Ibuprofen 600mg  about 3 hours after taking norco and every 6 hours. She hardly rested any last night.

## 2018-12-22 NOTE — Telephone Encounter (Signed)
Just got this I'd defer to you as to if you feel she needs something stronger

## 2018-12-22 NOTE — Telephone Encounter (Signed)
Patient had surgery 12/21/2018 w/[facility]. She is requesting rx stronger than what he gave her yesterday. [name] still in a lot of pain/uncomfortable. Ok to leave message as she is the only one who check voicemail. [phone]

## 2018-12-22 NOTE — Telephone Encounter (Signed)
Can you see if AMS would send in something different for her ? SDJ not in office, I texted him. But I have to leave at 4:20 today. Also, would she need to bring in the rest of the Norco to the office?

## 2018-12-22 NOTE — Telephone Encounter (Signed)
Please advise 

## 2018-12-23 ENCOUNTER — Other Ambulatory Visit: Payer: Self-pay | Admitting: Obstetrics and Gynecology

## 2018-12-23 DIAGNOSIS — G8918 Other acute postprocedural pain: Secondary | ICD-10-CM

## 2018-12-23 DIAGNOSIS — R102 Pelvic and perineal pain: Secondary | ICD-10-CM

## 2018-12-23 MED ORDER — OXYCODONE-ACETAMINOPHEN 5-325 MG PO TABS: 1.0000 | ORAL_TABLET | Freq: Four times a day (QID) | ORAL | 0 refills | Status: DC | PRN

## 2018-12-23 NOTE — Telephone Encounter (Signed)
Please see

## 2018-12-23 NOTE — Telephone Encounter (Signed)
Pt calling; called yesterday and hasn't gotten a response; would like a stronger pain medication.  The one she has is not working.  [phone]

## 2018-12-23 NOTE — Telephone Encounter (Signed)
That's fine. Let her know I sent in something a little stronger. Take as directed. Pain should be better soon.

## 2018-12-23 NOTE — Telephone Encounter (Signed)
Called pt, no answer, left detailed msg as requested by pt.

## 2018-12-23 NOTE — Telephone Encounter (Signed)
Ask her to take 2 at a time to see if that is better.

## 2018-12-23 NOTE — Telephone Encounter (Signed)
Pt aware. Is asking for a refill since she will take 2 now.

## 2019-01-03 ENCOUNTER — Ambulatory Visit: Payer: Medicaid Other | Admitting: Obstetrics and Gynecology

## 2019-01-05 ENCOUNTER — Ambulatory Visit: Payer: Medicaid Other | Admitting: Obstetrics and Gynecology

## 2019-02-18 ENCOUNTER — Ambulatory Visit: Payer: Medicaid Other | Admitting: Physician Assistant

## 2019-02-18 NOTE — Progress Notes (Deleted)
Patient: Ann Morales Female    DOB: [birth year]   43 y.o.   MRN: [id number] Visit Date: 02/18/2019  Today's Provider: Trinna Post, PA-C   No chief complaint on file.  Subjective:     HPI  Allergies  Allergen Reactions  . Codeine Hives and Shortness Of Breath  . Azithromycin Diarrhea  . Sulfa Antibiotics Other (See Comments)    Severe abdominal pain.     Current Outpatient Medications:  .  albuterol (PROVENTIL HFA;VENTOLIN HFA) 108 (90 Base) MCG/ACT inhaler, Inhale 2 puffs into the lungs every 6 (six) hours as needed for wheezing or shortness of breath., Disp: 1 Inhaler, Rfl: 5 .  amphetamine-dextroamphetamine (ADDERALL) 10 MG tablet, Take 10 mg by mouth daily at 2 PM., Disp: , Rfl: 0 .  buPROPion (WELLBUTRIN XL) 300 MG 24 hr tablet, Take 300 mg by mouth daily. , Disp: , Rfl: 1 .  clonazePAM (KLONOPIN) 0.5 MG tablet, Take 1 tablet (0.5 mg total) by mouth 2 (two) times daily as needed for anxiety. (Patient taking differently: Take 0.5 mg by mouth 2 (two) times daily. ), Disp: 60 tablet, Rfl: 3 .  HYDROcodone-acetaminophen (NORCO) 5-325 MG tablet, Take 1 tablet by mouth every 6 (six) hours as needed (breakthrough pain)., Disp: 15 tablet, Rfl: 0 .  LATUDA 40 MG TABS tablet, Take 40 mg by mouth daily after supper. , Disp: , Rfl: 2 .  lisdexamfetamine (VYVANSE) 40 MG capsule, Take 40 mg by mouth daily. , Disp: , Rfl:  .  lithium 300 MG tablet, Take 300 mg by mouth 2 (two) times daily., Disp: , Rfl: 2 .  loratadine (CLARITIN) 10 MG tablet, One daily as needed for allergies (Patient taking differently: Take 10 mg by mouth daily as needed for allergies. ), Disp: 30 tablet, Rfl: 5 .  meloxicam (MOBIC) 7.5 MG tablet, Take one to tablets daily for pain. (Patient taking differently: Take 7.5 mg by mouth daily. ), Disp: 30 tablet, Rfl: 0 .  omeprazole (PRILOSEC) 20 MG capsule, Take 1 capsule (20 mg total) by mouth daily., Disp: 90 capsule, Rfl: 1 .  oxyCODONE-acetaminophen  (PERCOCET/ROXICET) 5-325 MG tablet, Take 1 tablet by mouth every 6 (six) hours as needed (breakthrough pain)., Disp: 20 tablet, Rfl: 0 .  SUMAtriptan (IMITREX) 50 MG tablet, Take one tablet at onset of headache; may repeat in 2 hours if not better (Patient taking differently: Take 50 mg by mouth every 2 (two) hours as needed for migraine. ), Disp: 10 tablet, Rfl: 2 .  traZODone (DESYREL) 150 MG tablet, Take 150 mg by mouth at bedtime. , Disp: , Rfl: 1 .  triamcinolone cream (KENALOG) 0.1 %, Apply 1 application topically 2 (two) times daily. (Patient taking differently: Apply 1 application topically 2 (two) times daily as needed (rash). ), Disp: 30 g, Rfl: 0 .  TRINTELLIX 10 MG TABS, Take 10 mg by mouth daily. , Disp: , Rfl: 2  Review of Systems  Constitutional: Negative for appetite change, chills, fatigue and fever.  Respiratory: Negative for chest tightness and shortness of breath.   Cardiovascular: Negative for chest pain and palpitations.  Gastrointestinal: Negative for abdominal pain, nausea and vomiting.  Neurological: Negative for dizziness and weakness.    Social History   Tobacco Use  . Smoking status: Current Every Day Smoker    Packs/day: 2.00    Years: 15.00    Pack years: 30.00    Types: Cigarettes    Start date: 09/25/1994  .  Smokeless tobacco: Never Used  Substance Use Topics  . Alcohol use: No    Alcohol/week: 0.0 standard drinks      Objective:   There were no vitals taken for this visit. There were no vitals filed for this visit.There is no height or weight on file to calculate BMI.   Physical Exam   No results found for any visits on 02/18/19.     Assessment & [place], PA-C  [facility]

## 2019-02-24 ENCOUNTER — Encounter: Payer: Self-pay | Admitting: Emergency Medicine

## 2019-02-24 ENCOUNTER — Other Ambulatory Visit: Payer: Self-pay

## 2019-02-24 ENCOUNTER — Ambulatory Visit
Admission: EM | Admit: 2019-02-24 | Discharge: 2019-02-24 | Disposition: A | Discharge status: Home / Self Care | Payer: Medicaid Other | Attending: Urgent Care | Admitting: Urgent Care

## 2019-02-24 ENCOUNTER — Ambulatory Visit: Payer: Medicaid Other

## 2019-02-24 DIAGNOSIS — H66002 Acute suppurative otitis media without spontaneous rupture of ear drum, left ear: Secondary | ICD-10-CM | POA: Diagnosis present

## 2019-02-24 DIAGNOSIS — L03115 Cellulitis of right lower limb: Secondary | ICD-10-CM | POA: Diagnosis present

## 2019-02-24 MED ORDER — AMOXICILLIN-POT CLAVULANATE 875-125 MG PO TABS: 1.0000 | ORAL_TABLET | Freq: Two times a day (BID) | ORAL | 0 refills | Status: AC

## 2019-02-24 MED ORDER — FLUCONAZOLE 150 MG PO TABS: ORAL_TABLET | ORAL | 0 refills | Status: DC

## 2019-02-24 NOTE — ED Triage Notes (Addendum)
Patient c/o left ear pain that started 1 month ago.She states she ear has been draining and itching.  Patient also reports a scratch to her right ankle that is red, swollen and painful to touch.

## 2019-02-24 NOTE — Discharge Instructions (Addendum)
It was very nice seeing you today in clinic. Thank you for entrusting me with your care.   Rest and increase fluid intake. Take medication as directed. May use Tylenol and/or Ibuprofen as needed for pain/fever. Monitor ankle wound for increased signs of infection; increased pain, swelling, drainage, fever. Rest, ice, and elevate the ankle to help with swelling.   Make arrangements to follow up with your regular doctor in 1 week for re-evaluation if not improving. If your symptoms/condition worsens, please seek follow up care either here or in the ER. Please remember, our [facility] providers are "right here with you" when you need [place].   Again, it was my pleasure to take care of you today. Thank you for choosing our clinic. I hope that you start to feel better quickly.   Honor Loh, MSN, APRN, FNP-C, CEN Advanced Practice Provider [facility]

## 2019-02-25 ENCOUNTER — Other Ambulatory Visit: Payer: Self-pay | Admitting: Physician Assistant

## 2019-02-25 DIAGNOSIS — K219 Gastro-esophageal reflux disease without esophagitis: Secondary | ICD-10-CM

## 2019-02-25 NOTE — Telephone Encounter (Signed)
[facility] faxed refill request for the following medications:  omeprazole (PRILOSEC) 20 MG capsule  90 day supply Last Rx: 02/20/2017 LOV: 11/04/2018 Please advise. Thanks TNP

## 2019-02-25 NOTE — ED Provider Notes (Signed)
[place], Ronald   Name: Kember Brackley DOB: 1975/06/15 MRN: [id number] CSN: [id number] PCP: Trinna Post, PA-C  Arrival date and time:  02/24/19 1551  Chief Complaint:  Sydnee Cabal and Leg Pain   NOTE: Prior to seeing the patient today, I have reviewed the triage nursing documentation and vital signs. Clinical staff has updated patient's PMH/PSHx, current medication list, and drug allergies/intolerances to ensure comprehensive history available to assist in medical decision making.   History:   HPI: Ann Morales is a 43 y.o. female who presents today with multiple medical complaints as follows:  Otalgia: Patient presents with complaints of pain in her LEFT ear. Pain has been bothering her over approximately 1 month. She denies any associated fevers. Patient has not had any other recent upper respiratory symptoms; no cough, congestion, rhinorrhea, sneezing, or sore throat. She complains of mild associated pressure overlying the LEFT paranasal and frontal sinuses. She denies forceful nose blowing. Patient has not appreciated any otorrhea. She advises that her ability to hear from the LEFT ear is grossly normal. Patient denies history of recurrent ear infections. She has never had tympanostomy tubes in the past. Patient advising that she has not been swimming in the recent past. PMH is not significant for seasonal allergies. Despite her symptoms, patient has not taken any over the counter interventions to help improve/relieve her reported symptoms at home.   RIGHT leg wound: Patient also presents with a wound overlying the lateral aspect of her RIGHT ankle. Patient reports that she was rubbing her foot on the side of the bed in efforts to scratch an itch approximately 1 week ago. She denies feeling any pain at the time, however when she looked down she appreciated the wound. Over the course of the last week, patient reports that the ankle has become painful, swollen, and warm to the  touch. She denies any drainage or systemic signs of infection; no tachycardia, fevers, nausea, vomiting, weakness, or vertiginous symptoms. No interventions have been pursued at home at this point.   Past Medical History:  Diagnosis Date  . ADHD (attention deficit hyperactivity disorder)   . Anxiety   . Arthritis   . Asthma   . Bipolar disorder ([place])   . Depression   . GERD (gastroesophageal reflux disease)   . Headache    migraines  . History of kidney stones   . Sleep apnea    cpap    Past Surgical History:  Procedure Laterality Date  . BACK SURGERY  03/2017   cervical fusion  . CHOLECYSTECTOMY    . GALLBLADDER SURGERY    . LAPAROSCOPY N/A 12/21/2018   Procedure: LAPAROSCOPY DIAGNOSTIC;  Surgeon: Will Bonnet, MD;  Location: [facility] ORS;  Service: Gynecology;  Laterality: N/A;  . PLANTAR FASCIA RELEASE Left 06/12/2017   Procedure: ENDOSCOPIC PLANTAR FASCIOTOMY-RELEASE;  Surgeon: Samara Deist, DPM;  Location: [facility] ORS;  Service: Podiatry;  Laterality: Left;  . REPAIR EXTENSOR TENDON Left 06/12/2017   Procedure: REPAIR FLEXOR TENDON;  Surgeon: Samara Deist, DPM;  Location: [facility] ORS;  Service: Podiatry;  Laterality: Left;  . TARSAL TUNNEL RELEASE Left 06/12/2017   Procedure: TARSAL TUNNEL RELEASE-BAXTER RELEASE;  Surgeon: Samara Deist, DPM;  Location: [facility] ORS;  Service: Podiatry;  Laterality: Left;  . TUBAL LIGATION      Family History  Problem Relation Age of Onset  . Hypercholesterolemia Father   . Bipolar disorder Mother   . Hypercholesterolemia Maternal Grandmother   . Hypertension Maternal Grandmother   . Anxiety  disorder Maternal Grandmother   . Depression Maternal Grandmother   . Ovarian cancer Maternal Grandmother   . Colon cancer Maternal Grandfather 65  . Ovarian cancer Paternal Grandmother     Social History   Tobacco Use  . Smoking status: Current Every Day Smoker    Packs/day: 2.00    Years: 15.00    Pack years: 30.00    Types: Cigarettes     Start date: 09/25/1994  . Smokeless tobacco: Never Used  Substance Use Topics  . Alcohol use: No    Alcohol/week: 0.0 standard drinks  . Drug use: No    Patient Active Problem List   Diagnosis Date Noted  . Pelvic pain in female 12/21/2018  . Abdominal pain, right lower quadrant 12/21/2018  . Fusion of spine of cervical region 03/13/2017  . Cervical disc disorder with radiculopathy 02/25/2017  . Lymphedema 04/24/2016  . Varicose veins of bilateral lower extremities with pain 04/08/2016  . Chronic venous insufficiency 04/08/2016  . OSA (obstructive sleep apnea) 04/04/2016  . Adult ADHD 01/18/2016  . Bipolar I disorder, current or most recent episode depressed, with psychotic features ([place]) 01/18/2016  . Other specified anxiety disorders 01/18/2016  . Allergic rhinitis 01/15/2015  . Current smoker 01/15/2015  . H/O manic depressive disorder 01/15/2015  . H/O renal calculi 01/15/2015  . Asthma 01/15/2015  . HLD (hyperlipidemia) 10/25/2013    Home Medications:    Current Meds  Medication Sig  . albuterol (PROVENTIL HFA;VENTOLIN HFA) 108 (90 Base) MCG/ACT inhaler Inhale 2 puffs into the lungs every 6 (six) hours as needed for wheezing or shortness of breath.  . amphetamine-dextroamphetamine (ADDERALL) 10 MG tablet Take 10 mg by mouth daily at 2 PM.  . buPROPion (WELLBUTRIN XL) 300 MG 24 hr tablet Take 300 mg by mouth daily.   . clonazePAM (KLONOPIN) 0.5 MG tablet Take 1 tablet (0.5 mg total) by mouth 2 (two) times daily as needed for anxiety. (Patient taking differently: Take 0.5 mg by mouth 2 (two) times daily. )  . LATUDA 40 MG TABS tablet Take 40 mg by mouth daily after supper.   . lisdexamfetamine (VYVANSE) 40 MG capsule Take 40 mg by mouth daily.   [name] Kitchen lithium 300 MG tablet Take 300 mg by mouth 2 (two) times daily.  [name] Kitchen loratadine (CLARITIN) 10 MG tablet One daily as needed for allergies (Patient taking differently: Take 10 mg by mouth daily as needed for allergies. )  .  omeprazole (PRILOSEC) 20 MG capsule Take 1 capsule (20 mg total) by mouth daily.  . SUMAtriptan (IMITREX) 50 MG tablet Take one tablet at onset of headache; may repeat in 2 hours if not better (Patient taking differently: Take 50 mg by mouth every 2 (two) hours as needed for migraine. )  . traZODone (DESYREL) 150 MG tablet Take 150 mg by mouth at bedtime.   . TRINTELLIX 10 MG TABS Take 10 mg by mouth daily.   . [DISCONTINUED] meloxicam (MOBIC) 7.5 MG tablet Take one to tablets daily for pain. (Patient taking differently: Take 7.5 mg by mouth daily. )    Allergies:   Codeine, Azithromycin, and Sulfa antibiotics  Review of Systems (ROS): Review of Systems  Constitutional: Negative for chills and fever.  HENT: Positive for ear pain and sinus pressure. Negative for congestion, ear discharge, sore throat and tinnitus.   Respiratory: Negative for cough and shortness of breath.   Cardiovascular: Negative for chest pain and palpitations.  Gastrointestinal: Negative for nausea and vomiting.  Musculoskeletal: Positive  for joint swelling.  Skin: Positive for color change and wound. Negative for pallor and rash.  Allergic/Immunologic: Negative for environmental allergies.  Neurological: Negative for dizziness, syncope, weakness and headaches.  All other systems reviewed and are negative.    Vital Signs: Today's Vitals   02/24/19 1608 02/24/19 1609 02/24/19 1611 02/24/19 1730  BP:   100/76   Pulse:   66   Resp:   18   Temp:   98.2 F (36.8 C)   TempSrc:   Oral   SpO2:   98%   Height:  5\' 7"  (1.702 m)    PainSc: 5    5     Physical Exam: Physical Exam  Constitutional: She is oriented to person, place, and time and well-developed, well-nourished, and in no distress.  HENT:  Head: Normocephalic and atraumatic.  Right Ear: Hearing and tympanic membrane normal.  Left Ear: Hearing normal. Tympanic membrane is injected and bulging. A middle ear effusion (cloudy) is present.  Nose: Nose  normal.  Mouth/Throat: Uvula is midline, oropharynx is clear and moist and mucous membranes are normal.  Eyes: Pupils are equal, round, and reactive to light.  Cardiovascular: Normal rate, regular rhythm, normal heart sounds and intact distal pulses.  Pulmonary/Chest: Effort normal and breath sounds normal.  Musculoskeletal:     Right ankle: Swelling present. Tenderness present. Normal range of motion.     Comments: Linear wound to RIGHT lateral ankle. (+) significant swelling in ankle. (+) PMS; cap refill WNL. (+) surround erythema eminating outward approximately 3-4 cm from all wound edges. (+) warmth and TTP. No noted gait abnormalities.   Lymphadenopathy:       Head (left side): Submandibular and posterior auricular adenopathy present.  Neurological: She is alert and oriented to person, place, and time. She has normal sensation, normal strength and normal reflexes. Gait normal.  Skin: Skin is warm and dry. No rash noted. She is not diaphoretic.  Psychiatric: Mood, memory, affect and judgment normal.  Nursing note and vitals reviewed.   Urgent Care Treatments / Results:   LABS: PLEASE NOTE: all labs that were ordered this encounter are listed, however only abnormal results are displayed. Labs Reviewed - No data to display  EKG: -None  RADIOLOGY: DG Ankle Complete Right  Result Date: 02/24/2019 CLINICAL DATA:  Soft tissue injury to the right ankle 1 week ago. Increased pain. Also erythema, warmth and swelling. EXAM: RIGHT ANKLE - COMPLETE 3+ VIEW COMPARISON:  None. FINDINGS: No fracture or bone lesion. Ankle joint normally spaced and aligned. No bone resorption to suggest osteomyelitis. There is diffuse subcutaneous soft tissue edema. No focal soft tissue abnormality is seen to suggest a mass or abscess. No soft tissue air. IMPRESSION: 1. No fracture, bone lesion or ankle joint abnormality. 2. No soft tissue air. Electronically Signed   By: Lajean Manes M.D.   On: 02/24/2019 17:01     PROCEDURES: Procedures  MEDICATIONS RECEIVED THIS VISIT: Medications - No data to display  PERTINENT CLINICAL COURSE NOTES/UPDATES:   Initial Impression / Assessment and Plan / Urgent Care Course:  Pertinent labs & imaging results that were available during my care of the patient were personally reviewed by me and considered in my medical decision making (see lab/imaging section of note for values and interpretations).  Ann Morales is a 43 y.o. female who presents to [facility] today with complaints of Otalgia and Leg Pain   Patient is well appearing overall in clinic today. She does not  appear to be in any acute distress. Presenting symptoms (see HPI) and exam as documented above. Patient presents with multiple medical complaints today. Will address as follows:  Otalgia: Exam reveals paranasal and frontal sinus tenderness. There is (+) LAD. TM with cloudy MEE. Given chronicity of symptoms, will proceed with treatment for AOME using a 10 day course of amoxicillin-clavulanate. Discussed supportive care measures at home during acute phase of illness. Patient to rest as much as possible. She was encouraged to ensure adequate hydration (water and ORS). She was encouraged to keep ear clean and dry. Patient may use APAP and/or IBU on an as needed basis for pain/fever. Patient has has a history of vulvovaginal candidiasis in the past while on oral antimicrobial therapy. Will send in prophylactic fluconazole dose (150 mg x 1 - may repeat in 72 hours if still symptomatic) for patient to use should she develop symptoms.  Extremity wound: Wound warm, erythematous, and tender to palpation. There is significant swelling. Diagnostic plain films of the ankle obtained to rule out soft tissue gas collections that would suggest osteomyelitis; results negative. Prescribed amoxicillin-clavulanate being used for her AOME should cover extremity wound as well. Patient educated on need for daily  wound care by clinical staff. She was encouraged to keep wound clean and dry. Shewas instructed to apply antibiotic ointment 1-2 times daily. Wound may be left open to air while at home, however she was encouraged to cover area while in public to prevent infection. Patient to monitor for signs and symptoms of infection, which would include increased redness, swelling, streaking, drainage, pain, and the development of a fever.    Discussed follow up with primary care physician in 1 week for re-evaluation. I have reviewed the follow up and strict return precautions for any new or worsening symptoms. Patient is aware of symptoms that would be deemed urgent/emergent, and would thus require further evaluation either here or in the emergency department. At the time of discharge, she verbalized understanding and consent with the discharge plan as it was reviewed with her. All questions were fielded by provider and/or clinic staff prior to patient discharge.    Final Clinical Impressions / Urgent Care Diagnoses:   Final diagnoses:  Non-recurrent acute suppurative otitis media of left ear without spontaneous rupture of tympanic membrane  Cellulitis of right lower extremity    New Prescriptions:  [place] Controlled Substance Registry consulted? Not Applicable  Meds ordered this encounter  Medications  . fluconazole (DIFLUCAN) 150 MG tablet    Sig: Take 1 tablet (150 mg) PO x 1 dose. May repeat 150 mg dose in 3 days if still symptomatic.    Dispense:  2 tablet    Refill:  0  . amoxicillin-clavulanate (AUGMENTIN) 875-125 MG tablet    Sig: Take 1 tablet by mouth 2 (two) times daily for 10 days.    Dispense:  20 tablet    Refill:  0    Recommended Follow up Care:  Patient encouraged to follow up with the following provider within the specified time frame, or sooner as dictated by the severity of her symptoms. As always, she was instructed that for any urgent/emergent care needs, she should seek care either  here or in the emergency department for more immediate evaluation.  Follow-up Information    Trinna Post, PA-C In 1 week.   Specialty: Physician Assistant Why: General reassessment of symptoms if not improving Contact information: [address] [place] [address] [phone]  NOTE: This note was prepared using Lobbyist along with smaller Company secretary. Despite my best ability to proofread, there is the potential that transcriptional errors may still occur from this process, and are completely unintentional.    Karen Kitchens, NP 02/25/19 1713

## 2019-02-28 MED ORDER — OMEPRAZOLE 20 MG PO CPDR: 20.0000 mg | DELAYED_RELEASE_CAPSULE | Freq: Every day | ORAL | 1 refills | Status: DC

## 2019-02-28 NOTE — Telephone Encounter (Signed)
L.O.V. was on 11/04/2018 and no upcoming appointment.

## 2019-03-14 ENCOUNTER — Other Ambulatory Visit: Payer: Self-pay | Admitting: Physician Assistant

## 2019-03-14 DIAGNOSIS — R102 Pelvic and perineal pain: Secondary | ICD-10-CM

## 2019-03-14 DIAGNOSIS — G43809 Other migraine, not intractable, without status migrainosus: Secondary | ICD-10-CM

## 2019-03-14 DIAGNOSIS — L409 Psoriasis, unspecified: Secondary | ICD-10-CM

## 2019-03-14 NOTE — Telephone Encounter (Signed)
[facility] refill request for the following medications:  SUMAtriptan (IMITREX) 50 MG tablet   Please advise.

## 2019-03-15 MED ORDER — IBUPROFEN 800 MG PO TABS: 800.0000 mg | ORAL_TABLET | Freq: Three times a day (TID) | ORAL | 0 refills | Status: DC | PRN

## 2019-03-15 MED ORDER — TRIAMCINOLONE ACETONIDE 0.1 % EX CREA: 1.0000 "application " | TOPICAL_CREAM | Freq: Two times a day (BID) | CUTANEOUS | 0 refills | Status: DC

## 2019-03-15 MED ORDER — SUMATRIPTAN SUCCINATE 50 MG PO TABS: ORAL_TABLET | ORAL | 0 refills | Status: DC

## 2019-03-15 NOTE — Telephone Encounter (Signed)
Pt advised on ibuprofen and triamcinolone cream being refilled, wants to know about her prescription for Imitrex for her migraines.

## 2019-03-15 NOTE — Addendum Note (Signed)
Addended by: Trinna Post on: 03/15/2019 04:28 PM   Modules accepted: Orders

## 2019-03-15 NOTE — Telephone Encounter (Signed)
Refilled #10 imitrex. She hasn't been evaluated in one year for migraines so she should make an appointment in the next couple of months to be re-evaluated.

## 2019-03-15 NOTE — Telephone Encounter (Signed)
Refilled ibuprofen however this should be a short term medication and not taken every day. I have given 30 tablets. Sent in triamcinolone cream.

## 2019-03-15 NOTE — Telephone Encounter (Signed)
Pt called today asking for a refill on her Imitrex and a cream for her rash.  She does not remember the name of the cream but pt says it was prescribed at the end of the summer.  She also asked for ibuprofen 800 mg that Bob prescribed for her for her menstrual cycles.  [facility]  CB#  [phone]

## 2019-03-16 ENCOUNTER — Telehealth: Payer: Medicaid Other | Admitting: Nurse Practitioner

## 2019-03-16 DIAGNOSIS — R05 Cough: Secondary | ICD-10-CM | POA: Diagnosis not present

## 2019-03-16 DIAGNOSIS — J0100 Acute maxillary sinusitis, unspecified: Secondary | ICD-10-CM

## 2019-03-16 DIAGNOSIS — J45901 Unspecified asthma with (acute) exacerbation: Secondary | ICD-10-CM

## 2019-03-16 DIAGNOSIS — R059 Cough, unspecified: Secondary | ICD-10-CM

## 2019-03-16 MED ORDER — PREDNISONE 20 MG PO TABS: ORAL_TABLET | ORAL | 0 refills | Status: DC

## 2019-03-16 MED ORDER — ALBUTEROL SULFATE HFA 108 (90 BASE) MCG/ACT IN AERS: 2.0000 | INHALATION_SPRAY | Freq: Four times a day (QID) | RESPIRATORY_TRACT | 0 refills | Status: DC | PRN

## 2019-03-16 MED ORDER — AMOXICILLIN-POT CLAVULANATE 875-125 MG PO TABS: 1.0000 | ORAL_TABLET | Freq: Two times a day (BID) | ORAL | 0 refills | Status: DC

## 2019-03-16 NOTE — Progress Notes (Signed)
Visit for Asthma  Based on what you have shared with me, it looks like you may have a flare up of your asthma.  Asthma is a chronic (ongoing) lung disease which results in airway obstruction, inflammation and hyper-responsiveness.   Asthma symptoms vary from person to person, with common symptoms including nighttime awakening and decreased ability to participate in normal activities as a result of shortness of breath. It is often triggered by changes in weather, changes in the season, changes in air temperature, or inside (home, school, daycare or work) allergens such as animal dander, mold, mildew, woodstoves or cockroaches.   It can also be triggered by hormonal changes, extreme emotion, physical exertion or an upper respiratory tract illness.     It is important to identify the trigger, and then eliminate or avoid the trigger if possible.   If you have been prescribed medications to be taken on a regular basis, it is important to follow the asthma action plan and to follow guidelines to adjust medication in response to increasing symptoms of decreased peak expiratory flow rate  Treatment: I have prescribed: Albuterol (Proventil HFA; Ventolin HFA) 108 (90 Base) MCG/ACT Inhaler 2 puffs into the lungs every six hours as needed for wheezing or shortness of breath and Prednisone 40mg by mouth per day for 5 - 7 days  HOME CARE . Only take medications as instructed by your medical team. . Consider wearing a mask or scarf to improve breathing air temperature have been shown to decrease irritation and decrease exacerbations . Get rest. . Taking a steamy shower or using a humidifier may help nasal congestion sand ease sore throat pain. You can place a towel over your head and breathe in the steam from hot water coming from a faucet. . Using a saline nasal spray works much the same way.  . Cough  drops, hare candies and sore throat lozenges may ease your cough.  . Avoid close contacts especially the very you and the elderly . Cover your mouth if you cough or sneeze . Always remember to wash your hands.    GET HELP RIGHT AWAY IF: . You develop worsening symptoms; breathlessness at rest, drowsy, confused or agitated, unable to speak in full sentences . You have coughing fits . You develop a severe headache or visual changes . You develop shortness of breath, difficulty breathing or start having chest pain . Your symptoms persist after you have completed your treatment plan . If your symptoms do not improve within 10 days  MAKE SURE YOU . Understand these instructions. . Will watch your condition. . Will get help right away if you are not doing well or get worse.   Your e-visit answers were reviewed by a board certified advanced clinical practitioner to complete your personal care plan, Depending upon the condition, your plan could have included both over the counter or prescription medications.  Please review your pharmacy choice. Your safety is important to us. If you have drug allergies check your prescription carefully. You can use MyChart to ask questions about today's visit, request a non-urgent call back, or ask for a work or school excuse for 24 hours related to this e-Visit. If it has been greater than 24 hours you will need to follow up with your provider, or enter a new e-Visit to address those concerns.  You will get an e-mail in the next two days asking about your experience. I hope that your e-visit has been valuable and will speed your recovery.   Thank you for using e-visits.  5-10 minutes spent reviewing and documenting in chart.  

## 2019-03-16 NOTE — Addendum Note (Signed)
Addended by: Chevis Pretty on: 03/16/2019 11:16 AM   Modules accepted: Orders

## 2019-03-18 ENCOUNTER — Encounter: Payer: Self-pay | Admitting: Emergency Medicine

## 2019-03-25 ENCOUNTER — Telehealth: Payer: Medicaid Other | Admitting: Family

## 2019-03-25 DIAGNOSIS — R0602 Shortness of breath: Secondary | ICD-10-CM

## 2019-03-25 DIAGNOSIS — R059 Cough, unspecified: Secondary | ICD-10-CM

## 2019-03-25 DIAGNOSIS — R05 Cough: Secondary | ICD-10-CM

## 2019-03-25 NOTE — Progress Notes (Signed)
Based on what you shared with me, I feel your condition warrants further evaluation and I recommend that you be seen for a face to face office visit.   Given you are having shortness of breath and cough and you are already on antibiotics, you need to be seen face to face to rule out pneumonia.    NOTE: If you entered your credit card information for this eVisit, you will not be charged. You may see a "hold" on your card for the $35 but that hold will drop off and you will not have a charge processed.   If you are having a true medical emergency please call 911.      For an urgent face to face visit, [facility] has five urgent care centers for your convenience:      NEW:  [facility] [place] at [place] Get Driving Directions [id number] [place], [place] [address] . 10 am - 6pm Monday - Friday    [facility] [place] [facility]) Get Driving Directions [id number] [address] [place], [place] [address] . 10 am to 8 pm Monday-Friday . 12 pm to 8 pm [facility] at [facility] Get Driving Directions [id number] [place], [place], [place] [address] . 8 am to 8 pm Monday-Friday . 9 am to 6 pm Saturday . 11 am to 6 pm Sunday     [facility] at [facility] Get Driving Directions  [id number] [address] [place], [place] [address] . 8 am to 8 pm Monday-Friday . 8 am to 4 pm [facility] Urgent Care at [place] Get Driving Directions [id number] [place], [place] [address] . 12 pm to 6 pm Monday-Friday      Your e-visit answers were reviewed by a board certified advanced clinical practitioner to complete your personal care plan.  Thank you for using e-Visits.

## 2019-04-04 NOTE — Progress Notes (Deleted)
Patient: Ann Morales Female    DOB: December 16, 1975   44 y.o.   MRN: [id number] Visit Date: 04/04/2019  Today's Provider: Trinna Post, PA-C   No chief complaint on file.  Subjective:     HPI  Allergies  Allergen Reactions  . Codeine Hives and Shortness Of Breath  . Azithromycin Diarrhea  . Sulfa Antibiotics Other (See Comments)    Severe abdominal pain.     Current Outpatient Medications:  .  albuterol (VENTOLIN HFA) 108 (90 Base) MCG/ACT inhaler, Inhale 2 puffs into the lungs every 6 (six) hours as needed for wheezing or shortness of breath., Disp: 18 g, Rfl: 0 .  amoxicillin-clavulanate (AUGMENTIN) 875-125 MG tablet, Take 1 tablet by mouth 2 (two) times daily., Disp: 14 tablet, Rfl: 0 .  amphetamine-dextroamphetamine (ADDERALL) 10 MG tablet, Take 10 mg by mouth daily at 2 PM., Disp: , Rfl: 0 .  buPROPion (WELLBUTRIN XL) 300 MG 24 hr tablet, Take 300 mg by mouth daily. , Disp: , Rfl: 1 .  clonazePAM (KLONOPIN) 0.5 MG tablet, Take 1 tablet (0.5 mg total) by mouth 2 (two) times daily as needed for anxiety. (Patient taking differently: Take 0.5 mg by mouth 2 (two) times daily. ), Disp: 60 tablet, Rfl: 3 .  fluconazole (DIFLUCAN) 150 MG tablet, Take 1 tablet (150 mg) PO x 1 dose. May repeat 150 mg dose in 3 days if still symptomatic., Disp: 2 tablet, Rfl: 0 .  ibuprofen (ADVIL) 800 MG tablet, Take 1 tablet (800 mg total) by mouth every 8 (eight) hours as needed., Disp: 30 tablet, Rfl: 0 .  LATUDA 40 MG TABS tablet, Take 40 mg by mouth daily after supper. , Disp: , Rfl: 2 .  lisdexamfetamine (VYVANSE) 40 MG capsule, Take 40 mg by mouth daily. , Disp: , Rfl:  .  lithium 300 MG tablet, Take 300 mg by mouth 2 (two) times daily., Disp: , Rfl: 2 .  loratadine (CLARITIN) 10 MG tablet, One daily as needed for allergies (Patient taking differently: Take 10 mg by mouth daily as needed for allergies. ), Disp: 30 tablet, Rfl: 5 .  omeprazole (PRILOSEC) 20 MG capsule, Take 1 capsule  (20 mg total) by mouth daily., Disp: 90 capsule, Rfl: 1 .  predniSONE (DELTASONE) 20 MG tablet, 2 po at sametime daily for 5 days, Disp: 10 tablet, Rfl: 0 .  SUMAtriptan (IMITREX) 50 MG tablet, Take one tablet at onset of headache; may repeat in 2 hours if not better, Disp: 10 tablet, Rfl: 0 .  traZODone (DESYREL) 150 MG tablet, Take 150 mg by mouth at bedtime. , Disp: , Rfl: 1 .  triamcinolone cream (KENALOG) 0.1 %, Apply 1 application topically 2 (two) times daily., Disp: 30 g, Rfl: 0 .  TRINTELLIX 10 MG TABS, Take 10 mg by mouth daily. , Disp: , Rfl: 2  Review of Systems  Social History   Tobacco Use  . Smoking status: Current Every Day Smoker    Packs/day: 2.00    Years: 15.00    Pack years: 30.00    Types: Cigarettes    Start date: 09/25/1994  . Smokeless tobacco: Never Used  Substance Use Topics  . Alcohol use: No    Alcohol/week: 0.0 standard drinks      Objective:   There were no vitals taken for this visit. There were no vitals filed for this visit.There is no height or weight on file to calculate BMI.   Physical Exam  No results found for any visits on 04/05/19.     Assessment & Lamar, PA-C  [facility]

## 2019-04-05 ENCOUNTER — Ambulatory Visit: Payer: Medicaid Other | Admitting: Physician Assistant

## 2019-04-05 ENCOUNTER — Telehealth: Payer: Self-pay | Admitting: Physician Assistant

## 2019-04-05 NOTE — Telephone Encounter (Signed)
Letter was print and placed in box to be mailed.

## 2019-04-05 NOTE — Telephone Encounter (Signed)
Can we print 2 no show letter and mail? I have already sent it through [facility]. Thanks.

## 2019-04-12 ENCOUNTER — Telehealth: Payer: Medicaid Other | Admitting: Physician Assistant

## 2019-04-12 DIAGNOSIS — H9201 Otalgia, right ear: Secondary | ICD-10-CM

## 2019-04-12 DIAGNOSIS — J349 Unspecified disorder of nose and nasal sinuses: Secondary | ICD-10-CM

## 2019-04-12 MED ORDER — IPRATROPIUM BROMIDE 0.03 % NA SOLN: 2.0000 | Freq: Three times a day (TID) | NASAL | 0 refills | Status: DC | PRN

## 2019-04-12 MED ORDER — CETIRIZINE HCL 10 MG PO TABS: 10.0000 mg | ORAL_TABLET | Freq: Every day | ORAL | 11 refills | Status: DC

## 2019-04-12 NOTE — Progress Notes (Signed)
We are sorry that you are not feeling well.  Here is how we plan to help!  Based on what you have shared with me it looks like you have sinusitis.  Sinusitis is inflammation and infection in the sinus cavities of the head.  Based on your presentation I believe you most likely have Acute Viral Sinusitis.This is an infection most likely caused by a virus.    The vast majority of sinus infections are viral in nature and thus do not require antibiotics. There is not specific treatment for viral sinusitis other than to help you with the symptoms until the infection runs its course.  You may use an oral decongestant such as Mucinex D or if you have glaucoma or high blood pressure use plain Mucinex. Saline nasal spray help and can safely be used as often as needed for congestion, I have prescribed: Ipratropium Bromide nasal spray 0.03% 2 sprays in eah nostril 2-3 times a day. I have also prescribed cetirizine to help relieve congestion and ear pressure.   Some authorities believe that zinc sprays or the use of Echinacea may shorten the course of your symptoms.  Sinus infections are not as easily transmitted as other respiratory infection, however we still recommend that you avoid close contact with loved ones, especially the very young and elderly.  Remember to wash your hands thoroughly throughout the day as this is the number one way to prevent the spread of infection!  Home Care:  Only take medications as instructed by your medical team.  Do not take these medications with alcohol.  A steam or ultrasonic humidifier can help congestion.  You can place a towel over your head and breathe in the steam from hot water coming from a faucet.  Avoid close contacts especially the very young and the elderly.  Cover your mouth when you cough or sneeze.  Always remember to wash your hands.  Get Help Right Away If:  You develop worsening fever or sinus pain.  You develop a severe head ache or visual  changes.  Your symptoms persist after you have completed your treatment plan.  Make sure you  Understand these instructions.  Will watch your condition.  Will get help right away if you are not doing well or get worse.  Your e-visit answers were reviewed by a board certified advanced clinical practitioner to complete your personal care plan.  Depending on the condition, your plan could have included both over the counter or prescription medications.  If there is a problem please reply  once you have received a response from your provider.  Your safety is important to [place].  If you have drug allergies check your prescription carefully.    You can use MyChart to ask questions about today's visit, request a non-urgent call back, or ask for a work or school excuse for 24 hours related to this e-Visit. If it has been greater than 24 hours you will need to follow up with your provider, or enter a new e-Visit to address those concerns.  You will get an e-mail in the next two days asking about your experience.  I hope that your e-visit has been valuable and will speed your recovery. Thank you for using e-visits.  Greater than 5 minutes, yet less than 10 minutes of time have been spent researching, coordinating, and implementing care for this patient today.

## 2019-04-19 ENCOUNTER — Telehealth: Payer: Medicaid Other | Admitting: Nurse Practitioner

## 2019-04-19 DIAGNOSIS — H6690 Otitis media, unspecified, unspecified ear: Secondary | ICD-10-CM

## 2019-04-19 MED ORDER — TOBRAMYCIN-DEXAMETHASONE 0.3-0.1 % OP SUSP: OPHTHALMIC | 0 refills | Status: DC

## 2019-04-19 NOTE — Progress Notes (Signed)
E Visit for Swimmer's Ear  We are sorry that you are not feeling well. Here is how we plan to help!  Patient was contacted by phone to verify symptoms. She said her ear has been itching and scratching it she cut iut and that is what is possible draining. She said is ear red and ten  Based on what you have shared with me it looks like you have an infection inside the ear canal. This can cause redness or swelling, or irritation,  of your outer ear canal.  These symptoms usually occur within a few days of a minor cut or sore..  Your ear canal is a tube that goes from the opening of the ear to the eardrum.  When water stays in your ear canal, germs can grow.    It is not contagious and oral antibiotics are not required to treat uncomplicated ear canal infection.  The usual symptoms include: Itching inside the ear, Redness or a sense of swelling in the ear, Pain when the ear is tugged on when pressure is placed on the ear, Pus draining from the infected ear. and I have prescribed: Tobramycin 0.3% and dexamethasone 0.1% opthmalmic suspension two drops in affected ears four times a day for 7 days   In certain cases swimmer's ear may progress to a more serious bacterial infection of the middle or inner ear.  If you have a fever 102 and up and significantly worsening symptoms, this could indicate a more serious infection moving to the middle/inner and needs face to face evaluation in an office by a provider.  Your symptoms should improve over the next 3 days and should resolve in about 7 days.  HOME CARE:   Wash your hands frequently.  Do not place the tip of the bottle on your ear or touch it with your fingers.  You can take Acetominophen 650 mg every 4-6 hours as needed for pain.  If pain is severe or moderate, you can apply a heating pad (set on low) or hot water bottle (wrapped in a towel) to outer ear for 20 minutes.  This will also increase drainage.  Avoid ear plugs  Do not use Q-tips  After  showers, help the water run out by tilting your head to one side.  GET HELP RIGHT AWAY IF:   Fever is over 102.2 degrees.  You develop progressive ear pain or hearing loss.  Ear symptoms persist longer than 3 days after treatment.  MAKE SURE YOU:   Understand these instructions.  Will watch your condition.  Will get help right away if you are not doing well or get worse.  TO PREVENT SWIMMER'S EAR:  Use a bathing cap or custom fitted swim molds to keep your ears dry.  Towel off after swimming to dry your ears.  Tilt your head or pull your earlobes to allow the water to escape your ear canal.  If there is still water in your ears, consider using a hairdryer on the lowest setting.  Thank you for choosing an e-visit. Your e-visit answers were reviewed by a board certified advanced clinical practitioner to complete your personal care plan. Depending upon the condition, your plan could have included both over the counter or prescription medications. Please review your pharmacy choice. Be sure that the pharmacy you have chosen is open so that you can pick up your prescription now.  If there is a problem you may message your provider in [facility] to have the prescription routed to  another pharmacy. Your safety is important to [place]. If you have drug allergies check your prescription carefully.  For the next 24 hours, you can use MyChart to ask questions about today's visit, request a non-urgent call back, or ask for a work or school excuse from your e-visit provider. You will get an email in the next two days asking about your experience. I hope that your e-visit has been valuable and will speed your recovery.    5-10 minutes spent reviewing and documenting in chart.

## 2019-04-20 MED ORDER — NEOMYCIN-POLYMYXIN-HC 3.5-10000-1 OT SOLN: 4.0000 [drp] | Freq: Four times a day (QID) | OTIC | 0 refills | Status: DC

## 2019-04-20 NOTE — Progress Notes (Signed)
Changed tobradex to cortisporin otic suspension.

## 2019-04-20 NOTE — Addendum Note (Signed)
Addended by: Chevis Pretty on: 04/20/2019 09:34 AM   Modules accepted: Orders

## 2019-04-21 NOTE — Addendum Note (Signed)
Addended by: Chevis Pretty on: 04/21/2019 10:38 AM   Modules accepted: Orders

## 2019-04-25 ENCOUNTER — Ambulatory Visit: Payer: Medicaid Other | Admitting: Physician Assistant

## 2019-04-25 ENCOUNTER — Encounter: Payer: Self-pay | Admitting: Physician Assistant

## 2019-04-25 ENCOUNTER — Other Ambulatory Visit: Payer: Self-pay

## 2019-04-25 VITALS — BP 110/70 | HR 88 | Temp 96.2°F | Resp 16 | Wt 223.0 lb

## 2019-04-25 DIAGNOSIS — N3281 Overactive bladder: Secondary | ICD-10-CM

## 2019-04-25 DIAGNOSIS — N920 Excessive and frequent menstruation with regular cycle: Secondary | ICD-10-CM

## 2019-04-25 MED ORDER — MIRABEGRON ER 25 MG PO TB24: 25.0000 mg | ORAL_TABLET | Freq: Every day | ORAL | 0 refills | Status: DC

## 2019-04-25 NOTE — Patient Instructions (Signed)
Menorrhagia Menorrhagia is when your menstrual periods are heavy or last longer than normal. Follow these instructions at home: Medicines   Take over-the-counter and prescription medicines exactly as told by your doctor. This includes iron pills.  Do not change or switch medicines without asking your doctor.  Do not take aspirin or medicines that contain aspirin 1 week before or during your period. Aspirin may make bleeding worse. General instructions  If you need to change your pad or tampon more than once every 2 hours, limit your activity until the bleeding stops.  Iron pills can cause problems when pooping (constipation). To prevent or treat pooping problems while taking prescription iron pills, your doctor may suggest that you: ? Drink enough fluid to keep your pee (urine) clear or pale yellow. ? Take over-the-counter or prescription medicines. ? Eat foods that are high in fiber. These foods include:  Fresh fruits and vegetables.  Whole grains.  Beans. ? Limit foods that are high in fat and processed sugars. This includes fried and sweet foods.  Eat healthy meals and foods that are high in iron. Foods that have a lot of iron include: ? Leafy green vegetables. ? Meat. ? Liver. ? Eggs. ? Whole grain breads and cereals.  Do not try to lose weight until your heavy bleeding has stopped and you have normal amounts of iron in your blood. If you need to lose weight, work with your doctor.  Keep all follow-up visits as told by your doctor. This is important. Contact a doctor if:  You soak through a pad or tampon every 1 or 2 hours, and this happens every time you have a period.  You need to use pads and tampons at the same time because you are bleeding so much.  You are taking medicine and you: ? Feel sick to your stomach (nauseous). ? Throw up (vomit). ? Have watery poop (diarrhea).  You have other problems that may be related to the medicine you are taking. Get help  right away if:  You soak through more than a pad or tampon in 1 hour.  You pass clots bigger than 1 inch (2.5 cm) wide.  You feel short of breath.  You feel like your heart is beating too fast.  You feel dizzy or you pass out (faint).  You feel very weak or tired. Summary  Menorrhagia is when your menstrual periods are heavy or last longer than normal.  Take over-the-counter and prescription medicines exactly as told by your doctor. This includes iron pills.  Contact a doctor if you soak through more than a pad or tampon in 1 hour or are passing large clots. This information is not intended to replace advice given to you by your health care provider. Make sure you discuss any questions you have with your health care provider. Document Revised: 06/03/2017 Document Reviewed: 03/17/2016 Elsevier Patient Education  2020 Elsevier Inc.  

## 2019-04-25 NOTE — Progress Notes (Addendum)
Patient: Ann Morales Female    DOB: Jun 15, 1975   44 y.o.   MRN: [id number] Visit Date: 04/25/2019  Today's Provider: Trinna Post, PA-C   Chief Complaint  Patient presents with  . Menorrhagia   Subjective:     HPI  Menorrhagia Patient comes in office today to address problems with heavy bleeding and pelvic pain when she is on her menstrual cycle. Patient reports that she has a history of heavy menses and clots. Patient has had extensive evaluation with OBGYN Dr. Glennon Mac from 11/2018-01/2019. She had normal pelvic/transvaginal ultrasound without thickened uterine lining or fibroids in 11/2018. She underwent exploratory laparotomy with no significant findings. She said she mentioned this to him but he wanted to address the pelvic pain first. Patient denies any history of fibroids or ovarian cysts. Patient states that she has two menstrual cycles a month, coming every 20-21 days and last for 7 days.   Patient would also like to address today difficulty holding her urine and leaking for over a year. She reports urge incontinence and stress incontinence.   Allergies  Allergen Reactions  . Codeine Hives and Shortness Of Breath  . Azithromycin Diarrhea  . Sulfa Antibiotics Other (See Comments)    Severe abdominal pain.     Current Outpatient Medications:  .  albuterol (VENTOLIN HFA) 108 (90 Base) MCG/ACT inhaler, Inhale 2 puffs into the lungs every 6 (six) hours as needed for wheezing or shortness of breath., Disp: 18 g, Rfl: 0 .  amoxicillin-clavulanate (AUGMENTIN) 875-125 MG tablet, Take 1 tablet by mouth 2 (two) times daily., Disp: 14 tablet, Rfl: 0 .  amphetamine-dextroamphetamine (ADDERALL) 10 MG tablet, Take 10 mg by mouth daily at 2 PM., Disp: , Rfl: 0 .  buPROPion (WELLBUTRIN XL) 300 MG 24 hr tablet, Take 300 mg by mouth daily. , Disp: , Rfl: 1 .  cetirizine (ZYRTEC) 10 MG tablet, Take 1 tablet (10 mg total) by mouth daily., Disp: 30 tablet, Rfl: 11 .  clonazePAM  (KLONOPIN) 0.5 MG tablet, Take 1 tablet (0.5 mg total) by mouth 2 (two) times daily as needed for anxiety. (Patient taking differently: Take 0.5 mg by mouth 2 (two) times daily. ), Disp: 60 tablet, Rfl: 3 .  fluconazole (DIFLUCAN) 150 MG tablet, Take 1 tablet (150 mg) PO x 1 dose. May repeat 150 mg dose in 3 days if still symptomatic., Disp: 2 tablet, Rfl: 0 .  ibuprofen (ADVIL) 800 MG tablet, Take 1 tablet (800 mg total) by mouth every 8 (eight) hours as needed., Disp: 30 tablet, Rfl: 0 .  ipratropium (ATROVENT) 0.03 % nasal spray, Place 2 sprays into both nostrils 3 (three) times daily as needed for rhinitis., Disp: 30 mL, Rfl: 0 .  LATUDA 40 MG TABS tablet, Take 40 mg by mouth daily after supper. , Disp: , Rfl: 2 .  lisdexamfetamine (VYVANSE) 40 MG capsule, Take 40 mg by mouth daily. , Disp: , Rfl:  .  lithium 300 MG tablet, Take 300 mg by mouth 2 (two) times daily., Disp: , Rfl: 2 .  loratadine (CLARITIN) 10 MG tablet, One daily as needed for allergies (Patient taking differently: Take 10 mg by mouth daily as needed for allergies. ), Disp: 30 tablet, Rfl: 5 .  neomycin-polymyxin-hydrocortisone (CORTISPORIN) OTIC solution, Place 4 drops into the right ear 4 (four) times daily., Disp: 10 mL, Rfl: 0 .  omeprazole (PRILOSEC) 20 MG capsule, Take 1 capsule (20 mg total) by mouth daily., Disp: 90  capsule, Rfl: 1 .  predniSONE (DELTASONE) 20 MG tablet, 2 po at sametime daily for 5 days, Disp: 10 tablet, Rfl: 0 .  SUMAtriptan (IMITREX) 50 MG tablet, Take one tablet at onset of headache; may repeat in 2 hours if not better, Disp: 10 tablet, Rfl: 0 .  tobramycin-dexamethasone (TOBRADEX) ophthalmic solution, 3 drops in right ear TID, Disp: 5 mL, Rfl: 0 .  triamcinolone cream (KENALOG) 0.1 %, Apply 1 application topically 2 (two) times daily., Disp: 30 g, Rfl: 0 .  TRINTELLIX 10 MG TABS, Take 10 mg by mouth daily. , Disp: , Rfl: 2  Review of Systems  Musculoskeletal: Positive for neck pain.    Social  History   Tobacco Use  . Smoking status: Current Every Day Smoker    Packs/day: 2.00    Years: 15.00    Pack years: 30.00    Types: Cigarettes    Start date: 09/25/1994  . Smokeless tobacco: Never Used  Substance Use Topics  . Alcohol use: No    Alcohol/week: 0.0 standard drinks      Objective:   BP 110/70   Pulse 88   Temp (!) 96.2 F (35.7 C) (Oral)   Resp 16   Wt 223 lb (101.2 kg)   LMP 04/19/2019   BMI 34.93 kg/m  Vitals:   04/25/19 1518  BP: 110/70  Pulse: 88  Resp: 16  Temp: (!) 96.2 F (35.7 C)  TempSrc: Oral  Weight: 223 lb (101.2 kg)  Body mass index is 34.93 kg/m.   Physical Exam Constitutional:      Appearance: Normal appearance. She is obese.  Cardiovascular:     Rate and Rhythm: Normal rate and regular rhythm.     Heart sounds: Normal heart sounds.  Pulmonary:     Effort: Pulmonary effort is normal.     Breath sounds: Normal breath sounds.  Abdominal:     General: Bowel sounds are normal.     Palpations: Abdomen is soft.     Tenderness: There is no abdominal tenderness.  Neurological:     Mental Status: She is alert and oriented to person, place, and time. Mental status is at baseline.  Psychiatric:        Mood and Affect: Mood normal.        Behavior: Behavior normal.      No results found for any visits on 04/25/19.     Assessment & Plan    1. Menorrhagia with regular cycle  Patient declines birth control because she says it causes headaches and makes her gain weight. Recommended IUD and patient is hesitant due to hormones. Recommended she see her OBGYN again. Referral placed for insurance purposes but patient should reach out to schedule appointment as she is an existing patient.   - Ambulatory referral to Obstetrics / Gynecology  2. Overactive bladder  Treat as below. Patient is unable to provide urine sample today.   Addendum 04/26/2019: Myrbetriq is not covered by insurance will send in toviaz 8 mg 24Hr tablet per insurance  formulary.   - mirabegron ER (MYRBETRIQ) 25 MG TB24 tablet; Take 1 tablet (25 mg total) by mouth daily.  Dispense: 90 tablet; Refill: 0  The entirety of the information documented in the History of Present Illness, Review of Systems and Physical Exam were personally obtained by me. Portions of this information were initially documented by Jennings Books, CMA and reviewed by me for thoroughness and accuracy.         Wendee Beavers  Suyash Amory, [facility]

## 2019-04-26 ENCOUNTER — Telehealth: Payer: Self-pay

## 2019-04-26 ENCOUNTER — Telehealth: Payer: Self-pay | Admitting: Obstetrics & Gynecology

## 2019-04-26 MED ORDER — TOVIAZ 8 MG PO TB24: 8.0000 mg | ORAL_TABLET | Freq: Every day | ORAL | 0 refills | Status: DC

## 2019-04-26 NOTE — Telephone Encounter (Signed)
Patient advised.

## 2019-04-26 NOTE — Addendum Note (Signed)
Addended by: Trinna Post on: 04/26/2019 04:17 PM   Modules accepted: Orders

## 2019-04-26 NOTE — Telephone Encounter (Signed)
-----   Message from Trinna Post, [place] sent at 04/26/2019  4:17 PM EST ----- Can we call patient and let her know I had to send in different medication Lisbeth Ply for overactive bladder due to her insurance preference? Thanks.

## 2019-04-26 NOTE — Telephone Encounter (Signed)
[facility] referring for Menorrhagia with regular cycle. Called and left voicemail for patient to call back to be schedule

## 2019-05-01 NOTE — Progress Notes (Addendum)
Ann Post, Ann Morales   Chief Complaint  Patient presents with  . Menorrhagia    always heavy, sometimes twice a month, severe cramping    HPI:      Ann Morales is a 44 y.o. [id number] who LMP was Patient's last menstrual period was 04/19/2019 (exact date)., presents today for menorrhagia mgmt, referred by PCP. Saw Dr. Glennon Mac last yr for pelvic pain/hx of ovar cyst on 8/20 CT that resolved by 9/20 u/s. No other GYN abn on u/s. Pt also with menorrhagia at that time, but Dr. Glennon Mac was dealing with pelvic pain/ovar cyst sx first. Menses are Q3-3/12 wks, last 7 days, heavy days changing pads Q 1 hr, with quarter sized clots and severe dysmen, slightly improved with Rx ibup 800 mg. Has had to miss activities/work due to pain. Has always had dysmen, but flow is worse now. Also with vasomotor sx.  Hx of migraines without aura and tobacco use. No HTN, DVTs. Is trying to lose wt. Did OCPs in past with wt gain and headaches. Would prefer to not have medication or hormones. She is sex active, no dyspareunia/postcoital bleeding. S/p TL. Last pap 9/20 was neg cells/neg HPV DNA with PCP.   Pt also being treated for OAB with toviaz. Has SUI as well. Mom is s/p hyst with bladder tack.   Past Medical History:  Diagnosis Date  . ADHD (attention deficit hyperactivity disorder)   . Anxiety   . Arthritis   . Asthma   . Bipolar disorder ([place])   . Depression   . GERD (gastroesophageal reflux disease)   . Headache    migraines  . History of kidney stones   . Sleep apnea    cpap    Past Surgical History:  Procedure Laterality Date  . BACK SURGERY  03/2017   cervical fusion  . CHOLECYSTECTOMY    . GALLBLADDER SURGERY    . LAPAROSCOPY N/A 12/21/2018   Procedure: LAPAROSCOPY DIAGNOSTIC;  Surgeon: Will Bonnet, MD;  Location: [facility] ORS;  Service: Gynecology;  Laterality: N/A;  . PLANTAR FASCIA RELEASE Left 06/12/2017   Procedure: ENDOSCOPIC PLANTAR FASCIOTOMY-RELEASE;  Surgeon:  Samara Deist, DPM;  Location: [facility];  Service: Podiatry;  Laterality: Left;  . REPAIR EXTENSOR TENDON Left 06/12/2017   Procedure: REPAIR FLEXOR TENDON;  Surgeon: Samara Deist, DPM;  Location: [facility];  Service: Podiatry;  Laterality: Left;  . TARSAL TUNNEL RELEASE Left 06/12/2017   Procedure: TARSAL TUNNEL RELEASE-BAXTER RELEASE;  Surgeon: Samara Deist, DPM;  Location: [facility];  Service: Podiatry;  Laterality: Left;  . TUBAL LIGATION      Family History  Problem Relation Age of Onset  . Hypercholesterolemia Father   . Bipolar disorder Mother   . Hypercholesterolemia Maternal Grandmother   . Hypertension Maternal Grandmother   . Anxiety disorder Maternal Grandmother   . Depression Maternal Grandmother   . Ovarian cancer Maternal Grandmother   . Colon cancer Maternal Grandfather 69  . Ovarian cancer Paternal Grandmother     Social History   Socioeconomic History  . Marital status: Single    Spouse name: Not on file  . Number of children: Not on file  . Years of education: Not on file  . Highest education level: Not on file  Occupational History  . Not on file  Tobacco Use  . Smoking status: Current Every Day Smoker    Packs/day: 2.00    Years: 15.00    Pack years: 30.00    Types:  Cigarettes    Start date: 09/25/1994  . Smokeless tobacco: Never Used  Substance and Sexual Activity  . Alcohol use: No    Alcohol/week: 0.0 standard drinks  . Drug use: No  . Sexual activity: Yes    Birth control/protection: None  Other Topics Concern  . Not on file  Social History Narrative  . Not on file   Social Determinants of Health   Financial Resource Strain:   . Difficulty of Paying Living Expenses: Not on file  Food Insecurity:   . Worried About Charity fundraiser in the Last Year: Not on file  . Ran Out of Food in the Last Year: Not on file  Transportation Needs:   . Lack of Transportation (Medical): Not on file  . Lack of Transportation (Non-Medical): Not on file    Physical Activity:   . Days of Exercise per Week: Not on file  . Minutes of Exercise per Session: Not on file  Stress:   . Feeling of Stress : Not on file  Social Connections:   . Frequency of Communication with Friends and Family: Not on file  . Frequency of Social Gatherings with Friends and Family: Not on file  . Attends Religious Services: Not on file  . Active Member of Clubs or Organizations: Not on file  . Attends Archivist Meetings: Not on file  . Marital Status: Not on file  Intimate Partner Violence:   . Fear of Current or Ex-Partner: Not on file  . Emotionally Abused: Not on file  . Physically Abused: Not on file  . Sexually Abused: Not on file    Outpatient Medications Prior to Visit  Medication Sig Dispense Refill  . albuterol (VENTOLIN HFA) 108 (90 Base) MCG/ACT inhaler Inhale 2 puffs into the lungs every 6 (six) hours as needed for wheezing or shortness of breath. 18 g 0  . amphetamine-dextroamphetamine (ADDERALL) 10 MG tablet Take 10 mg by mouth daily at 2 PM.  0  . buPROPion (WELLBUTRIN XL) 300 MG 24 hr tablet Take 300 mg by mouth daily.   1  . cetirizine (ZYRTEC) 10 MG tablet Take 1 tablet (10 mg total) by mouth daily. 30 tablet 11  . clonazePAM (KLONOPIN) 0.5 MG tablet Take 1 tablet (0.5 mg total) by mouth 2 (two) times daily as needed for anxiety. (Patient taking differently: Take 0.5 mg by mouth 2 (two) times daily. ) 60 tablet 3  . fesoterodine (TOVIAZ) 8 MG TB24 tablet Take 1 tablet (8 mg total) by mouth daily. 30 tablet 0  . ibuprofen (ADVIL) 800 MG tablet Take 1 tablet (800 mg total) by mouth every 8 (eight) hours as needed. 30 tablet 0  . ipratropium (ATROVENT) 0.03 % nasal spray Place 2 sprays into both nostrils 3 (three) times daily as needed for rhinitis. 30 mL 0  . LATUDA 40 MG TABS tablet Take 40 mg by mouth daily after supper.   2  . lisdexamfetamine (VYVANSE) 40 MG capsule Take 40 mg by mouth daily.     [name] Kitchen lithium 300 MG tablet Take 300 mg  by mouth 2 (two) times daily.  2  . loratadine (CLARITIN) 10 MG tablet One daily as needed for allergies (Patient taking differently: Take 10 mg by mouth daily as needed for allergies. ) 30 tablet 5  . mirabegron ER (MYRBETRIQ) 25 MG TB24 tablet Take 1 tablet (25 mg total) by mouth daily. 90 tablet 0  . neomycin-polymyxin-hydrocortisone (CORTISPORIN) OTIC solution Place 4 drops into  the right ear 4 (four) times daily. 10 mL 0  . omeprazole (PRILOSEC) 20 MG capsule Take 1 capsule (20 mg total) by mouth daily. 90 capsule 1  . SUMAtriptan (IMITREX) 50 MG tablet Take one tablet at onset of headache; may repeat in 2 hours if not better 10 tablet 0  . tobramycin-dexamethasone (TOBRADEX) ophthalmic solution 3 drops in right ear TID 5 mL 0  . triamcinolone cream (KENALOG) 0.1 % Apply 1 application topically 2 (two) times daily. 30 g 0  . TRINTELLIX 10 MG TABS Take 10 mg by mouth daily.   2  . amoxicillin-clavulanate (AUGMENTIN) 875-125 MG tablet Take 1 tablet by mouth 2 (two) times daily. 14 tablet 0  . fluconazole (DIFLUCAN) 150 MG tablet Take 1 tablet (150 mg) PO x 1 dose. May repeat 150 mg dose in 3 days if still symptomatic. 2 tablet 0  . predniSONE (DELTASONE) 20 MG tablet 2 po at sametime daily for 5 days 10 tablet 0   No facility-administered medications prior to visit.      ROS:  Review of Systems  Constitutional: Negative for fatigue, fever and unexpected weight change.  Respiratory: Negative for cough, shortness of breath and wheezing.   Cardiovascular: Negative for chest pain, palpitations and leg swelling.  Gastrointestinal: Negative for blood in stool, constipation, diarrhea, nausea and vomiting.  Endocrine: Negative for cold intolerance, heat intolerance and polyuria.  Genitourinary: Positive for menstrual problem. Negative for dyspareunia, dysuria, flank pain, frequency, genital sores, hematuria, pelvic pain, urgency, vaginal bleeding, vaginal discharge and vaginal pain.   Musculoskeletal: Negative for back pain, joint swelling and myalgias.  Skin: Negative for rash.  Neurological: Negative for dizziness, syncope, light-headedness, numbness and headaches.  Hematological: Negative for adenopathy.  Psychiatric/Behavioral: Positive for agitation and dysphoric mood. Negative for confusion, sleep disturbance and suicidal ideas. The patient is not nervous/anxious.     OBJECTIVE:   Vitals:  BP 100/80   Ht 5\' 7"  (1.702 m)   Wt 226 lb (102.5 kg)   LMP 04/19/2019 (Exact Date)   BMI 35.40 kg/m   Physical Exam Vitals reviewed.  Constitutional:      Appearance: She is well-developed.  Pulmonary:     Effort: Pulmonary effort is normal.  Genitourinary:    General: Normal vulva.     Pubic Area: No rash.      Labia:        Right: No rash, tenderness or lesion.        Left: No rash, tenderness or lesion.      Vagina: Normal. No vaginal discharge, erythema or tenderness.     Cervix: Normal.     Uterus: Normal. Not enlarged and not tender.      Adnexa: Right adnexa normal and left adnexa normal.       Right: No mass or tenderness.         Left: No mass or tenderness.    Musculoskeletal:        General: Normal range of motion.     Cervical back: Normal range of motion.  Skin:    General: Skin is warm and dry.  Neurological:     General: No focal deficit present.     Mental Status: She is alert and oriented to person, place, and time.  Psychiatric:        Mood and Affect: Mood normal.        Behavior: Behavior normal.        Thought Content: Thought content normal.  Judgment: Judgment normal.     Results: No results found for this or any previous visit (from the past 24 hour(s)). Endometrial Biopsy After discussion with the patient regarding her uterine bleeding and desire for endometrial ablation,  I recommended that she proceed with an endometrial biopsy for further diagnosis. The risks, benefits, alternatives, and indications for an  endometrial biopsy were discussed with the patient in detail. She understood the risks including infection, bleeding, cervical laceration and uterine perforation.  Verbal consent was obtained.   PROCEDURE NOTE:  Pipelle endometrial biopsy was performed using aseptic technique with iodine preparation.  The uterus was sounded to a length of 8.0 cm.  Adequate sampling was obtained with minimal blood loss.  The patient tolerated the procedure well.  Disposition will be pending pathology.  Assessment/Plan: Menometrorrhagia - Plan: POCT hemoglobin, Surgical pathology; Discussed hormonal mgmt including depo, nexplanon, IUD; as well as Lysteda, ablation, hyst. Pt would like to proceed with ablation. EMB done today, RTO with MD for procedure discussion and scheduling. Pt aware ablation may not relieve dysmen sx, but could still use Rx ibup for sx.   Mixed incontinence--cont tovia.     Return in about 1 week (around 05/09/2019) for with MD for endometrial ablation conf (EMB done).  Braedin Millhouse B. Cace Osorto, Ann Morales 05/03/2019 10:48 AM

## 2019-05-02 ENCOUNTER — Encounter: Payer: Self-pay | Admitting: Obstetrics and Gynecology

## 2019-05-02 ENCOUNTER — Ambulatory Visit (INDEPENDENT_AMBULATORY_CARE_PROVIDER_SITE_OTHER): Payer: Medicaid Other | Admitting: Obstetrics and Gynecology

## 2019-05-02 ENCOUNTER — Other Ambulatory Visit (HOSPITAL_COMMUNITY)
Admission: RE | Admit: 2019-05-02 | Discharge: 2019-05-02 | Disposition: A | Discharge status: Home / Self Care | Payer: Medicaid Other | Source: Ambulatory Visit | Attending: Obstetrics and Gynecology | Admitting: Obstetrics and Gynecology

## 2019-05-02 ENCOUNTER — Other Ambulatory Visit: Payer: Self-pay

## 2019-05-02 VITALS — BP 100/80 | Ht 67.0 in | Wt 226.0 lb

## 2019-05-02 DIAGNOSIS — N921 Excessive and frequent menstruation with irregular cycle: Secondary | ICD-10-CM | POA: Diagnosis not present

## 2019-05-02 DIAGNOSIS — N946 Dysmenorrhea, unspecified: Secondary | ICD-10-CM | POA: Diagnosis not present

## 2019-05-02 DIAGNOSIS — N3946 Mixed incontinence: Secondary | ICD-10-CM | POA: Insufficient documentation

## 2019-05-02 LAB — POCT HEMOGLOBIN: Hemoglobin: 14.4 g/dL (ref 11–14.6)

## 2019-05-02 NOTE — Patient Instructions (Signed)
I value your feedback and entrusting us with your care. If you get a [facility] patient survey, I would appreciate you taking the time to let us know about your experience today. Thank you!  As of February 17, 2019, your lab results will be released to your MyChart immediately, before I even have a chance to see them. Please give me time to review them and contact you if there are any abnormalities. Thank you for your patience.  

## 2019-05-03 ENCOUNTER — Encounter: Payer: Self-pay | Admitting: Obstetrics and Gynecology

## 2019-05-03 LAB — SURGICAL PATHOLOGY

## 2019-05-13 ENCOUNTER — Other Ambulatory Visit: Payer: Self-pay | Admitting: Physician Assistant

## 2019-05-16 ENCOUNTER — Other Ambulatory Visit: Payer: Self-pay

## 2019-05-16 ENCOUNTER — Ambulatory Visit (INDEPENDENT_AMBULATORY_CARE_PROVIDER_SITE_OTHER): Payer: Medicaid Other | Admitting: Obstetrics and Gynecology

## 2019-05-16 ENCOUNTER — Encounter: Payer: Self-pay | Admitting: Obstetrics and Gynecology

## 2019-05-16 VITALS — BP 112/67 | HR 77 | Ht 67.0 in | Wt 223.0 lb

## 2019-05-16 DIAGNOSIS — N938 Other specified abnormal uterine and vaginal bleeding: Secondary | ICD-10-CM

## 2019-05-16 DIAGNOSIS — N939 Abnormal uterine and vaginal bleeding, unspecified: Secondary | ICD-10-CM

## 2019-05-16 NOTE — Progress Notes (Signed)
Gynecology Abnormal Uterine Bleeding Initial Evaluation   Chief Complaint:  Chief Complaint  Patient presents with  . Advice Only    discuss possible ablation/referred by ABC    History of Present Illness:    Paitient is a 44 y.o. [id number] who LMP was Patient's last menstrual period was 04/19/2019 (exact date)., presents today for a problem visit.  She complains of menometrorrhagia that  began several years ago and its severity is described as moderate.  The patient menstrual complaints are chronic present for the past 6 months .  She had undergone full evaluation with TVUS 12/06/2018 normal, pap 11/25/2018 NIL HPV negative, and endometrial biopsy 05/02/2019 proliferative phase endometrium.    LMP: Patient's last menstrual period was 04/19/2019 (exact date).  Review of Systems: Review of Systems  Constitutional: Negative.   Gastrointestinal: Negative.   Genitourinary: Negative.     Past Medical History:  Patient Active Problem List   Diagnosis Date Noted  . Menometrorrhagia 05/02/2019  . Mixed incontinence 05/02/2019  . Pelvic pain in female 12/21/2018  . Abdominal pain, right lower quadrant 12/21/2018  . Fusion of spine of cervical region 03/13/2017  . Cervical disc disorder with radiculopathy 02/25/2017  . Lymphedema 04/24/2016  . Varicose veins of bilateral lower extremities with pain 04/08/2016  . Chronic venous insufficiency 04/08/2016  . OSA (obstructive sleep apnea) 04/04/2016    Sleep study 07/12/15 at [facility]: CPAP @ 12 cm/H20   . Adult ADHD 01/18/2016  . Bipolar I disorder, current or most recent episode depressed, with psychotic features ([place]) 01/18/2016  . Other specified anxiety disorders 01/18/2016  . Allergic rhinitis 01/15/2015  . Current smoker 01/15/2015  . H/O manic depressive disorder 01/15/2015  . H/O renal calculi 01/15/2015  . Asthma 01/15/2015  . HLD (hyperlipidemia) 10/25/2013    Past Surgical History:  Past Surgical History:    Procedure Laterality Date  . BACK SURGERY  03/2017   cervical fusion  . CHOLECYSTECTOMY    . GALLBLADDER SURGERY    . LAPAROSCOPY N/A 12/21/2018   Procedure: LAPAROSCOPY DIAGNOSTIC;  Surgeon: Will Bonnet, MD;  Location: [facility] ORS;  Service: Gynecology;  Laterality: N/A;  . PLANTAR FASCIA RELEASE Left 06/12/2017   Procedure: ENDOSCOPIC PLANTAR FASCIOTOMY-RELEASE;  Surgeon: Samara Deist, DPM;  Location: [facility];  Service: Podiatry;  Laterality: Left;  . REPAIR EXTENSOR TENDON Left 06/12/2017   Procedure: REPAIR FLEXOR TENDON;  Surgeon: Samara Deist, DPM;  Location: [facility] OR[facility];  Service: Podiatry;  Laterality: Left;  . TARSAL TUNNEL RELEASE Left 06/12/2017   Procedure: TARSAL TUNNEL RELEASE-BAXTER RELEASE;  Surgeon: Samara Deist, DPM;  Location: [facility] OR[facility];  Service: Podiatry;  Laterality: Left;  . TUBAL LIGATION      Obstetric History: [id number]  Family History:  Family History  Problem Relation Age of Onset  . Hypercholesterolemia Father   . Bipolar disorder Mother   . Hypercholesterolemia Maternal Grandmother   . Hypertension Maternal Grandmother   . Anxiety disorder Maternal Grandmother   . Depression Maternal Grandmother   . Ovarian cancer Maternal Grandmother   . Colon cancer Maternal Grandfather 46  . Ovarian cancer Paternal Grandmother     Social History:  Social History   Socioeconomic History  . Marital status: Single    Spouse name: Not on file  . Number of children: Not on file  . Years of education: Not on file  . Highest education level: Not on file  Occupational History  . Not on file  Tobacco Use  .  Smoking status: Current Every Day Smoker    Packs/day: 2.00    Years: 15.00    Pack years: 30.00    Types: Cigarettes    Start date: 09/25/1994  . Smokeless tobacco: Never Used  Substance and Sexual Activity  . Alcohol use: No    Alcohol/week: 0.0 standard drinks  . Drug use: No  . Sexual activity: Yes    Birth control/protection: Surgical  Other  Topics Concern  . Not on file  Social History Narrative  . Not on file   Social Determinants of Health   Financial Resource Strain:   . Difficulty of Paying Living Expenses: Not on file  Food Insecurity:   . Worried About Charity fundraiser in the Last Year: Not on file  . Ran Out of Food in the Last Year: Not on file  Transportation Needs:   . Lack of Transportation (Medical): Not on file  . Lack of Transportation (Non-Medical): Not on file  Physical Activity:   . Days of Exercise per Week: Not on file  . Minutes of Exercise per Session: Not on file  Stress:   . Feeling of Stress : Not on file  Social Connections:   . Frequency of Communication with Friends and Family: Not on file  . Frequency of Social Gatherings with Friends and Family: Not on file  . Attends Religious Services: Not on file  . Active Member of Clubs or Organizations: Not on file  . Attends Archivist Meetings: Not on file  . Marital Status: Not on file  Intimate Partner Violence:   . Fear of Current or Ex-Partner: Not on file  . Emotionally Abused: Not on file  . Physically Abused: Not on file  . Sexually Abused: Not on file    Allergies:  Allergies  Allergen Reactions  . Codeine Hives and Shortness Of Breath  . Azithromycin Diarrhea  . Sulfa Antibiotics Other (See Comments)    Severe abdominal pain.    Medications: Prior to Admission medications   Medication Sig Start Date End Date Taking? Authorizing Provider  albuterol (VENTOLIN HFA) 108 (90 Base) MCG/ACT inhaler Inhale 2 puffs into the lungs every 6 (six) hours as needed for wheezing or shortness of breath. 03/16/19   Hassell Done, Mary-Margaret, FNP  amphetamine-dextroamphetamine (ADDERALL) 10 MG tablet Take 10 mg by mouth daily at 2 PM. 05/04/17   [provider]  buPROPion (WELLBUTRIN XL) 300 MG 24 hr tablet Take 300 mg by mouth daily.  04/16/16   [provider]  cetirizine (ZYRTEC) 10 MG tablet Take 1 tablet (10 mg total)  by mouth daily. 04/12/19   Nils Flack, Mina A, PA-C  clonazePAM (KLONOPIN) 0.5 MG tablet Take 1 tablet (0.5 mg total) by mouth 2 (two) times daily as needed for anxiety. Patient taking differently: Take 0.5 mg by mouth 2 (two) times daily.  04/16/15   Marjie Skiff, MD  fesoterodine (TOVIAZ) 8 MG TB24 tablet Take 1 tablet (8 mg total) by mouth daily. 04/26/19   Trinna Post, PA-C  ibuprofen (ADVIL) 800 MG tablet Take 1 tablet (800 mg total) by mouth every 8 (eight) hours as needed. 03/15/19   Trinna Post, PA-C  ipratropium (ATROVENT) 0.03 % nasal spray Place 2 sprays into both nostrils 3 (three) times daily as needed for rhinitis. 04/12/19   Fawze, Mina A, PA-C  LATUDA 40 MG TABS tablet Take 40 mg by mouth daily after supper.  02/06/17   [provider]  lisdexamfetamine Alycia Patten)  40 MG capsule Take 40 mg by mouth daily.     [provider]  lithium 300 MG tablet Take 300 mg by mouth 2 (two) times daily. 05/04/17   [provider]  loratadine (CLARITIN) 10 MG tablet One daily as needed for allergies Patient taking differently: Take 10 mg by mouth daily as needed for allergies.  11/20/17   Carmon Ginsberg, PA  mirabegron ER (MYRBETRIQ) 25 MG TB24 tablet Take 1 tablet (25 mg total) by mouth daily. 04/25/19   Trinna Post, PA-C  neomycin-polymyxin-hydrocortisone (CORTISPORIN) OTIC solution Place 4 drops into the right ear 4 (four) times daily. 04/20/19   Hassell Done, Mary-Margaret, FNP  omeprazole (PRILOSEC) 20 MG capsule Take 1 capsule (20 mg total) by mouth daily. 02/28/19   Trinna Post, PA-C  SUMAtriptan (IMITREX) 50 MG tablet Take one tablet at onset of headache; may repeat in 2 hours if not better 03/15/19   Trinna Post, PA-C  tobramycin-dexamethasone [facility]) ophthalmic solution 3 drops in right ear TID 04/19/19   Chevis Pretty, FNP  triamcinolone cream (KENALOG) 0.1 % Apply 1 application topically 2 (two) times daily. 03/15/19   [name] M, PA-C    TRINTELLIX 10 MG TABS Take 10 mg by mouth daily.  08/25/16   [provider]    Physical Exam Blood pressure 112/67, pulse 77, height 5\' 7"  (1.702 m), weight 223 lb (101.2 kg), last menstrual period 04/19/2019.  Patient's last menstrual period was 04/19/2019 (exact date).  General: NAD, well nourished, appears stated age 4: normocephalic, anicteric Pulmonary: No increased work of breathing Neurologic: Grossly intact Psychiatric: mood appropriate, affect full  Female chaperone present for pelvic portions of the physical exam  Recent Results (from the past 2160 hour(s))  Surgical pathology     Status: None   Collection Time: 05/02/19 10:12 AM  Result Value Ref Range   SURGICAL PATHOLOGY      SURGICAL PATHOLOGY CASE: [id number] PATIENT: Areatha Keas Surgical Pathology Report     Clinical History: menometrorrhagia (cm)    FINAL MICROSCOPIC DIAGNOSIS:  A. ENDOMETRIUM, BIOPSY: -  Proliferative endometrium -  No hyperplasia or malignancy identified   GROSS DESCRIPTION:  The specimen is received in formalin and consists of a 2.2 x 1.7 x 0.4 cm aggregate of tan-red soft tissue and mucus.  Specimen is entirely submitted in 1 cassette. Craig Staggers 05/03/2019)    Final Diagnosis performed by Thressa Sheller, MD.   Electronically signed 05/03/2019 Technical and / or Professional components performed at [facility]. [facility], [place] [address] [place], [place] [address].  Immunohistochemistry Technical component (if applicable) was performed at [facility]. [address] [place], [place], [place] [address].   IMMUNOHISTOCHEMISTRY DISCLAIMER (if applicable): Some of these immunohistochemical stains may have been developed and the perfo rmance characteristics determine by [facility]. Some may not have been cleared or approved by the U.S. Food and Drug Administration. The FDA has determined that such clearance or approval is not  necessary. This test is used for clinical purposes. It should not be regarded as investigational or for research. This laboratory is certified under the [facility] (CLIA-88) as qualified to perform high complexity clinical laboratory testing.  The controls stained appropriately.   POCT hemoglobin     Status: Normal   Collection Time: 05/02/19 10:13 AM  Result Value Ref Range   Hemoglobin 14.4 11 - 14.6 g/dL   No results found.  Assessment: 44 y.o. [id number] with  abnormal uterine bleeding  Plan: Problem List Items Addressed This Visit    None    Visit Diagnoses    Abnormal uterine bleeding    -  Primary      1) AUB - Discussed management options for abnormal uterine bleeding including expectant, NSAIDs, tranexamic acid (Lysteda), oral progesterone (Provera, norethindrone, megace), Depo Provera, Levonorgestrel containing IUD, endometrial ablation (Novasure) or hysterectomy as definitive surgical management.  Patient interested in endometrial ablation.  She has undergone prior BTL.  The patient was counseled on the overall effectiveness of endometrial ablation in achieving amenorrhea.  She is aware that some patient may continue to have menstrual cycles although these are generally greatly reduced in flow.  In addition she was quoted a failure rate for endometrial ablation of approximately 25% within the frist 4 years, but these failures may happen at any time during or after the initial 4 year postop period.  She is aware that pregnancy is contra-indicated in the setting of prior endometrial ablation, and that ablation itself does not confer any contraceptive benefit.  She will therefore need to continue to rely on some means of contraception following the procedure.  Although rare and generally confined to patient who have undergone prior tubal ligatoin, post-ablation tubal sterilizaton syndrome (PATSS) may also occur with no reliable incidence rates as  the majority of published literature is limited to case reports.   Prior to being considered a candidate for Novasure ablation she will need to undergo endometrial biopsy to rule out endometrial hyperplasia or malignancy as the cause of her bleeding, have an up to date pap on record, and undergo transvaginal ultrasound to verify the absence of focal endometrial lesion which may need to be addressed prior to proceeding with ablation.  In addition she is aware that the device is limited for use in women with a normal uterine cavity and uterine leiomyomata <3cm in size.  If present leiomyomata may increase the long-term failure rate of the procedure.  In rare instances the presence of a uterine septum or arcuate uterus, which may not be readily apparent on preoperative ultrasound, may necessitate the procedure to be aborted.     2) Return if symptoms worsen or fail to improve.   Malachy Mood, MD, [facility], [place]

## 2019-05-24 ENCOUNTER — Telehealth: Payer: Self-pay | Admitting: Obstetrics and Gynecology

## 2019-05-24 NOTE — Telephone Encounter (Signed)
Called pt to offer surgery dates.  DOS 3/25 w Dr Georgianne Fick  H&P will be done on DOS - ok per Dr Georgianne Fick  Covid 3/23, 8-10:30 - [place], drive up and wear mask. Adv pt to quar after test until DOS.  Adv that she will also have a Preadmit phone appt and the date/time will be listed on her MyChart (active user).  Ver Medicaid as prim/only insurance

## 2019-05-24 NOTE — Telephone Encounter (Signed)
-----   Message from Malachy Mood, MD sent at 05/17/2019  1:28 PM EST ----- Regarding: Surgery Surgery Booking Request Patient Full Name:  Ann Morales  MRN: [id number]  DOB: [birth year]  Surgeon: Malachy Mood, MD  Requested Surgery Date and Time: 2-8 weeks Primary Diagnosis AND Code: AUB N93.9 Secondary Diagnosis and Code:  Surgical Procedure: Hysteroscopy D&C with Ablation L&D Notification: No Admission Status: same day surgery Length of Surgery: 1hr Special Case Needs: No H&P: Yes Phone Interview???:  No Interpreter: No Language:  Medical Clearance:  No Special Scheduling Instructions:  Any known health/anesthesia issues, diabetes, sleep apnea, latex allergy, defibrillator/pacemaker?: No Acuity: P3   (P1 highest, P2 delay may cause harm, P3 low, elective gyn, P4 lowest)

## 2019-05-30 ENCOUNTER — Encounter
Admission: RE | Admit: 2019-05-30 | Discharge: 2019-05-30 | Disposition: A | Discharge status: Home / Self Care | Payer: Medicaid Other | Source: Ambulatory Visit | Attending: Obstetrics and Gynecology | Admitting: Obstetrics and Gynecology

## 2019-05-30 ENCOUNTER — Other Ambulatory Visit: Payer: Self-pay

## 2019-05-30 NOTE — Patient Instructions (Addendum)
Your procedure is scheduled on: 05-31-19 THURSDAY Report to Same Day Surgery 2nd floor medical mall [facility] Entrance-take elevator on left to 2nd floor.  Check in with surgery information desk.) To find out your arrival time please call [phone] between 1PM - 3PM on 05-30-19 [facility]  Remember: Instructions that are not followed completely may result in serious medical risk, up to and including death, or upon the discretion of your surgeon and anesthesiologist your surgery may need to be rescheduled.    _x___ 1. Do not eat food after midnight the night before your procedure. NO GUM OR CANDY AFTER MIDNIGHT. You may drink clear liquids up to 2 hours before you are scheduled to arrive at the hospital for your procedure.  Do not drink clear liquids within 2 hours of your scheduled arrival to the hospital.  Clear liquids include  --Water or Apple juice without pulp  --Clear carbohydrate beverage such as ClearFast or Gatorade  --Black Coffee or Clear Tea (No milk, no creamers, do not add anything to the coffee or Tea   ____Ensure clear carbohydrate drink on the way to the hospital for bariatric patients  _X___Ensure clear carbohydrate drink-FINISH DRINK 2 HOURS PRIOR TO Ann Morales    __x__ 2. No Alcohol for 24 hours before or after surgery.   __x__3. No Smoking or e-cigarettes for 24 prior to surgery.  Do not use any chewable tobacco products for at least 6 hour prior to surgery   ____  4. Bring all medications with you on the day of surgery if instructed.    __x__ 5. Notify your doctor if there is any change in your medical condition     (cold, fever, infections).    x___6. On the morning of surgery brush your teeth with toothpaste and water.  You may rinse your mouth with mouth wash if you wish.  Do not swallow any toothpaste or mouthwash.   Do not wear jewelry, make-up, hairpins, clips or nail polish.  Do not wear lotions, powders, or perfumes. You may wear  deodorant.  Do not shave 48 hours prior to surgery. Men may shave face and neck.  Do not bring valuables to the hospital.    [facility] is not responsible for any belongings or valuables.               Contacts, dentures or bridgework may not be worn into surgery.  Leave your suitcase in the car. After surgery it may be brought to your room.  For patients admitted to the hospital, discharge time is determined by your treatment team.  _  Patients discharged the day of surgery will not be allowed to drive home.  You will need someone to drive you home and stay with you the night of your procedure.    Please read over the following fact sheets that you were given:   [facility] Preparing for Surgery   _x___ TAKE THE FOLLOWING MEDICATION THE MORNING OF SURGERY WITH A SMALL SIP OF WATER. These include:  1. WELLBUTRIN (BUPROPION)  2. KLONOPIN (CLONAZEPAM)  3.TOVIAZ (FESOTERODINE)  4. LITHIUM   5.TRINTELLIX  6. PRILOSEC (OMEPRAZOLE)  7. TAKE AN EXTRA PRILOSEC THE NIGHT BEFORE YOUR SURGERY  ____Fleets enema or Magnesium Citrate as directed.   ____ Use CHG Soap or sage wipes as directed on instruction sheet   _X___ Use inhalers on the day of surgery and bring to hospital day of surgery-USE YOUR ALBUTEROL INHALER DAY OF SURGERY AND BRING INHALER  TO HOSPITAL  ____ Stop Metformin and Janumet 2 days prior to surgery.    ____ Take 1/2 of usual insulin dose the night before surgery and none on the morning surgery.   ____ Follow recommendations from Cardiologist, Pulmonologist or PCP regarding stopping Aspirin, Coumadin, Plavix ,Eliquis, Effient, or Pradaxa, and Pletal.  X____Stop Anti-inflammatories such as Advil, Aleve, Ibuprofen, Motrin, Naproxen, Naprosyn, Goodies powders or aspirin products NOW- OK to take Tylenol    ____ Stop supplements until after surgery.

## 2019-05-31 ENCOUNTER — Other Ambulatory Visit
Admission: RE | Admit: 2019-05-31 | Discharge: 2019-05-31 | Disposition: A | Discharge status: Home / Self Care | Payer: Medicaid Other | Source: Ambulatory Visit | Attending: Obstetrics and Gynecology | Admitting: Obstetrics and Gynecology

## 2019-05-31 DIAGNOSIS — Z20822 Contact with and (suspected) exposure to covid-19: Secondary | ICD-10-CM | POA: Insufficient documentation

## 2019-05-31 DIAGNOSIS — Z01812 Encounter for preprocedural laboratory examination: Secondary | ICD-10-CM | POA: Insufficient documentation

## 2019-05-31 LAB — CBC
HCT: 42 % (ref 36.0–46.0)
Hemoglobin: 13.3 g/dL (ref 12.0–15.0)
MCH: 27.9 pg (ref 26.0–34.0)
MCHC: 31.7 g/dL (ref 30.0–36.0)
MCV: 88.1 fL (ref 80.0–100.0)
Platelets: 250 10*3/uL (ref 150–400)
RBC: 4.77 MIL/uL (ref 3.87–5.11)
RDW: 13 % (ref 11.5–15.5)
WBC: 9 10*3/uL (ref 4.0–10.5)
nRBC: 0 % (ref 0.0–0.2)

## 2019-05-31 LAB — SARS CORONAVIRUS 2 (TAT 6-24 HRS): SARS Coronavirus 2: NEGATIVE

## 2019-05-31 LAB — TYPE AND SCREEN
ABO/RH(D): B POS
Antibody Screen: NEGATIVE

## 2019-06-02 ENCOUNTER — Encounter
Admission: RE | Disposition: A | Discharge status: Home / Self Care | Payer: Self-pay | Source: Home / Self Care | Attending: Obstetrics and Gynecology

## 2019-06-02 ENCOUNTER — Encounter: Payer: Self-pay | Admitting: Obstetrics and Gynecology

## 2019-06-02 ENCOUNTER — Ambulatory Visit: Payer: Medicaid Other | Admitting: Certified Registered Nurse Anesthetist

## 2019-06-02 ENCOUNTER — Ambulatory Visit
Admission: RE | Admit: 2019-06-02 | Discharge: 2019-06-02 | Disposition: A | Discharge status: Home / Self Care | Payer: Medicaid Other | Attending: Obstetrics and Gynecology | Admitting: Obstetrics and Gynecology

## 2019-06-02 ENCOUNTER — Other Ambulatory Visit: Payer: Self-pay

## 2019-06-02 DIAGNOSIS — F419 Anxiety disorder, unspecified: Secondary | ICD-10-CM | POA: Diagnosis not present

## 2019-06-02 DIAGNOSIS — K219 Gastro-esophageal reflux disease without esophagitis: Secondary | ICD-10-CM | POA: Insufficient documentation

## 2019-06-02 DIAGNOSIS — Z79899 Other long term (current) drug therapy: Secondary | ICD-10-CM | POA: Diagnosis not present

## 2019-06-02 DIAGNOSIS — F329 Major depressive disorder, single episode, unspecified: Secondary | ICD-10-CM | POA: Insufficient documentation

## 2019-06-02 DIAGNOSIS — N3946 Mixed incontinence: Secondary | ICD-10-CM | POA: Diagnosis not present

## 2019-06-02 DIAGNOSIS — G4733 Obstructive sleep apnea (adult) (pediatric): Secondary | ICD-10-CM | POA: Diagnosis not present

## 2019-06-02 DIAGNOSIS — N939 Abnormal uterine and vaginal bleeding, unspecified: Secondary | ICD-10-CM | POA: Insufficient documentation

## 2019-06-02 DIAGNOSIS — N938 Other specified abnormal uterine and vaginal bleeding: Secondary | ICD-10-CM

## 2019-06-02 DIAGNOSIS — F17210 Nicotine dependence, cigarettes, uncomplicated: Secondary | ICD-10-CM | POA: Insufficient documentation

## 2019-06-02 DIAGNOSIS — R102 Pelvic and perineal pain: Secondary | ICD-10-CM

## 2019-06-02 DIAGNOSIS — F909 Attention-deficit hyperactivity disorder, unspecified type: Secondary | ICD-10-CM | POA: Diagnosis not present

## 2019-06-02 HISTORY — PX: HYSTEROSCOPY WITH NOVASURE: SHX5574

## 2019-06-02 LAB — POCT PREGNANCY, URINE: Preg Test, Ur: NEGATIVE

## 2019-06-02 SURGERY — HYSTEROSCOPY WITH NOVASURE
Anesthesia: General

## 2019-06-02 MED ORDER — PHENYLEPHRINE HCL (PRESSORS) 10 MG/ML IV SOLN
INTRAVENOUS | Status: AC
  Filled 2019-06-02: qty 1

## 2019-06-02 MED ORDER — MIDAZOLAM HCL 2 MG/2ML IJ SOLN
INTRAMUSCULAR | Status: DC | PRN
  Administered 2019-06-02: 2 mg via INTRAVENOUS

## 2019-06-02 MED ORDER — DIPHENHYDRAMINE HCL 50 MG/ML IJ SOLN
INTRAMUSCULAR | Status: AC
  Filled 2019-06-02: qty 1

## 2019-06-02 MED ORDER — KETOROLAC TROMETHAMINE 30 MG/ML IJ SOLN
INTRAMUSCULAR | Status: DC | PRN
  Administered 2019-06-02: 30 mg via INTRAVENOUS

## 2019-06-02 MED ORDER — SILVER NITRATE-POT NITRATE 75-25 % EX MISC
CUTANEOUS | Status: DC | PRN
  Administered 2019-06-02: 2 via TOPICAL

## 2019-06-02 MED ORDER — DEXAMETHASONE SODIUM PHOSPHATE 10 MG/ML IJ SOLN
INTRAMUSCULAR | Status: DC | PRN
  Administered 2019-06-02: 10 mg via INTRAVENOUS

## 2019-06-02 MED ORDER — ONDANSETRON HCL 4 MG/2ML IJ SOLN: 4.0000 mg | Freq: Once | INTRAMUSCULAR | Status: DC | PRN

## 2019-06-02 MED ORDER — FENTANYL CITRATE (PF) 100 MCG/2ML IJ SOLN
INTRAMUSCULAR | Status: AC
  Administered 2019-06-02: 11:00:00 25 ug via INTRAVENOUS
  Filled 2019-06-02: qty 2

## 2019-06-02 MED ORDER — DEXAMETHASONE SODIUM PHOSPHATE 10 MG/ML IJ SOLN
INTRAMUSCULAR | Status: AC
  Filled 2019-06-02: qty 1

## 2019-06-02 MED ORDER — HYDROCODONE-ACETAMINOPHEN 5-325 MG PO TABS
1.0000 | ORAL_TABLET | Freq: Once | ORAL | Status: AC
  Administered 2019-06-02: 1 via ORAL

## 2019-06-02 MED ORDER — DIPHENHYDRAMINE HCL 50 MG/ML IJ SOLN
12.5000 mg | Freq: Once | INTRAMUSCULAR | Status: AC
  Administered 2019-06-02: 12.5 mg via INTRAVENOUS

## 2019-06-02 MED ORDER — HYDROCODONE-ACETAMINOPHEN 5-325 MG PO TABS
ORAL_TABLET | ORAL | Status: AC
  Filled 2019-06-02: qty 1

## 2019-06-02 MED ORDER — LACTATED RINGERS IV SOLN: INTRAVENOUS | Status: DC

## 2019-06-02 MED ORDER — SUCCINYLCHOLINE CHLORIDE 20 MG/ML IJ SOLN
INTRAMUSCULAR | Status: DC | PRN
  Administered 2019-06-02: 110 mg via INTRAVENOUS

## 2019-06-02 MED ORDER — FENTANYL CITRATE (PF) 100 MCG/2ML IJ SOLN
25.0000 ug | INTRAMUSCULAR | Status: DC | PRN
  Administered 2019-06-02 (×5): 25 ug via INTRAVENOUS

## 2019-06-02 MED ORDER — MIDAZOLAM HCL 2 MG/2ML IJ SOLN
INTRAMUSCULAR | Status: AC
  Filled 2019-06-02: qty 2

## 2019-06-02 MED ORDER — PHENYLEPHRINE HCL (PRESSORS) 10 MG/ML IV SOLN
INTRAVENOUS | Status: DC | PRN
  Administered 2019-06-02: 100 ug via INTRAVENOUS

## 2019-06-02 MED ORDER — ROCURONIUM BROMIDE 100 MG/10ML IV SOLN
INTRAVENOUS | Status: DC | PRN
  Administered 2019-06-02: 5 mg via INTRAVENOUS

## 2019-06-02 MED ORDER — LIDOCAINE HCL (CARDIAC) PF 100 MG/5ML IV SOSY
PREFILLED_SYRINGE | INTRAVENOUS | Status: DC | PRN
  Administered 2019-06-02: 100 mg via INTRAVENOUS

## 2019-06-02 MED ORDER — IBUPROFEN 800 MG PO TABS: 800.0000 mg | ORAL_TABLET | Freq: Three times a day (TID) | ORAL | 0 refills | Status: DC | PRN

## 2019-06-02 MED ORDER — HYDROCODONE-ACETAMINOPHEN 5-325 MG PO TABS: 1.0000 | ORAL_TABLET | ORAL | 0 refills | Status: DC | PRN

## 2019-06-02 MED ORDER — ONDANSETRON HCL 4 MG/2ML IJ SOLN
INTRAMUSCULAR | Status: DC | PRN
  Administered 2019-06-02: 4 mg via INTRAVENOUS

## 2019-06-02 MED ORDER — FENTANYL CITRATE (PF) 100 MCG/2ML IJ SOLN
INTRAMUSCULAR | Status: DC | PRN
  Administered 2019-06-02: 100 ug via INTRAVENOUS

## 2019-06-02 MED ORDER — FENTANYL CITRATE (PF) 100 MCG/2ML IJ SOLN
INTRAMUSCULAR | Status: AC
  Filled 2019-06-02: qty 2

## 2019-06-02 MED ORDER — FENTANYL CITRATE (PF) 100 MCG/2ML IJ SOLN
INTRAMUSCULAR | Status: AC
  Administered 2019-06-02: 25 ug via INTRAVENOUS
  Filled 2019-06-02: qty 2

## 2019-06-02 MED ORDER — PROPOFOL 10 MG/ML IV BOLUS
INTRAVENOUS | Status: AC
  Filled 2019-06-02: qty 20

## 2019-06-02 MED ORDER — ONDANSETRON HCL 4 MG/2ML IJ SOLN
INTRAMUSCULAR | Status: AC
  Filled 2019-06-02: qty 2

## 2019-06-02 MED ORDER — PROPOFOL 10 MG/ML IV BOLUS
INTRAVENOUS | Status: DC | PRN
  Administered 2019-06-02: 180 mg via INTRAVENOUS

## 2019-06-02 MED ORDER — LIDOCAINE HCL (PF) 2 % IJ SOLN
INTRAMUSCULAR | Status: AC
  Filled 2019-06-02: qty 10

## 2019-06-02 MED ORDER — EPHEDRINE SULFATE 50 MG/ML IJ SOLN
INTRAMUSCULAR | Status: DC | PRN
  Administered 2019-06-02 (×2): 10 mg via INTRAVENOUS

## 2019-06-02 SURGICAL SUPPLY — 23 items
ABLATOR SURESOUND NOVASURE (ABLATOR) ×2 IMPLANT
CATH ROBINSON RED A/P 16FR (CATHETERS) ×3 IMPLANT
COVER WAND RF STERILE (DRAPES) IMPLANT
DEVICE MYOSURE LITE (MISCELLANEOUS) IMPLANT
ELECT REM PT RETURN 9FT ADLT (ELECTROSURGICAL)
ELECTRODE REM PT RTRN 9FT ADLT (ELECTROSURGICAL) IMPLANT
GLOVE BIO SURGEON STRL SZ7 (GLOVE) ×9 IMPLANT
GLOVE INDICATOR 7.5 STRL GRN (GLOVE) ×9 IMPLANT
GOWN STRL REUS W/ TWL LRG LVL3 (GOWN DISPOSABLE) ×2 IMPLANT
GOWN STRL REUS W/TWL LRG LVL3 (GOWN DISPOSABLE) ×4
IV LACTATED RINGER IRRG 3000ML (IV SOLUTION)
IV LR IRRIG 3000ML ARTHROMATIC (IV SOLUTION) IMPLANT
KIT PROCEDURE FLUENT (KITS) ×3 IMPLANT
KIT TURNOVER CYSTO (KITS) ×3 IMPLANT
MYOSURE XL FIBROID (MISCELLANEOUS)
PACK DNC HYST (MISCELLANEOUS) ×3 IMPLANT
PAD OB MATERNITY 4.3X12.25 (PERSONAL CARE ITEMS) ×3 IMPLANT
PAD PREP 24X41 OB/GYN DISP (PERSONAL CARE ITEMS) ×3 IMPLANT
SEAL ROD LENS SCOPE MYOSURE (ABLATOR) ×3 IMPLANT
SYSTEM TISS REMOVAL MYOSURE XL (MISCELLANEOUS) IMPLANT
TOWEL OR 17X26 4PK STRL BLUE (TOWEL DISPOSABLE) ×3 IMPLANT
TUBING CONNECTING 10 (TUBING) ×2 IMPLANT
TUBING CONNECTING 10' (TUBING) ×1

## 2019-06-02 NOTE — Anesthesia Postprocedure Evaluation (Signed)
Anesthesia Post Note  Patient: Ann Morales  Procedure(s) Performed: DILATATION & HYSTEROSCOPY WITH NOVASURE ABLATION (N/A )  Patient location during evaluation: PACU Anesthesia Type: General Level of consciousness: awake and alert Pain management: pain level controlled Vital Signs Assessment: post-procedure vital signs reviewed and stable Respiratory status: spontaneous breathing and respiratory function stable Cardiovascular status: stable Anesthetic complications: no     Last Vitals:  Vitals:   06/02/19 1107 06/02/19 1112  BP:    Pulse: 82 77  Resp: 18 18  Temp:    SpO2: 98% 93%    Last Pain:  Vitals:   06/02/19 1112  TempSrc:   PainSc: Asleep                 Wrenn Willcox K

## 2019-06-02 NOTE — Anesthesia Preprocedure Evaluation (Addendum)
Anesthesia Evaluation  Patient identified by MRN, date of birth, ID band Patient awake    Reviewed: Allergy & Precautions, NPO status , Patient's Chart, lab work & pertinent test results  History of Anesthesia Complications Negative for: history of anesthetic complications  Airway Mallampati: III       Dental   Pulmonary asthma , sleep apnea and Continuous Positive Airway Pressure Ventilation , Current Smoker and Patient abstained from smoking.,           Cardiovascular (-) hypertension(-) Past MI and (-) CHF (-) dysrhythmias (-) Valvular Problems/Murmurs     Neuro/Psych neg Seizures Anxiety Depression Bipolar Disorder    GI/Hepatic Neg liver ROS, GERD  Medicated and Poorly Controlled,  Endo/Other  neg diabetes  Renal/GU negative Renal ROS     Musculoskeletal   Abdominal   Peds  Hematology   Anesthesia Other Findings   Reproductive/Obstetrics                            Anesthesia Physical Anesthesia Plan  ASA: III  Anesthesia Plan: General   Post-op Pain Management:    Induction: Intravenous  PONV Risk Score and Plan: 2 and Ondansetron and Dexamethasone  Airway Management Planned: Oral ETT  Additional Equipment:   Intra-op Plan:   Post-operative Plan:   Informed Consent: I have reviewed the patients History and Physical, chart, labs and discussed the procedure including the risks, benefits and alternatives for the proposed anesthesia with the patient or authorized representative who has indicated his/her understanding and acceptance.       Plan Discussed with:   Anesthesia Plan Comments:         Anesthesia Quick Evaluation

## 2019-06-02 NOTE — Op Note (Signed)
Preoperative Diagnosis: 1) 44 y.o.  abnormal uterine bleeding Postoperative Diagnosis: 1) 44 y.o. abnormal uterine bleeding  Operation Performed: Hysteroscopy, novasure endometrial ablation  Indication: The patient was previously counseled on the overall effectiveness of the device in achieving amenorrhea.  She is aware that some patient may continue to have menstrual cycles although these are generally greatly reduced in flow.  In addition she was quoted a failure rate for endometrial ablation of approximately 25% within the frist 4 years, but these failures may happen at any time during or after the initial 4 year postop period.  She is aware that pregnancy is contra-indicated in the setting of prior endometrial ablation, and that ablation itself does not confer and contraceptive benefits and that she will need to continue to rely on some means of contraception following the procedure.  Although rare and generally confined to patient who have undergone prior tubal ligatoin, post-ablation tubal sterilizaton syndrome (PATSS) may also occur.   Surgeon: Malachy Mood, MD  Anesthesia: General  Preoperative Antibiotics: none  Estimated Blood Loss: 5 mL  IV Fluids: 1L crystalloid   Urine Output:: 28mL  Drains or Tubes: none  Implants: none  Specimens Removed: none  Complications: none  Intraoperative Findings: Normal appearing vagina mucosa and cervix.  Normal uterine cavity.  Good coverage of the uterine cavity on post-tablation hysteroscopy with ablation of the uterine cornua and sparing of the endocervix.   Patient Condition: stable  PROCEDURE IN DETAIL: After informed consent was obtained, the patient was taken to the operating room where anesthesia was obtained without difficulty. The patient was positioned in the dorsal lithotomy position using Allen stirrups. The patient's bladder was decompressed using a red rubber catheter. The patient was examined under anesthesia, with the  above noted findings.  An operative speculum was placed inside the patient's vagina, the cervix was adequately visualized, and the the anterior lip of the cervix was grasped with a single tooth tenaculum. The uterine cavity was sounded to 8.5 cm, and then the cervix was progressively dilated to  22mm using Pratt dilators. The 0 degree hysteroscope was introduced through the cervix and advanced under direct visualization in the uterine cavity.  Normal saline was used as the distending fluid during the hysteroscopy.  This revealed the above noted intracavitary findings. The cervical length was obtained by retracting the hysteroscope to the level of the internal cervical os and marking that point on the hysteroscopy.  This measurement yielded a value of 4.5cm, yielding a total cavity length of 4cm based on the previously obtained sounding measurement  The hystersocope was removed.  The NovaSure device was then placed without difficulty. The Novasure array was deployed and seated, with a resulting uterine width of 3.5cm. The NovaSure device passed the cavity assessment test.  Following this the device was activaed at a power of 77 with a total ablation time of 120 seconds. The NovaSure device was removed and repeat hysteroscopy reveals an appropriate lining of the uterus and no perforation or injury. Hysteroscope is removed with minimal discrepancy of fluid.   Tenaculum was removed with excellent hemostasis noted after application of silver nitrate to the tenaculum entry sites. She was then taken out of dorsal lithotomy. Hemostasis noted.  The patient tolerated the procedure well. Sponge, lap and needle counts were correct times two. The patient was taken to recovery room in excellent condition.

## 2019-06-02 NOTE — Discharge Instructions (Signed)

## 2019-06-02 NOTE — Transfer of Care (Signed)
Immediate Anesthesia Transfer of Care Note  Patient: Ann Morales  Procedure(s) Performed: DILATATION & HYSTEROSCOPY WITH NOVASURE ABLATION (N/A )  Patient Location: PACU  Anesthesia Type:General  Level of Consciousness: awake, alert  and oriented  Airway & Oxygen Therapy: Patient Spontanous Breathing and Patient connected to face mask oxygen  Post-op Assessment: Report given to RN and Post -op Vital signs reviewed and stable  Post vital signs: Reviewed and stable  Last Vitals:  Vitals Value Taken Time  BP 115/102 06/02/19 1101  Temp 36 C 06/02/19 1102  Pulse 79 06/02/19 1102  Resp 14 06/02/19 1102  SpO2 100 % 06/02/19 1102  Vitals shown include unvalidated device data.  Last Pain:  Vitals:   06/02/19 0857  TempSrc: Temporal  PainSc: 0-No pain         Complications: No apparent anesthesia complications

## 2019-06-02 NOTE — H&P (Signed)
Obstetrics & Gynecology Surgery H&P    Chief Complaint: Scheduled Surgery   History of Present Illness: Patient is a 44 y.o. [id number] presenting for scheduled hysteroscopy, novasure endometrial ablation, for the treatment or further evaluation of abnormal uterine bleeding.   Prior Treatments prior to proceeding with surgery include: hormonal management  Preoperative Pap: 11/25/2018 NIL HPV negative Preoperative Endometrial biopsy: 05/02/2019 Findings: pathology proliferative endometrium Preoperative Ultrasound: 12/06/2018 normal   Review of Systems:10 point review of systems  Past Medical History:  Patient Active Problem List   Diagnosis Date Noted  . Menometrorrhagia 05/02/2019  . Mixed incontinence 05/02/2019  . Pelvic pain in female 12/21/2018  . Abdominal pain, right lower quadrant 12/21/2018  . Fusion of spine of cervical region 03/13/2017  . Cervical disc disorder with radiculopathy 02/25/2017  . Lymphedema 04/24/2016  . Varicose veins of bilateral lower extremities with pain 04/08/2016  . Chronic venous insufficiency 04/08/2016  . OSA (obstructive sleep apnea) 04/04/2016    Sleep study 07/12/15 at [facility]: CPAP @ 12 cm/H20   . Adult ADHD 01/18/2016  . Bipolar I disorder, current or most recent episode depressed, with psychotic features ([place]) 01/18/2016  . Other specified anxiety disorders 01/18/2016  . Allergic rhinitis 01/15/2015  . Current smoker 01/15/2015  . H/O manic depressive disorder 01/15/2015  . H/O renal calculi 01/15/2015  . Asthma 01/15/2015  . HLD (hyperlipidemia) 10/25/2013    Past Surgical History:  Past Surgical History:  Procedure Laterality Date  . BACK SURGERY  03/2017   cervical fusion  . CHOLECYSTECTOMY    . GALLBLADDER SURGERY    . LAPAROSCOPY N/A 12/21/2018   Procedure: LAPAROSCOPY DIAGNOSTIC;  Surgeon: Will Bonnet, MD;  Location: [facility] ORS;  Service: Gynecology;  Laterality: N/A;  . PLANTAR FASCIA RELEASE Left  06/12/2017   Procedure: ENDOSCOPIC PLANTAR FASCIOTOMY-RELEASE;  Surgeon: Samara Deist, DPM;  Location: [facility] ORS;  Service: Podiatry;  Laterality: Left;  . REPAIR EXTENSOR TENDON Left 06/12/2017   Procedure: REPAIR FLEXOR TENDON;  Surgeon: Samara Deist, DPM;  Location: [facility] ORS;  Service: Podiatry;  Laterality: Left;  . TARSAL TUNNEL RELEASE Left 06/12/2017   Procedure: TARSAL TUNNEL RELEASE-BAXTER RELEASE;  Surgeon: Samara Deist, DPM;  Location: [facility] ORS;  Service: Podiatry;  Laterality: Left;  . TUBAL LIGATION      Family History:  Family History  Problem Relation Age of Onset  . Hypercholesterolemia Father   . Bipolar disorder Mother   . Hypercholesterolemia Maternal Grandmother   . Hypertension Maternal Grandmother   . Anxiety disorder Maternal Grandmother   . Depression Maternal Grandmother   . Ovarian cancer Maternal Grandmother   . Colon cancer Maternal Grandfather 18  . Ovarian cancer Paternal Grandmother     Social History:  Social History   Socioeconomic History  . Marital status: Single    Spouse name: Not on file  . Number of children: Not on file  . Years of education: Not on file  . Highest education level: Not on file  Occupational History  . Not on file  Tobacco Use  . Smoking status: Current Every Day Smoker    Packs/day: 2.00    Years: 15.00    Pack years: 30.00    Types: Cigarettes    Start date: 09/25/1994  . Smokeless tobacco: Never Used  Substance and Sexual Activity  . Alcohol use: No    Alcohol/week: 0.0 standard drinks  . Drug use: No  . Sexual activity: Yes    Birth control/protection: Surgical  Other  Topics Concern  . Not on file  Social History Narrative  . Not on file   Social Determinants of Health   Financial Resource Strain:   . Difficulty of Paying Living Expenses:   Food Insecurity:   . Worried About Charity fundraiser in the Last Year:   . Arboriculturist in the Last Year:   Transportation Needs:   . Film/video editor  (Medical):   [name] Kitchen Lack of Transportation (Non-Medical):   Physical Activity:   . Days of Exercise per Week:   . Minutes of Exercise per Session:   Stress:   . Feeling of Stress :   Social Connections:   . Frequency of Communication with Friends and Family:   . Frequency of Social Gatherings with Friends and Family:   . Attends Religious Services:   . Active Member of Clubs or Organizations:   . Attends Archivist Meetings:   [name] Kitchen Marital Status:   Intimate Partner Violence:   . Fear of Current or Ex-Partner:   . Emotionally Abused:   [name] Kitchen Physically Abused:   . Sexually Abused:     Allergies:  Allergies  Allergen Reactions  . Codeine Hives and Shortness Of Breath  . Azithromycin Diarrhea  . Sulfa Antibiotics Other (See Comments)    Severe abdominal pain.    Medications: Prior to Admission medications   Medication Sig Start Date End Date Taking? Authorizing Provider  albuterol (VENTOLIN HFA) 108 (90 Base) MCG/ACT inhaler Inhale 2 puffs into the lungs every 6 (six) hours as needed for wheezing or shortness of breath. 03/16/19  Yes Martin, Mary-Margaret, FNP  amphetamine-dextroamphetamine (ADDERALL) 15 MG tablet Take 15 mg by mouth daily at 2 PM.  05/04/17  Yes [provider]  buPROPion (WELLBUTRIN XL) 150 MG 24 hr tablet Take 150 mg by mouth every morning.  04/16/16  Yes [provider]  clonazePAM (KLONOPIN) 0.5 MG tablet Take 1 tablet (0.5 mg total) by mouth 2 (two) times daily as needed for anxiety. Patient taking differently: Take 0.5 mg by mouth 2 (two) times daily.  04/16/15  Yes Marjie Skiff, MD  fesoterodine (TOVIAZ) 8 MG TB24 tablet Take 1 tablet (8 mg total) by mouth daily. Patient taking differently: Take 8 mg by mouth every morning.  04/26/19  Yes Trinna Post, PA-C  ibuprofen (ADVIL) 800 MG tablet Take 1 tablet (800 mg total) by mouth every 8 (eight) hours as needed. 03/15/19  Yes [name] M, PA-C  ipratropium (ATROVENT) 0.03 % nasal  spray Place 2 sprays into both nostrils 3 (three) times daily as needed for rhinitis. 04/12/19  Yes Fawze, Mina A, PA-C  LATUDA 40 MG TABS tablet Take 40 mg by mouth daily after supper.  02/06/17  Yes [provider]  lisdexamfetamine (VYVANSE) 40 MG capsule Take 40 mg by mouth in the morning.    Yes [provider]  lithium 300 MG tablet Take 300 mg by mouth 2 (two) times daily. 05/04/17  Yes [provider]  loratadine (CLARITIN) 10 MG tablet One daily as needed for allergies Patient taking differently: Take 10 mg by mouth daily as needed for allergies.  11/20/17  Yes Carmon Ginsberg, PA  omeprazole (PRILOSEC) 20 MG capsule Take 1 capsule (20 mg total) by mouth daily. Patient taking differently: Take 20 mg by mouth every morning.  02/28/19  Yes [name] M, PA-C  SUMAtriptan (IMITREX) 50 MG tablet Take one tablet at onset of headache; may repeat in  2 hours if not better Patient taking differently: Take 50 mg by mouth every 2 (two) hours as needed for migraine.  03/15/19  Yes Trinna Post, PA-C  traZODone (DESYREL) 100 MG tablet Take 150 mg by mouth at bedtime.   Yes [provider]  triamcinolone cream (KENALOG) 0.1 % Apply 1 application topically 2 (two) times daily. 03/15/19  Yes Pollak, Adriana M, PA-C  TRINTELLIX 10 MG TABS Take 10 mg by mouth every morning.  08/25/16  Yes [provider]  cetirizine (ZYRTEC) 10 MG tablet Take 1 tablet (10 mg total) by mouth daily. Patient not taking: Reported on 05/25/2019 04/12/19   Rodell Perna A, PA-C  mirabegron ER (MYRBETRIQ) 25 MG TB24 tablet Take 1 tablet (25 mg total) by mouth daily. Patient not taking: Reported on 05/25/2019 04/25/19   Trinna Post, PA-C  neomycin-polymyxin-hydrocortisone (CORTISPORIN) OTIC solution Place 4 drops into the right ear 4 (four) times daily. Patient not taking: Reported on 05/25/2019 04/20/19   Chevis Pretty, FNP  tobramycin-dexamethasone [facility]) ophthalmic solution  3 drops in right ear TID Patient not taking: Reported on 05/25/2019 04/19/19   Chevis Pretty, FNP    Physical Exam Vitals: Blood pressure 107/69, pulse 79, temperature (!) 96.9 F (36.1 C), temperature source Temporal, resp. rate 16, last menstrual period 05/26/2019, SpO2 96 %. General: NAD HEENT: normocephalic, anicteric Pulmonary: No increased work of breathing, CTAB Cardiovascular: RRR, distal pulses 2+ Abdomen: soft, non-tender, non-distended Genitourinary: deferred to OR Extremities: no edema, erythema, or tenderness Neurologic: Grossly intact Psychiatric: mood appropriate, affect full  Imaging No results found.  Assessment: 44 y.o. [id number] presenting for scheduled hysteroscopy, novasure endometrial ablation  Plan: 1) The patient was counseled on the overall effectiveness of endometrial ablation in achieving amenorrhea.  She is aware that some patient may continue to have menstrual cycles although these are generally greatly reduced in flow.  In addition she was quoted a failure rate for endometrial ablation of approximately 25% within the frist 4 years, but these failures may happen at any time during or after the initial 4 year postop period.  She is aware that pregnancy is contra-indicated in the setting of prior endometrial ablation, and that ablation itself does not confer any contraceptive benefit.  She will therefore need to continue to rely on some means of contraception following the procedure.  Although rare and generally confined to patient who have undergone prior tubal ligatoin, post-ablation tubal sterilizaton syndrome (PATSS) may also occur with no reliable incidence rates as the majority of published literature is limited to case reports.   Prior to being considered a candidate for Novasure ablation she will need to undergo endometrial biopsy to rule out endometrial hyperplasia or malignancy as the cause of her bleeding, have an up to date pap on record, and undergo  transvaginal ultrasound to verify the absence of focal endometrial lesion which may need to be addressed prior to proceeding with ablation.  In addition she is aware that the device is limited for use in women with a normal uterine cavity and uterine leiomyomata <3cm in size.  If present leiomyomata may increase the long-term failure rate of the procedure.  In rare instances the presence of a uterine septum or arcuate uterus, which may not be readily apparent on preoperative ultrasound, may necessitate the procedure to be aborted.     2) Routine postoperative instructions were reviewed with the patient and her family in detail today including the expected length of recovery and likely postoperative course.  The patient concurred with the proposed plan, giving informed written consent for the surgery today.  Patient instructed on the importance of being NPO after midnight prior to her procedure.  If warranted preoperative prophylactic antibiotics and SCDs ordered on call to the OR to meet SCIP guidelines and adhere to recommendation laid forth in [place] Number 104 May 2009  "Antibiotic Prophylaxis for Gynecologic Procedures".     Malachy Mood, MD, [facility], [facility] 06/02/2019, 9:50 AM

## 2019-06-02 NOTE — Anesthesia Procedure Notes (Addendum)
Procedure Name: Intubation Date/Time: 06/02/2019 10:09 AM Performed by: Johnna Acosta, CRNA Pre-anesthesia Checklist: Patient identified, Patient being monitored, Timeout performed, Emergency Drugs available and Suction available Patient Re-evaluated:Patient Re-evaluated prior to induction Oxygen Delivery Method: Circle system utilized Preoxygenation: Pre-oxygenation with 100% oxygen Induction Type: IV induction Ventilation: Mask ventilation without difficulty Laryngoscope Size: Mac, 3 and McGraph Grade View: Grade I Tube type: Oral Tube size: 7.0 mm Number of attempts: 1 Airway Equipment and Method: Stylet Placement Confirmation: ETT inserted through vocal cords under direct vision,  positive ETCO2 and breath sounds checked- equal and bilateral Secured at: 20 cm Tube secured with: Tape Dental Injury: Teeth and Oropharynx as per pre-operative assessment

## 2019-06-08 NOTE — Progress Notes (Signed)
      Postoperative Follow-up Patient presents post op from hysteroscopy, novasure endometrial ablation 1weeks ago for abnormal uterine bleeding.  Subjective: Patient reports marked improvement in her preop symptoms. Eating a regular diet without difficulty. Pain is controlled without any medications.  Activity: normal activities of daily living.  Objective: Blood pressure 91/71, pulse 76, weight 221 lb (100.2 kg), last menstrual period 05/26/2019.  General: NAD Pulmonary: no increased work of breathing Extremities: no edema Neurologic: normal gait  Admission on 06/02/2019, Discharged on 06/02/2019  Component Date Value Ref Range Status  . Preg Test, Ur 06/02/2019 NEGATIVE  NEGATIVE Final   Comment:        THE SENSITIVITY OF THIS METHODOLOGY IS >24 mIU/mL     Assessment: 44 y.o. s/p hysteroscopy, novasure endometrial ablation stable  Plan: Patient has done well after surgery with no apparent complications.  I have discussed the post-operative course to date, and the expected progress moving forward.  The patient understands what complications to be concerned about.  I will see the patient in routine follow up, or sooner if needed.    Activity plan: No restriction.   Malachy Mood, MD, [facility], [facility] 06/08/2019, 5:21 PM

## 2019-06-09 ENCOUNTER — Ambulatory Visit (INDEPENDENT_AMBULATORY_CARE_PROVIDER_SITE_OTHER): Payer: Medicaid Other | Admitting: Obstetrics and Gynecology

## 2019-06-09 ENCOUNTER — Encounter: Payer: Self-pay | Admitting: Obstetrics and Gynecology

## 2019-06-09 ENCOUNTER — Other Ambulatory Visit: Payer: Self-pay

## 2019-06-09 VITALS — BP 91/71 | HR 76 | Wt 221.0 lb

## 2019-06-09 DIAGNOSIS — N938 Other specified abnormal uterine and vaginal bleeding: Secondary | ICD-10-CM

## 2019-06-09 DIAGNOSIS — Z4889 Encounter for other specified surgical aftercare: Secondary | ICD-10-CM | POA: Diagnosis not present

## 2019-06-15 ENCOUNTER — Other Ambulatory Visit: Payer: Self-pay | Admitting: Physician Assistant

## 2019-06-15 DIAGNOSIS — L409 Psoriasis, unspecified: Secondary | ICD-10-CM

## 2019-06-15 DIAGNOSIS — G43809 Other migraine, not intractable, without status migrainosus: Secondary | ICD-10-CM

## 2019-06-30 ENCOUNTER — Telehealth: Payer: Self-pay

## 2019-06-30 DIAGNOSIS — R21 Rash and other nonspecific skin eruption: Secondary | ICD-10-CM

## 2019-06-30 NOTE — Telephone Encounter (Signed)
Copied from [place] [id number]. Topic: Referral - Request for Referral >> Jun 30, 2019 10:35 AM Celene Kras wrote: Has patient seen PCP for this complaint? Yes.   *If NO, is insurance requiring patient see PCP for this issue before PCP can refer them? Referral for which specialty: Dermatology Preferred provider/office: n/a Reason for referral: Pt states that the cream she received for her psoriasis is making it worse. Please advise.

## 2019-06-30 NOTE — Addendum Note (Signed)
Addended by: Trinna Post on: 06/30/2019 02:32 PM   Modules accepted: Orders

## 2019-06-30 NOTE — Telephone Encounter (Signed)
Referral placed.

## 2019-07-07 ENCOUNTER — Ambulatory Visit: Payer: Medicaid Other | Admitting: Dermatology

## 2019-07-07 ENCOUNTER — Other Ambulatory Visit: Payer: Self-pay

## 2019-07-07 DIAGNOSIS — L409 Psoriasis, unspecified: Secondary | ICD-10-CM

## 2019-07-07 MED ORDER — ENSTILAR 0.005-0.064 % EX FOAM: CUTANEOUS | 3 refills | Status: DC

## 2019-07-07 NOTE — Patient Instructions (Signed)
Psoriasis  What causes psoriasis? A patient's immune system plays a role in the development of psoriasis through the over-activity of a type of white blood cell called a T cell.  Once these cells are activated, a reaction is triggered that causes the skin to grow faster than normal.  New skin cells form in days instead of weeks, and these cells do not shed.  These cells pile up on the skin, and this results in what you see as psoriasis.  It is not contagious.  There is a genetic component to psoriasis, although not everyone inherits the gene.  What triggers psoriasis? Common triggers are stress, strep throat infection (more commonly in kids), and cold weather.  During stressful events, psoriasis tends to flare.  Certain medications such as lithium, some blood pressure medications, and some drugs used to treat malaria can trigger psoriasis.  A skin injury can also trigger psoriasis to develop at the site of injury.  Types of Psoriasis 1. Plaque psoriasis:  80% of patients with psoriasis have plaque psoriasis.  Plaques usually form on elbows, knees, and lower back and appear raised and reddish with a silvery white scale. 2. Nail psoriasis:  Fingernails and toenails may be affected.  Initially small pits may form, then with time, the nails thicken, lift up, and crumble.   3. Scalp psoriasis:  Similar in appearance to plaque psoriasis on the body.  Tends to be itchy.  Clinically can resemble dandruff because of the scales that can fall on a patient's shirt.  This may be difficult to control. 4. Pustular psoriasis:  Usually involves the palms and soles appearing as white, pus-filled bumps.  Rarely, it may develop all over the body, which can make patients seriously ill. 5. Guttate psoriasis:  Usually occurs in children and young adults.  The lesions are smaller than plaque psoriasis.  May be triggered by a strep throat infection.  Many times it clears on its own, and the patient may or may not develop  psoriasis again.   6. Inverse psoriasis:  Involves the folds of the skin and may be painful.  This appears as red, shiny patches.  It may involve the armpits, under the breasts, the genital area, or the crease of the buttock.  7. Psoriatic arthritis:  Up to one third of patients with psoriasis will develop arthritis.  It commonly affects the hands, feet, and spine, and may begin as stiffness.  If untreated it may lead to permanent joint damage.  Treatment There is no cure for psoriasis, but it can be controlled.  Some patients undergo more than one type of treatment.  1. Topical medications (applied externally to the skin) a. Corticosteroids:  typically first-line treatment for psoriasis.  They control the inflammation of psoriasis.  They are available as creams, ointments, sprays, foams, or lotions.  It is important to follow your dermatologist's instructions as to how to apply the medicine.  For example, excessive use of strong steroid creams (especially to body creases and the face) can cause thinning and lightening of the skin.  This can lead to the formation of stretch marks in the folds.   b. Calcipotriene and calcipotriol (Vitamin D derivatives):  These are usually used in conjunction with a steroid cream.  Sometimes they may be used as maintenance once psoriasis is under control. c. Retinoids:  sometimes used in conjunction with a topical steroid cream.  Women should not use retinoids if they are pregnant. d. Coal tar:  an older treatment that   is still effective, especially in combination with steroids.  It can be messy and may have an odor in some preparations. 2. Light treatments:  may provide a safe and effective treatment for psoriasis.  The main risks of light therapy are sunburn and possible increase in skin cancers.   a. Laser therapy:  can treat a certain stubborn area of psoriasis such as scalp, feet, and hands.  It is not used for large areas. b. Narrowband UVB:  A patient stands in  front of panels of lights for a set amount of time.  A typical course might be 24 treatments over 2 months.  c. PUVA:  This treatment combines exposure to UVA light with light-sensitizing medication called psoralen.  It may come as a pill or as a lotion.  The main side effects are nausea (from the psoralen pill) and significantly increased risk of skin cancer. 3. Oral medications:  used for moderate to severe psoriasis.  These medications are very effective, but they have a number of potentially serious side effects. a. Methotrexate b. Soriatane (acitretin) c. Cyclosporine 4. Biologic medications:  used for moderate to severe psoriasis.  These are newer medications that suppress the immune cells responsible for psoriasis.  They generally produce good results, but they all increase the risk of infection.  These are given as injections or IV infusions. a. Enbrel (etanercept) b. Humira (adalimumab) c. Remicade (infliximab) d. Stelara (ustekinumab)  Recent studies have found that patients with psoriasis are more prone to developing metabolic syndrome:  high blood pressure, coronary artery disease, high cholesterol, and diabetes.  It is very important for patients with psoriasis to live healthy lifestyles.  Diet and exercise are important.  Support groups:   www.psoriasis.org , http://www.[facility].com/psoriasisnet/index.html    

## 2019-07-07 NOTE — Progress Notes (Signed)
   New Patient Visit  Subjective  Ann Morales is a 44 y.o. female who presents for the following: Rash (started 2-3 months ago. Patient does have a diagnosis of psoriasis but she isn't sure if it's the same thing. Rash is itchy and painful she is currently using a topical TMC 0.1% cream BID but states that it hasn't helped).  The following portions of the chart were reviewed this encounter and updated as appropriate:  Tobacco  Allergies  Meds  Problems  Med Hx  Surg Hx  Fam Hx     Review of Systems:  No other skin or systemic complaints except as noted in HPI or Assessment and Plan.  Objective  Well appearing patient in no apparent distress; mood and affect are within normal limits.  A focused examination was performed including B/L elbows, arms, hands, and fingers. Relevant physical exam findings are noted in the Assessment and Plan.  Objective  B/L elbows, hands, finger: 7.0 x 2.5 cm plaque of the left elbow, pink plaques of the knuckles and dorsum hand  Images         Assessment & Plan  Psoriasis B/L elbows, hands, finger  Discussed with patient that condition is not curable but is treatable, may flare during periods of stress (mental/physical), cooler weather, and sickness - BSA 2% - Start Enstilar foam to aa's QD-BID x 2 weeks then decrease use to 5d/wk PRN flares. Discussed Xtrac laser treatment - pamphlet given. Pamphlet given on psoriasis.   Calcipotriene-Betameth Diprop (ENSTILAR) 0.005-0.064 % FOAM - B/L elbows, hands, finger  Other Related Medications triamcinolone cream (KENALOG) 0.1 %  Return in about 3 months (around 10/06/2019).  Luther Redo, CMA, am acting as scribe for Sarina Ser, MD .   Documentation: I have reviewed the above documentation for accuracy and completeness, and I agree with the above.  Sarina Ser, MD

## 2019-07-08 ENCOUNTER — Encounter: Payer: Self-pay | Admitting: Dermatology

## 2019-07-11 ENCOUNTER — Telehealth: Payer: Self-pay

## 2019-07-11 MED ORDER — DOVONEX 0.005 % EX CREA: TOPICAL_CREAM | Freq: Two times a day (BID) | CUTANEOUS | 2 refills | Status: DC

## 2019-07-11 MED ORDER — CLOBETASOL PROPIONATE 0.05 % EX CREA: 1.0000 "application " | TOPICAL_CREAM | Freq: Two times a day (BID) | CUTANEOUS | 0 refills | Status: DC

## 2019-07-11 NOTE — Telephone Encounter (Signed)
Prescription sent in  

## 2019-07-11 NOTE — Telephone Encounter (Signed)
Patient has [employer]. She must try and fail branded Calcipotriene before Lucio Edward is approved.

## 2019-07-11 NOTE — Telephone Encounter (Signed)
Patient advised Calcipotriene sent in.   Betamethasone Valerate Cream is on patients formulary, not Betamethasone Dipropionate. Please advise.

## 2019-07-11 NOTE — Telephone Encounter (Signed)
Try Clobetasol cream.

## 2019-07-11 NOTE — Telephone Encounter (Signed)
Send:  Calcipotriene bid aa 7 days per week  and  Betamethasone cr bid 5 d per wk.  Advise pt  keep fu appt.

## 2019-07-13 ENCOUNTER — Ambulatory Visit: Payer: Medicaid Other | Admitting: Obstetrics and Gynecology

## 2019-07-26 ENCOUNTER — Ambulatory Visit (INDEPENDENT_AMBULATORY_CARE_PROVIDER_SITE_OTHER): Payer: Medicaid Other

## 2019-07-26 ENCOUNTER — Other Ambulatory Visit: Payer: Self-pay

## 2019-07-26 DIAGNOSIS — L400 Psoriasis vulgaris: Secondary | ICD-10-CM

## 2019-07-26 NOTE — Progress Notes (Signed)
Total Surface Area: 120cm2 Total Energy: 36J

## 2019-07-28 ENCOUNTER — Other Ambulatory Visit: Payer: Self-pay

## 2019-07-28 ENCOUNTER — Ambulatory Visit: Payer: Medicaid Other

## 2019-07-28 DIAGNOSIS — L400 Psoriasis vulgaris: Secondary | ICD-10-CM

## 2019-07-28 NOTE — Progress Notes (Signed)
Total Surface Area: 112cm2 Total Energy: 40.32J

## 2019-08-01 ENCOUNTER — Other Ambulatory Visit: Payer: Self-pay

## 2019-08-01 ENCOUNTER — Ambulatory Visit: Payer: Medicaid Other

## 2019-08-01 DIAGNOSIS — L400 Psoriasis vulgaris: Secondary | ICD-10-CM

## 2019-08-01 NOTE — Progress Notes (Signed)
Total Surface Area: 108cm2 Total Energy: 44.71J

## 2019-08-04 ENCOUNTER — Ambulatory Visit: Payer: Medicaid Other

## 2019-08-09 ENCOUNTER — Other Ambulatory Visit: Payer: Self-pay

## 2019-08-09 DIAGNOSIS — R102 Pelvic and perineal pain: Secondary | ICD-10-CM

## 2019-08-09 MED ORDER — IBUPROFEN 800 MG PO TABS: 800.0000 mg | ORAL_TABLET | Freq: Three times a day (TID) | ORAL | 0 refills | Status: DC | PRN

## 2019-08-12 ENCOUNTER — Encounter: Payer: Self-pay | Admitting: Physician Assistant

## 2019-08-12 ENCOUNTER — Other Ambulatory Visit: Payer: Self-pay

## 2019-08-12 ENCOUNTER — Ambulatory Visit: Payer: Medicaid Other | Admitting: Physician Assistant

## 2019-08-12 VITALS — BP 132/88 | HR 76 | Temp 97.1°F | Wt 233.2 lb

## 2019-08-12 DIAGNOSIS — M25473 Effusion, unspecified ankle: Secondary | ICD-10-CM | POA: Diagnosis not present

## 2019-08-12 DIAGNOSIS — M25579 Pain in unspecified ankle and joints of unspecified foot: Secondary | ICD-10-CM | POA: Diagnosis not present

## 2019-08-12 DIAGNOSIS — Z8669 Personal history of other diseases of the nervous system and sense organs: Secondary | ICD-10-CM

## 2019-08-12 MED ORDER — PROPRANOLOL HCL 40 MG PO TABS: 40.0000 mg | ORAL_TABLET | Freq: Two times a day (BID) | ORAL | 0 refills | Status: DC

## 2019-08-12 NOTE — Progress Notes (Signed)
Established patient visit   Patient: Ann Morales   DOB: 01/23/1976   44 y.o. Female  MRN: [id number] Visit Date: 08/12/2019  Today's healthcare provider: Trinna Post, PA-C   Chief Complaint  Patient presents with  . Migraine  . Ankle Pain  I,Ann Morales,acting as a scribe for Trinna Post, PA-C.,have documented all relevant documentation on the behalf of Trinna Post, PA-C,as directed by  Trinna Post, PA-C while in the presence of Trinna Post, PA-C.  Subjective    Migraine  This is a new problem. The current episode started more than 1 month ago. The problem occurs daily. The problem has been unchanged. The pain does not radiate. The pain quality is not similar to prior headaches. The quality of the pain is described as aching, band-like, boring, dull, pulsating, sharp, shooting, stabbing, throbbing, squeezing and thunderclap. The pain is at a severity of 10/10. The pain is mild. Associated symptoms include nausea, numbness, tingling and vomiting. Pertinent negatives include no blurred vision, dizziness, loss of balance, phonophobia, photophobia, visual change or weakness. The symptoms are aggravated by activity, bright light and weather changes. She has tried Excedrin, darkened room and NSAIDs for the symptoms. The treatment provided mild relief. Her past medical history is significant for migraine headaches.  Ankle Pain  There was no injury mechanism. The pain is present in the left leg, left ankle, right leg, right ankle, left foot and right foot. The quality of the pain is described as aching and shooting. The pain is at a severity of 8/10. The pain is mild. The pain has been fluctuating since onset. Associated symptoms include an inability to bear weight, numbness and tingling. Associated symptoms comments: Left side is worse than right. She reports no foreign bodies present. The symptoms are aggravated by movement and weight bearing. She has tried  non-weight bearing and NSAIDs for the symptoms. The treatment provided no relief.   Patient reports that she has to use the Imitrex 2 or 3 times per week.         Medications: Outpatient Medications Prior to Visit  Medication Sig  . albuterol (VENTOLIN HFA) 108 (90 Base) MCG/ACT inhaler Inhale 2 puffs into the lungs every 6 (six) hours as needed for wheezing or shortness of breath.  . amphetamine-dextroamphetamine (ADDERALL) 15 MG tablet Take 15 mg by mouth daily at 2 PM.   . buPROPion (WELLBUTRIN XL) 150 MG 24 hr tablet Take 150 mg by mouth every morning.   . Calcipotriene-Betameth Diprop (ENSTILAR) 0.005-0.064 % FOAM Apply to aa's psoriasis QD-BID x 2 weeks. Then decrease use to 5d/wk.  . cetirizine (ZYRTEC) 10 MG tablet Take 1 tablet (10 mg total) by mouth daily.  . clobetasol cream (TEMOVATE) 4.58 % Apply 1 application topically 2 (two) times daily. Up to 5 days per week  . clonazePAM (KLONOPIN) 0.5 MG tablet Take 1 tablet (0.5 mg total) by mouth 2 (two) times daily as needed for anxiety. (Patient taking differently: Take 0.5 mg by mouth 2 (two) times daily. )  . DOVONEX 0.005 % cream Apply topically 2 (two) times daily.  . fesoterodine (TOVIAZ) 8 MG TB24 tablet Take 1 tablet (8 mg total) by mouth daily. (Patient taking differently: Take 8 mg by mouth every morning. )  . ibuprofen (ADVIL) 800 MG tablet Take 1 tablet (800 mg total) by mouth every 8 (eight) hours as needed.  [name] Kitchen ipratropium (ATROVENT) 0.03 % nasal spray Place 2 sprays into both  nostrils 3 (three) times daily as needed for rhinitis.  [name] Kitchen LATUDA 40 MG TABS tablet Take 40 mg by mouth daily after supper.   . lisdexamfetamine (VYVANSE) 40 MG capsule Take 40 mg by mouth in the morning.   . lithium 300 MG tablet Take 300 mg by mouth 2 (two) times daily.  [name] Kitchen loratadine (CLARITIN) 10 MG tablet One daily as needed for allergies (Patient taking differently: Take 10 mg by mouth daily as needed for allergies. )  . mirabegron ER  (MYRBETRIQ) 25 MG TB24 tablet Take 1 tablet (25 mg total) by mouth daily.  [name] Kitchen omeprazole (PRILOSEC) 20 MG capsule Take 1 capsule (20 mg total) by mouth daily. (Patient taking differently: Take 20 mg by mouth every morning. )  . SUMAtriptan (IMITREX) 50 MG tablet TAKE ONE TABLET AT ONSET OF HEADACHE MAY REPEAT IN 2 HOURS IF NOT BETTER  . traZODone (DESYREL) 100 MG tablet Take 150 mg by mouth at bedtime.  . TRINTELLIX 10 MG TABS Take 10 mg by mouth every morning.   [name] Kitchen HYDROcodone-acetaminophen (NORCO/VICODIN) 5-325 MG tablet Take 1 tablet by mouth every 4 (four) hours as needed for moderate pain. (Patient not taking: Reported on 08/12/2019)  . neomycin-polymyxin-hydrocortisone (CORTISPORIN) OTIC solution Place 4 drops into the right ear 4 (four) times daily. (Patient not taking: Reported on 08/12/2019)  . tobramycin-dexamethasone (TOBRADEX) ophthalmic solution 3 drops in right ear TID (Patient not taking: Reported on 08/12/2019)  . triamcinolone cream (KENALOG) 0.1 % APPLY TO AFFECTED AREA TWICE A DAY (Patient not taking: Reported on 08/12/2019)   No facility-administered medications prior to visit.    Review of Systems  Eyes: Negative for blurred vision and photophobia.  Gastrointestinal: Positive for nausea and vomiting.  Neurological: Positive for tingling and numbness. Negative for dizziness, weakness and loss of balance.      Objective    BP 132/88 (BP Location: Left Arm, Patient Position: Sitting, Cuff Size: Normal)   Pulse 76   Temp (!) 97.1 F (36.2 C) (Temporal)   Wt 233 lb 3.2 oz (105.8 kg)   SpO2 98%   BMI 36.52 kg/m    Physical Exam Constitutional:      Appearance: Normal appearance. She is normal weight.  Cardiovascular:     Pulses: Normal pulses.     Heart sounds: Normal heart sounds.  Pulmonary:     Effort: Pulmonary effort is normal.     Breath sounds: Normal breath sounds.  Musculoskeletal:     Right ankle: Swelling present.     Left ankle: Swelling present.  Skin:     General: Skin is warm and dry.  Neurological:     General: No focal deficit present.     Mental Status: She is alert and oriented to person, place, and time.  Psychiatric:        Mood and Affect: Mood normal.        Behavior: Behavior normal.       No results found for any visits on 08/12/19.  Assessment & Plan    1. History of migraine headaches Patient reports she has migraine headaches 2-3 times a week and would like another medication. Patient was started on Propranolol 40 MG as below. - propranolol (INDERAL) 40 MG tablet; Take 1 tablet (40 mg total) by mouth 2 (two) times daily.  Dispense: 180 tablet; Refill: 0  2. Pain and swelling of ankle, unspecified laterality  Patient reports swelling as well. 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 Patient  was advised to use compression socks to help reduce the swelling.  - CBC with Differential/Platelet - Comprehensive metabolic panel - TSH   Return in about 2 months (around 10/12/2019) for migraine.      ITrinna Post, PA-C, have reviewed all documentation for this visit. The documentation on 08/12/19 for the exam, diagnosis, procedures, and orders are all accurate and complete.    Ann Morales  [facility] [phone] (phone) [phone] (fax)  [place]

## 2019-08-12 NOTE — Patient Instructions (Signed)

## 2019-08-13 LAB — COMPREHENSIVE METABOLIC PANEL
ALT: 16 IU/L (ref 0–32)
AST: 11 IU/L (ref 0–40)
Albumin/Globulin Ratio: 1.7 (ref 1.2–2.2)
Albumin: 3.8 g/dL (ref 3.8–4.8)
Alkaline Phosphatase: 74 IU/L (ref 48–121)
BUN/Creatinine Ratio: 13 (ref 9–23)
BUN: 9 mg/dL (ref 6–24)
Bilirubin Total: <0.2 mg/dL (ref 0.0–1.2)
CO2: 24 mmol/L (ref 20–29)
Calcium: 9 mg/dL (ref 8.7–10.2)
Chloride: 104 mmol/L (ref 96–106)
Creatinine, Ser: 0.68 mg/dL (ref 0.57–1.00)
GFR calc Af Amer: 123 mL/min/{1.73_m2} (ref >59)
GFR calc non Af Amer: 107 mL/min/{1.73_m2} (ref >59)
Globulin, Total: 2.2 g/dL (ref 1.5–4.5)
Glucose: 109 mg/dL — ABNORMAL HIGH (ref 65–99)
Potassium: 4.2 mmol/L (ref 3.5–5.2)
Sodium: 140 mmol/L (ref 134–144)
Total Protein: 6 g/dL (ref 6.0–8.5)

## 2019-08-13 LAB — CBC WITH DIFFERENTIAL/PLATELET
Basophils Absolute: 0.1 10*3/uL (ref 0.0–0.2)
Basos: 0 %
EOS (ABSOLUTE): 0.2 10*3/uL (ref 0.0–0.4)
Eos: 2 %
Hematocrit: 41.9 % (ref 34.0–46.6)
Hemoglobin: 13.7 g/dL (ref 11.1–15.9)
Immature Grans (Abs): 0 10*3/uL (ref 0.0–0.1)
Immature Granulocytes: 0 %
Lymphocytes Absolute: 3 10*3/uL (ref 0.7–3.1)
Lymphs: 27 %
MCH: 28.4 pg (ref 26.6–33.0)
MCHC: 32.7 g/dL (ref 31.5–35.7)
MCV: 87 fL (ref 79–97)
Monocytes Absolute: 0.6 10*3/uL (ref 0.1–0.9)
Monocytes: 5 %
Neutrophils Absolute: 7.4 10*3/uL — ABNORMAL HIGH (ref 1.4–7.0)
Neutrophils: 66 %
Platelets: 226 10*3/uL (ref 150–450)
RBC: 4.82 x10E6/uL (ref 3.77–5.28)
RDW: 12.8 % (ref 11.7–15.4)
WBC: 11.2 10*3/uL — ABNORMAL HIGH (ref 3.4–10.8)

## 2019-08-13 LAB — TSH: TSH: 0.961 u[IU]/mL (ref 0.450–4.500)

## 2019-08-17 ENCOUNTER — Other Ambulatory Visit: Payer: Self-pay

## 2019-08-17 ENCOUNTER — Ambulatory Visit: Payer: Medicaid Other

## 2019-08-17 DIAGNOSIS — L400 Psoriasis vulgaris: Secondary | ICD-10-CM | POA: Diagnosis not present

## 2019-08-17 NOTE — Progress Notes (Signed)
Total Surface Area: 116cm2 Total Energy: 48.02J

## 2019-08-18 ENCOUNTER — Ambulatory Visit: Payer: Medicaid Other | Admitting: Obstetrics and Gynecology

## 2019-08-19 ENCOUNTER — Other Ambulatory Visit: Payer: Self-pay | Admitting: Physician Assistant

## 2019-08-19 DIAGNOSIS — G43809 Other migraine, not intractable, without status migrainosus: Secondary | ICD-10-CM

## 2019-08-22 ENCOUNTER — Ambulatory Visit: Payer: Medicaid Other

## 2019-08-25 ENCOUNTER — Ambulatory Visit: Payer: Medicaid Other

## 2019-08-25 ENCOUNTER — Other Ambulatory Visit: Payer: Self-pay

## 2019-08-25 DIAGNOSIS — L400 Psoriasis vulgaris: Secondary | ICD-10-CM

## 2019-08-25 NOTE — Progress Notes (Signed)
Total Surface Area: 104cm2 Total Energy: 49.50J

## 2019-08-26 ENCOUNTER — Ambulatory Visit (INDEPENDENT_AMBULATORY_CARE_PROVIDER_SITE_OTHER): Payer: Medicaid Other | Admitting: Obstetrics and Gynecology

## 2019-08-26 ENCOUNTER — Encounter: Payer: Self-pay | Admitting: Obstetrics and Gynecology

## 2019-08-26 ENCOUNTER — Ambulatory Visit: Payer: Medicaid Other | Admitting: Obstetrics and Gynecology

## 2019-08-26 VITALS — BP 120/74 | HR 70 | Ht 67.0 in | Wt 237.0 lb

## 2019-08-26 DIAGNOSIS — Z4889 Encounter for other specified surgical aftercare: Secondary | ICD-10-CM

## 2019-08-26 NOTE — Progress Notes (Signed)
Postoperative Follow-up Patient presents post op from hysteroscopy, novasure endometrial ablation 6weeks ago for abnormal uterine bleeding.  Subjective: Patient reports marked improvement in her preop symptoms. Eating a regular diet without difficulty. The patient is not having any pain.  Activity: normal activities of daily living.  Objective: Blood pressure 120/74, pulse 70, height '5\' 7"'  (1.702 m), weight 237 lb (107.5 kg).  General: NAD Pulmonary: no increased work of breathing GU: normal external female genitalia normal cervix, no CMT, uterus normal in shape and contour, no adnexal tenderness or masses Extremities: no edema Neurologic: normal gait    Office Visit on 08/12/2019  Component Date Value Ref Range Status  . WBC 08/12/2019 11.2* 3.4 - 10.8 x10E3/uL Final  . RBC 08/12/2019 4.82  3.77 - 5.28 x10E6/uL Final  . Hemoglobin 08/12/2019 13.7  11.1 - 15.9 g/dL Final  . Hematocrit 08/12/2019 41.9  34.0 - 46.6 % Final  . MCV 08/12/2019 87  79 - 97 fL Final  . MCH 08/12/2019 28.4  26.6 - 33.0 pg Final  . MCHC 08/12/2019 32.7  31 - 35 g/dL Final  . RDW 08/12/2019 12.8  11.7 - 15.4 % Final  . Platelets 08/12/2019 226  150 - 450 x10E3/uL Final  . Neutrophils 08/12/2019 66  Not Estab. % Final  . Lymphs 08/12/2019 27  Not Estab. % Final  . Monocytes 08/12/2019 5  Not Estab. % Final  . Eos 08/12/2019 2  Not Estab. % Final  . Basos 08/12/2019 0  Not Estab. % Final  . Neutrophils Absolute 08/12/2019 7.4* 1 - 7 x10E3/uL Final  . Lymphocytes Absolute 08/12/2019 3.0  0 - 3 x10E3/uL Final  . Monocytes Absolute 08/12/2019 0.6  0 - 0 x10E3/uL Final  . EOS (ABSOLUTE) 08/12/2019 0.2  0.0 - 0.4 x10E3/uL Final  . Basophils Absolute 08/12/2019 0.1  0 - 0 x10E3/uL Final  . Immature Granulocytes 08/12/2019 0  Not Estab. % Final  . Immature Grans (Abs) 08/12/2019 0.0  0.0 - 0.1 x10E3/uL Final  . Glucose 08/12/2019 109* 65 - 99 mg/dL Final  . BUN 08/12/2019 9  6 - 24 mg/dL Final  .  Creatinine, Ser 08/12/2019 0.68  0.57 - 1.00 mg/dL Final  . GFR calc non Af Amer 08/12/2019 107  >59 mL/min/1.73 Final  . GFR calc Af Amer 08/12/2019 123  >59 mL/min/1.73 Final   Comment: **[facility] currently reports eGFR in compliance with the current**   recommendations of the [employer]. [facility] will   update reporting as new guidelines are published from the NKF-ASN   Task force.   . BUN/Creatinine Ratio 08/12/2019 13  9 - 23 Final  . Sodium 08/12/2019 140  134 - 144 mmol/L Final  . Potassium 08/12/2019 4.2  3.5 - 5.2 mmol/L Final  . Chloride 08/12/2019 104  96 - 106 mmol/L Final  . CO2 08/12/2019 24  20 - 29 mmol/L Final  . Calcium 08/12/2019 9.0  8.7 - 10.2 mg/dL Final  . Total Protein 08/12/2019 6.0  6.0 - 8.5 g/dL Final  . Albumin 08/12/2019 3.8  3.8 - 4.8 g/dL Final  . Globulin, Total 08/12/2019 2.2  1.5 - 4.5 g/dL Final  . Albumin/Globulin Ratio 08/12/2019 1.7  1.2 - 2.2 Final  . Bilirubin Total 08/12/2019 <0.2  0.0 - 1.2 mg/dL Final  . Alkaline Phosphatase 08/12/2019 74  48 - 121 IU/L Final  . AST 08/12/2019 11  0 - 40 IU/L Final  . ALT 08/12/2019 16  0 -  32 IU/L Final  . TSH 08/12/2019 0.961  0.450 - 4.500 uIU/mL Final    Assessment: 44 y.o. s/p hysteroscopy, Novasure endometrial ablation stable  Plan: Patient has done well after surgery with no apparent complications.  I have discussed the post-operative course to date, and the expected progress moving forward.  The patient understands what complications to be concerned about.  I will see the patient in routine follow up, or sooner if needed.    Activity plan: No restriction.   Malachy Mood, MD, [facility], [facility] 08/26/2019, 2:32 PM

## 2019-08-30 ENCOUNTER — Ambulatory Visit: Payer: Medicaid Other

## 2019-09-01 ENCOUNTER — Ambulatory Visit: Payer: Medicaid Other

## 2019-09-01 ENCOUNTER — Other Ambulatory Visit: Payer: Self-pay

## 2019-09-01 DIAGNOSIS — L400 Psoriasis vulgaris: Secondary | ICD-10-CM

## 2019-09-01 NOTE — Progress Notes (Signed)
Total Surface Area: 112cm2 Total Energy: 61.26J

## 2019-09-05 ENCOUNTER — Ambulatory Visit: Payer: Medicaid Other

## 2019-09-05 ENCOUNTER — Other Ambulatory Visit: Payer: Self-pay

## 2019-09-05 DIAGNOSIS — L400 Psoriasis vulgaris: Secondary | ICD-10-CM

## 2019-09-05 NOTE — Progress Notes (Signed)
Total Surface Area: 112cm2 Total Energy: 70.45J

## 2019-09-07 ENCOUNTER — Ambulatory Visit: Payer: Medicaid Other

## 2019-09-20 ENCOUNTER — Other Ambulatory Visit: Payer: Self-pay

## 2019-09-20 ENCOUNTER — Ambulatory Visit (INDEPENDENT_AMBULATORY_CARE_PROVIDER_SITE_OTHER): Payer: Medicaid Other

## 2019-09-20 DIAGNOSIS — L400 Psoriasis vulgaris: Secondary | ICD-10-CM

## 2019-09-20 NOTE — Progress Notes (Signed)
Total Surface Area: 108cm2 Total Energy: 78.08J

## 2019-09-22 ENCOUNTER — Ambulatory Visit: Payer: Medicaid Other

## 2019-10-06 ENCOUNTER — Other Ambulatory Visit: Payer: Self-pay

## 2019-10-06 ENCOUNTER — Ambulatory Visit (INDEPENDENT_AMBULATORY_CARE_PROVIDER_SITE_OTHER): Payer: Medicaid Other | Admitting: Dermatology

## 2019-10-06 DIAGNOSIS — L409 Psoriasis, unspecified: Secondary | ICD-10-CM

## 2019-10-06 NOTE — Progress Notes (Addendum)
   Follow-Up Visit   Subjective  Ann Morales is a 44 y.o. female who presents for the following: Psoriasis.  Patient here today for psoriasis follow up at elbows, hands, fingers, ears and left leg. She started Xtrac treatments in June but has not been able to keep appointments the last 2-3 weeks due to work. Patient is using clobetasol and Dovonex, not able to get Enstilar. She would like to talk about Skyrizi.   The following portions of the chart were reviewed this encounter and updated as appropriate:  Tobacco  Allergies  Meds  Problems  Med Hx  Surg Hx  Fam Hx     Review of Systems:  No other skin or systemic complaints except as noted in HPI or Assessment and Plan.  Objective  Well appearing patient in no apparent distress; mood and affect are within normal limits.  A focused examination was performed including arms, legs, hands, fingers and ears. Relevant physical exam findings are noted in the Assessment and Plan.  Objective  Hands, elbows, ears, thigh: Psoriatic plaques at thighs, elbows, ears, hands 7.0cm plaque at left elbow   Assessment & Plan  Psoriasis - severe and symptomatic -  poorly controlled on topicals and Xtrac laser. Pt wants to pursue more aggressive treatments. Hands, elbows, ears, thigh  Start Otezla titrating up to 30mg  once daily. Reviewed with patient and she will start with 10mg  daily, then increase to 20mg  then 30mg  and hold. Samples x 2 given to pt. Lot # [id number]  Exp: 03/2019 Cont clobetasol and dovonex as needed.  Side effects of Otezla (apremilast) include diarrhea, nausea, headache, upper respiratory infection, depression, and weight decrease (5-10%). It should only be taken by pregnant women after a discussion regarding risks and benefits with their doctor.  Patient is bi-polar and does suffer from anxiety as well as migraines.  No kidney problems.   May consider the [facility] the patient asked about (or other appropriate systemic  "biologic" injection  treatment) if oral and topicals do not help.   Return in about 4 weeks (around 11/03/2019) for psoriasis.  Graciella Belton, RMA, am acting as scribe for Sarina Ser, MD . Documentation: I have reviewed the above documentation for accuracy and completeness, and I agree with the above.  Sarina Ser, MD

## 2019-10-06 NOTE — Patient Instructions (Addendum)
Side effects of Otezla (apremilast) include diarrhea, nausea, headache, upper respiratory infection, depression, and weight decrease (5-10%). It should only be taken by pregnant women after a discussion regarding risks and benefits with their doctor.  Recommend using [employer] daily, leave on for 1-2 minutes before rinsing off. This can be purchased at [employer] or online.

## 2019-10-10 ENCOUNTER — Encounter: Payer: Self-pay | Admitting: Dermatology

## 2019-10-10 NOTE — Progress Notes (Deleted)
Established patient visit   Patient: Ann Morales   DOB: [birth year]   44 y.o. Female  MRN: [id number] Visit Date: 10/12/2019  Today's healthcare provider: Trinna Post, PA-C   No chief complaint on file.  Subjective    HPI  *** History of migraine headaches - 08/12/19 Patient reports she has migraine headaches 2-3 times a week and would like another medication. Patient was started on Propranolol 40 MG as below. {Show patient history (optional):23778::" "}   Medications: Outpatient Medications Prior to Visit  Medication Sig  . albuterol (VENTOLIN HFA) 108 (90 Base) MCG/ACT inhaler Inhale 2 puffs into the lungs every 6 (six) hours as needed for wheezing or shortness of breath.  . amphetamine-dextroamphetamine (ADDERALL) 15 MG tablet Take 15 mg by mouth daily at 2 PM.   . buPROPion (WELLBUTRIN XL) 150 MG 24 hr tablet Take 150 mg by mouth every morning.   . clobetasol cream (TEMOVATE) 1.44 % Apply 1 application topically 2 (two) times daily. Up to 5 days per week  . clonazePAM (KLONOPIN) 0.5 MG tablet Take 1 tablet (0.5 mg total) by mouth 2 (two) times daily as needed for anxiety. (Patient taking differently: Take 0.5 mg by mouth 2 (two) times daily. )  . DOVONEX 0.005 % cream Apply topically 2 (two) times daily.  . fesoterodine (TOVIAZ) 8 MG TB24 tablet Take 1 tablet (8 mg total) by mouth daily. (Patient taking differently: Take 8 mg by mouth every morning. )  . ibuprofen (ADVIL) 800 MG tablet Take 1 tablet (800 mg total) by mouth every 8 (eight) hours as needed.  [name] Kitchen ipratropium (ATROVENT) 0.03 % nasal spray Place 2 sprays into both nostrils 3 (three) times daily as needed for rhinitis.  [name] Kitchen LATUDA 40 MG TABS tablet Take 40 mg by mouth daily after supper.   . lisdexamfetamine (VYVANSE) 40 MG capsule Take 40 mg by mouth in the morning.   . lithium 300 MG tablet Take 300 mg by mouth 2 (two) times daily.  [name] Kitchen loratadine (CLARITIN) 10 MG tablet One daily as needed for allergies  (Patient taking differently: Take 10 mg by mouth daily as needed for allergies. )  . mirabegron ER (MYRBETRIQ) 25 MG TB24 tablet Take 1 tablet (25 mg total) by mouth daily.  [name] Kitchen neomycin-polymyxin-hydrocortisone (CORTISPORIN) OTIC solution Place 4 drops into the right ear 4 (four) times daily.  [name] Kitchen omeprazole (PRILOSEC) 20 MG capsule Take 1 capsule (20 mg total) by mouth daily. (Patient taking differently: Take 20 mg by mouth every morning. )  . propranolol (INDERAL) 40 MG tablet Take 1 tablet (40 mg total) by mouth 2 (two) times daily.  . SUMAtriptan (IMITREX) 50 MG tablet TAKE ONE TABLET AT ONSET OF HEADACHE MAY REPEAT IN 2 HOURS IF NOT BETTER  . traZODone (DESYREL) 100 MG tablet Take 150 mg by mouth at bedtime.  . TRINTELLIX 10 MG TABS Take 10 mg by mouth every morning.    No facility-administered medications prior to visit.    Review of Systems  {Heme  Chem  Endocrine  Serology  Results Review (optional):23779::" "}  Objective    There were no vitals taken for this visit. {Show previous vital signs (optional):23777::" "}  Physical Exam  ***  No results found for any visits on 10/12/19.  Assessment & Plan     ***  No follow-ups on file.      {provider attestation***:1}   Paulene Floor  [facility] [phone] (phone) [phone] (fax)  [place]

## 2019-10-11 NOTE — Progress Notes (Signed)
Established patient visit   Patient: Ann Morales   DOB: 10-21-1975   44 y.o. Female  MRN: [id number] Visit Date: 10/13/2019  Today's healthcare provider: Trinna Post, PA-C   Chief Complaint  Patient presents with  . Migraine   Subjective    HPI   Patient says that the propranolol has not been helping with her headaches, she said that she is still having them.   Patient would like to know if she could try Topamax. She is actively being treated with mood stabilizers by Rushie Chestnut, NP at [facility]  Patient has stopped taking Propranolol.   History of migraine headaches- 08/12/19 Patient reports she has migraine headaches 2-3 times a week and would like another medication. Patient was started on Propranolol 40 MG as below. Reports these were not helpful.     Medications: Outpatient Medications Prior to Visit  Medication Sig  . albuterol (VENTOLIN HFA) 108 (90 Base) MCG/ACT inhaler Inhale 2 puffs into the lungs every 6 (six) hours as needed for wheezing or shortness of breath.  . amphetamine-dextroamphetamine (ADDERALL) 15 MG tablet Take 15 mg by mouth daily at 2 PM.   . buPROPion (WELLBUTRIN XL) 150 MG 24 hr tablet Take 150 mg by mouth every morning.   . clobetasol cream (TEMOVATE) 9.56 % Apply 1 application topically 2 (two) times daily. Up to 5 days per week  . clonazePAM (KLONOPIN) 0.5 MG tablet Take 1 tablet (0.5 mg total) by mouth 2 (two) times daily as needed for anxiety. (Patient taking differently: Take 0.5 mg by mouth 2 (two) times daily. )  . DOVONEX 0.005 % cream Apply topically 2 (two) times daily.  . fesoterodine (TOVIAZ) 8 MG TB24 tablet Take 1 tablet (8 mg total) by mouth daily. (Patient taking differently: Take 8 mg by mouth every morning. )  . ibuprofen (ADVIL) 800 MG tablet Take 1 tablet (800 mg total) by mouth every 8 (eight) hours as needed.  [name] Kitchen ipratropium (ATROVENT) 0.03 % nasal spray Place 2 sprays into both nostrils 3 (three)  times daily as needed for rhinitis.  [name] Kitchen LATUDA 40 MG TABS tablet Take 40 mg by mouth daily after supper.   . lisdexamfetamine (VYVANSE) 40 MG capsule Take 40 mg by mouth in the morning.   . lithium 300 MG tablet Take 300 mg by mouth 2 (two) times daily.  [name] Kitchen loratadine (CLARITIN) 10 MG tablet One daily as needed for allergies (Patient taking differently: Take 10 mg by mouth daily as needed for allergies. )  . mirabegron ER (MYRBETRIQ) 25 MG TB24 tablet Take 1 tablet (25 mg total) by mouth daily.  [name] Kitchen omeprazole (PRILOSEC) 20 MG capsule Take 1 capsule (20 mg total) by mouth daily. (Patient taking differently: Take 20 mg by mouth every morning. )  . propranolol (INDERAL) 40 MG tablet Take 1 tablet (40 mg total) by mouth 2 (two) times daily.  . SUMAtriptan (IMITREX) 50 MG tablet TAKE ONE TABLET AT ONSET OF HEADACHE MAY REPEAT IN 2 HOURS IF NOT BETTER  . traZODone (DESYREL) 100 MG tablet Take 150 mg by mouth at bedtime.  . TRINTELLIX 10 MG TABS Take 10 mg by mouth every morning.   . neomycin-polymyxin-hydrocortisone (CORTISPORIN) OTIC solution Place 4 drops into the right ear 4 (four) times daily. (Patient not taking: Reported on 10/13/2019)   No facility-administered medications prior to visit.    Review of Systems    Objective    BP 122/87 (BP Location: Left Wrist,  Patient Position: Sitting, Cuff Size: Normal)   Pulse 86   Temp 98.9 F (37.2 C) (Temporal)   Wt 236 lb 12.8 oz (107.4 kg)   BMI 37.09 kg/m    Physical Exam Vitals reviewed.  Constitutional:      Appearance: Normal appearance.  HENT:     Head: Normocephalic and atraumatic.     Right Ear: External ear normal.     Left Ear: External ear normal.  Eyes:     General: No scleral icterus.    Conjunctiva/sclera: Conjunctivae normal.  Cardiovascular:     Rate and Rhythm: Normal rate and regular rhythm.     Pulses: Normal pulses.     Heart sounds: Normal heart sounds.  Pulmonary:     Effort: Pulmonary effort is normal.      Breath sounds: Normal breath sounds.  Musculoskeletal:     Right lower leg: No edema.     Left lower leg: No edema.  Skin:    General: Skin is warm and dry.  Neurological:     General: No focal deficit present.     Mental Status: She is alert and oriented to person, place, and time.  Psychiatric:        Mood and Affect: Mood normal.        Behavior: Behavior normal.        Thought Content: Thought content normal.        Judgment: Judgment normal.       No results found for any visits on 10/13/19.  Assessment & Plan    1. History of migraine headaches  Discussed starting topamax with psychiatry provider and received OK. Start topamax 25 mg QHS and follow up one months.   2. Bipolar I disorder, current or most recent episode depressed, with psychotic features ([place])   Return if symptoms worsen or fail to improve.      ITrinna Post, PA-C, have reviewed all documentation for this visit. The documentation on 11/08/19 for the exam, diagnosis, procedures, and orders are all accurate and complete.  The entirety of the information documented in the History of Present Illness, Review of Systems and Physical Exam were personally obtained by me. Portions of this information were initially documented by Elonda Husky, CMA and reviewed by me for thoroughness and accuracy.     Paulene Floor  [facility] [phone] (phone) [phone] (fax)  [place]

## 2019-10-12 ENCOUNTER — Ambulatory Visit: Payer: Medicaid Other | Admitting: Dermatology

## 2019-10-12 ENCOUNTER — Ambulatory Visit: Payer: Medicaid Other | Admitting: Physician Assistant

## 2019-10-13 ENCOUNTER — Other Ambulatory Visit: Payer: Self-pay

## 2019-10-13 ENCOUNTER — Ambulatory Visit: Payer: Medicaid Other | Admitting: Physician Assistant

## 2019-10-13 ENCOUNTER — Encounter: Payer: Self-pay | Admitting: Physician Assistant

## 2019-10-13 VITALS — BP 122/87 | HR 86 | Temp 98.9°F | Wt 236.8 lb

## 2019-10-13 DIAGNOSIS — F315 Bipolar disorder, current episode depressed, severe, with psychotic features: Secondary | ICD-10-CM | POA: Diagnosis not present

## 2019-10-13 DIAGNOSIS — Z8669 Personal history of other diseases of the nervous system and sense organs: Secondary | ICD-10-CM | POA: Diagnosis not present

## 2019-10-13 NOTE — Assessment & Plan Note (Signed)
Discuss Topamax with Psychiatrist Discontinued Propranolol Consider referral to Neurology in the future

## 2019-10-17 ENCOUNTER — Encounter: Payer: Self-pay | Admitting: Dermatology

## 2019-10-17 ENCOUNTER — Telehealth: Payer: Self-pay | Admitting: Physician Assistant

## 2019-10-17 DIAGNOSIS — G43809 Other migraine, not intractable, without status migrainosus: Secondary | ICD-10-CM

## 2019-10-17 NOTE — Telephone Encounter (Signed)
Patient requesting to speak with clinical staff regarding "topramax". She states Dr. Concha Pyo advised her it was okay to take and to speak with [name], PA about sending it into pharmacy and managing it.

## 2019-10-18 ENCOUNTER — Other Ambulatory Visit: Payer: Self-pay

## 2019-10-18 DIAGNOSIS — L409 Psoriasis, unspecified: Secondary | ICD-10-CM

## 2019-10-18 MED ORDER — CLOBETASOL PROPIONATE 0.05 % EX CREA: 1.0000 "application " | TOPICAL_CREAM | Freq: Two times a day (BID) | CUTANEOUS | 0 refills | Status: DC

## 2019-10-18 MED ORDER — DOVONEX 0.005 % EX CREA: TOPICAL_CREAM | Freq: Two times a day (BID) | CUTANEOUS | 2 refills | Status: DC

## 2019-10-18 NOTE — Telephone Encounter (Signed)
Spoke with patient about headaches and she thinks they could possibly be related to the heat. She suffers from migraines and does take Imitrex and ibuprofen. She has been taking [place] one a day and will continue. Advised patient if needed she can take 1/2 pill to see if that helps with headaches. Refills for her topicals sent in and patient advised, JS

## 2019-10-18 NOTE — Telephone Encounter (Signed)
Please review. Thanks!  

## 2019-10-20 ENCOUNTER — Other Ambulatory Visit: Payer: Self-pay

## 2019-10-20 DIAGNOSIS — L409 Psoriasis, unspecified: Secondary | ICD-10-CM

## 2019-10-20 NOTE — Progress Notes (Signed)
ERROR

## 2019-10-24 MED ORDER — TOPIRAMATE 25 MG PO TABS: 25.0000 mg | ORAL_TABLET | Freq: Every day | ORAL | 0 refills | Status: DC

## 2019-10-24 NOTE — Telephone Encounter (Signed)
Sent her in topamax 25 mg QHS #90 for migraine. Please have her schedule follow up in 1 month. Thanks.

## 2019-10-25 NOTE — Telephone Encounter (Signed)
Advised pt.  Appt scheduled.

## 2019-11-02 ENCOUNTER — Ambulatory Visit: Payer: Medicaid Other | Admitting: Dermatology

## 2019-11-28 ENCOUNTER — Ambulatory Visit: Payer: Self-pay | Admitting: Physician Assistant

## 2019-12-05 NOTE — Progress Notes (Signed)
Established patient visit   Patient: Ann Morales   DOB: [birth year]   44 y.o. Female  MRN: [id number] Visit Date: 12/06/2019  Today's healthcare provider: Trinna Post, PA-C   Chief Complaint  Patient presents with  . Migraine  I,Aziah Brostrom M Lonisha Bobby,acting as a scribe for Trinna Post, PA-C.,have documented all relevant documentation on the behalf of Trinna Post, PA-C,as directed by  Trinna Post, PA-C while in the presence of Trinna Post, PA-C.  Subjective    HPI  Follow up for Migraine  The patient was last seen for this 1 months ago. Changes made at last visit include started on topamax 25 mg QHS.  She reports good compliance with treatment. She feels that condition is improved but would like to increase dose.  She is not having side effects.   -----------------------------------------------------------------------------------------  Follow up for pain and swelling of ankles  The patient was last seen for this 3 months ago. Changes made at last visit include none.  She reports fair compliance with treatment. Patient reports she was wearing compression stocks. She has history of varicose veins that were treated by Dr. Delana Meyer at [facility]. Reports itching, pain.  She feels that condition is Worse.  Patient reports fatigue for a month now. She has sleep apnea. She was diagnosed with sleep apnea in 2017, ideal setting 12 cm H2O. She reports having little energy at times. She reports her machine broke.  Reports poor sleep quality and daytime fatigue/somnolence.   BP Readings from Last 3 Encounters:  12/06/19 99/74  10/13/19 122/87  08/26/19 120/74    -----------------------------------------------------------------------------------------         Medications: Outpatient Medications Prior to Visit  Medication Sig  . albuterol (VENTOLIN HFA) 108 (90 Base) MCG/ACT inhaler Inhale 2 puffs into the lungs every 6 (six)  hours as needed for wheezing or shortness of breath.  . amphetamine-dextroamphetamine (ADDERALL) 15 MG tablet Take 15 mg by mouth daily at 2 PM.   . buPROPion (WELLBUTRIN XL) 150 MG 24 hr tablet Take 150 mg by mouth every morning.   . clobetasol cream (TEMOVATE) 0.97 % Apply 1 application topically 2 (two) times daily. Up to 5 days per week  . clonazePAM (KLONOPIN) 0.5 MG tablet Take 1 tablet (0.5 mg total) by mouth 2 (two) times daily as needed for anxiety. (Patient taking differently: Take 0.5 mg by mouth 2 (two) times daily. )  . DOVONEX 0.005 % cream Apply topically 2 (two) times daily.  . fesoterodine (TOVIAZ) 8 MG TB24 tablet Take 1 tablet (8 mg total) by mouth daily. (Patient taking differently: Take 8 mg by mouth every morning. )  . ibuprofen (ADVIL) 800 MG tablet Take 1 tablet (800 mg total) by mouth every 8 (eight) hours as needed.  [name] Kitchen ipratropium (ATROVENT) 0.03 % nasal spray Place 2 sprays into both nostrils 3 (three) times daily as needed for rhinitis.  [name] Kitchen LATUDA 40 MG TABS tablet Take 40 mg by mouth daily after supper.   . lisdexamfetamine (VYVANSE) 40 MG capsule Take 40 mg by mouth in the morning.   . lithium 300 MG tablet Take 300 mg by mouth 2 (two) times daily.  [name] Kitchen loratadine (CLARITIN) 10 MG tablet One daily as needed for allergies (Patient taking differently: Take 10 mg by mouth daily as needed for allergies. )  . mirabegron ER (MYRBETRIQ) 25 MG TB24 tablet Take 1 tablet (25 mg total) by mouth daily.  [name] Kitchen neomycin-polymyxin-hydrocortisone (CORTISPORIN)  OTIC solution Place 4 drops into the right ear 4 (four) times daily.  [name] Kitchen omeprazole (PRILOSEC) 20 MG capsule Take 1 capsule (20 mg total) by mouth daily. (Patient taking differently: Take 20 mg by mouth every morning. )  . SUMAtriptan (IMITREX) 50 MG tablet TAKE ONE TABLET AT ONSET OF HEADACHE MAY REPEAT IN 2 HOURS IF NOT BETTER  . traZODone (DESYREL) 100 MG tablet Take 150 mg by mouth at bedtime.  . TRINTELLIX 10 MG TABS Take 10 mg by  mouth every morning.   . [DISCONTINUED] propranolol (INDERAL) 40 MG tablet Take 1 tablet (40 mg total) by mouth 2 (two) times daily.  . [DISCONTINUED] topiramate (TOPAMAX) 25 MG tablet Take 1 tablet (25 mg total) by mouth at bedtime.   No facility-administered medications prior to visit.    Review of Systems  Constitutional: Negative.   Cardiovascular: Positive for leg swelling.  Gastrointestinal: Negative.   Neurological: Positive for headaches.      Objective    BP 99/74   Pulse 69   Temp 97.7 F (36.5 C) (Oral)   Wt 235 lb 9.6 oz (106.9 kg)   SpO2 96%   BMI 36.90 kg/m    Physical Exam Constitutional:      Appearance: Normal appearance. She is normal weight.  Musculoskeletal:     Right lower leg: Edema present.     Left lower leg: Swelling and tenderness present. Edema present.     Left ankle: Swelling present. Tenderness present.     Comments: Significant varicosities are present in both legs, but left worse than right.   Skin:    General: Skin is warm and dry.  Neurological:     General: No focal deficit present.     Mental Status: She is alert and oriented to person, place, and time.  Psychiatric:        Mood and Affect: Mood normal.        Behavior: Behavior normal.       No results found for any visits on 12/06/19.  Assessment & Plan    1. Other migraine without status migrainosus, not intractable  Increase topamax to 50 mg QHS. Follow up one year at minimum, sooner if migraines uncontrolled.  - topiramate (TOPAMAX) 50 MG tablet; Take 1 tablet (50 mg total) by mouth at bedtime.  Dispense: 90 tablet; Refill: 0  2. Varicose veins of both lower extremities with inflammation  - Ambulatory referral to Vascular Surgery  3. Sleep apnea, unspecified type  Will send order to [facility] for new CPAP.     Return in about 1 year (around 12/05/2020).         Paulene Floor  [facility] [phone] (phone) [phone]  (fax)  [place]

## 2019-12-06 ENCOUNTER — Ambulatory Visit (INDEPENDENT_AMBULATORY_CARE_PROVIDER_SITE_OTHER): Payer: Medicaid Other | Admitting: Physician Assistant

## 2019-12-06 ENCOUNTER — Telehealth: Payer: Self-pay | Admitting: Physician Assistant

## 2019-12-06 ENCOUNTER — Encounter: Payer: Self-pay | Admitting: Physician Assistant

## 2019-12-06 ENCOUNTER — Other Ambulatory Visit: Payer: Self-pay

## 2019-12-06 VITALS — BP 99/74 | HR 69 | Temp 97.7°F | Wt 235.6 lb

## 2019-12-06 DIAGNOSIS — G4730 Sleep apnea, unspecified: Secondary | ICD-10-CM | POA: Diagnosis not present

## 2019-12-06 DIAGNOSIS — I8311 Varicose veins of right lower extremity with inflammation: Secondary | ICD-10-CM | POA: Diagnosis not present

## 2019-12-06 DIAGNOSIS — G43809 Other migraine, not intractable, without status migrainosus: Secondary | ICD-10-CM

## 2019-12-06 DIAGNOSIS — I8312 Varicose veins of left lower extremity with inflammation: Secondary | ICD-10-CM

## 2019-12-06 MED ORDER — TOPIRAMATE 50 MG PO TABS: 50.0000 mg | ORAL_TABLET | Freq: Every day | ORAL | 0 refills | Status: DC

## 2019-12-06 NOTE — Telephone Encounter (Signed)
Can we send order to [facility] for cpap? We can print out her sleep study from 2017 and office note from today.

## 2019-12-06 NOTE — Patient Instructions (Signed)

## 2019-12-06 NOTE — Telephone Encounter (Signed)
Order was send to [facility].

## 2019-12-12 ENCOUNTER — Encounter (INDEPENDENT_AMBULATORY_CARE_PROVIDER_SITE_OTHER): Payer: Medicaid Other | Admitting: Nurse Practitioner

## 2019-12-16 ENCOUNTER — Other Ambulatory Visit: Payer: Self-pay | Admitting: Physician Assistant

## 2019-12-16 ENCOUNTER — Encounter (INDEPENDENT_AMBULATORY_CARE_PROVIDER_SITE_OTHER): Payer: Medicaid Other | Admitting: Nurse Practitioner

## 2019-12-16 DIAGNOSIS — R102 Pelvic and perineal pain: Secondary | ICD-10-CM

## 2019-12-16 MED ORDER — IBUPROFEN 800 MG PO TABS: 800.0000 mg | ORAL_TABLET | Freq: Three times a day (TID) | ORAL | 0 refills | Status: DC | PRN

## 2019-12-19 ENCOUNTER — Other Ambulatory Visit: Payer: Self-pay

## 2019-12-19 ENCOUNTER — Ambulatory Visit (INDEPENDENT_AMBULATORY_CARE_PROVIDER_SITE_OTHER): Payer: Medicaid Other | Admitting: Nurse Practitioner

## 2019-12-19 ENCOUNTER — Encounter (INDEPENDENT_AMBULATORY_CARE_PROVIDER_SITE_OTHER): Payer: Self-pay | Admitting: Nurse Practitioner

## 2019-12-19 VITALS — BP 105/72 | HR 68 | Resp 16 | Ht 67.0 in | Wt 241.0 lb

## 2019-12-19 DIAGNOSIS — I890 Lymphedema, not elsewhere classified: Secondary | ICD-10-CM | POA: Diagnosis not present

## 2019-12-19 DIAGNOSIS — I83813 Varicose veins of bilateral lower extremities with pain: Secondary | ICD-10-CM

## 2019-12-19 DIAGNOSIS — E785 Hyperlipidemia, unspecified: Secondary | ICD-10-CM

## 2019-12-22 ENCOUNTER — Encounter (INDEPENDENT_AMBULATORY_CARE_PROVIDER_SITE_OTHER): Payer: Self-pay | Admitting: Nurse Practitioner

## 2019-12-22 NOTE — Progress Notes (Signed)
Subjective:    Patient ID: Ann Morales, female    DOB: 19-Feb-1976, 44 y.o.   MRN: [id number] Chief Complaint  Patient presents with  . New Patient (Initial Visit)    Varicose veins    Ann Morales is a 44 year old female that presents today for evaluation of her varicose veins.  The patient is a previous patient of this practice.  She had an endovenous laser ablation done on her left lower extremity in 2018.  The ablation at that time was successful and the patient also had several sessions of sclerotherapy.  Recently the patient noticed a new larger vein that has developed within the area of her thigh across her knee and down her calf.  This area is sore and tender to the touch especially as she stands and as the day progresses.  The patient has continued to utilize medical grade 1 compression stockings.  Elevation does help the pain and discomfort within the varicose vein.  The patient also endorses having some worsening swelling in the left lower extremity.  She denies any fever, chills, nausea, vomiting or diarrhea.  She denies any chest pain or shortness of breath.   Review of Systems  Cardiovascular: Positive for leg swelling.  All other systems reviewed and are negative.      Objective:   Physical Exam Vitals reviewed.  HENT:     Head: Normocephalic.  Cardiovascular:     Rate and Rhythm: Normal rate and regular rhythm.     Pulses: Normal pulses.     Comments: Large palpable varicosity left lower extremity Pulmonary:     Effort: Pulmonary effort is normal.  Musculoskeletal:     Right lower leg: Edema present.     Left lower leg: Edema present.  Skin:    General: Skin is warm and dry.  Neurological:     Mental Status: She is alert and oriented to person, place, and time.  Psychiatric:        Mood and Affect: Mood normal.        Behavior: Behavior normal.        Thought Content: Thought content normal.        Judgment: Judgment normal.     BP 105/72 (BP  Location: Right Arm)   Pulse 68   Resp 16   Ht 5\' 7"  (1.702 m)   Wt 241 lb (109.3 kg)   BMI 37.75 kg/m   Past Medical History:  Diagnosis Date  . ADHD (attention deficit hyperactivity disorder)   . Anxiety   . Arthritis   . Asthma    well controlled  . Bipolar disorder ([place])   . Depression   . GERD (gastroesophageal reflux disease)   . Headache    migraines  . History of kidney stones    currently as of 05-30-19  . Sleep apnea    cpap    Social History   Socioeconomic History  . Marital status: Single    Spouse name: Not on file  . Number of children: Not on file  . Years of education: Not on file  . Highest education level: Not on file  Occupational History  . Not on file  Tobacco Use  . Smoking status: Current Every Day Smoker    Packs/day: 2.00    Years: 15.00    Pack years: 30.00    Types: Cigarettes    Start date: 09/25/1994  . Smokeless tobacco: Never Used  Vaping Use  . Vaping Use: Never used  Substance and Sexual Activity  . Alcohol use: No    Alcohol/week: 0.0 standard drinks  . Drug use: No  . Sexual activity: Yes    Birth control/protection: Surgical  Other Topics Concern  . Not on file  Social History Narrative  . Not on file   Social Determinants of Health   Financial Resource Strain:   . Difficulty of Paying Living Expenses: Not on file  Food Insecurity:   . Worried About Charity fundraiser in the Last Year: Not on file  . Ran Out of Food in the Last Year: Not on file  Transportation Needs:   . Lack of Transportation (Medical): Not on file  . Lack of Transportation (Non-Medical): Not on file  Physical Activity:   . Days of Exercise per Week: Not on file  . Minutes of Exercise per Session: Not on file  Stress:   . Feeling of Stress : Not on file  Social Connections:   . Frequency of Communication with Friends and Family: Not on file  . Frequency of Social Gatherings with Friends and Family: Not on file  . Attends Religious  Services: Not on file  . Active Member of Clubs or Organizations: Not on file  . Attends Archivist Meetings: Not on file  . Marital Status: Not on file  Intimate Partner Violence:   . Fear of Current or Ex-Partner: Not on file  . Emotionally Abused: Not on file  . Physically Abused: Not on file  . Sexually Abused: Not on file    Past Surgical History:  Procedure Laterality Date  . BACK SURGERY  03/2017   cervical fusion  . CHOLECYSTECTOMY    . GALLBLADDER SURGERY    . HYSTEROSCOPY WITH NOVASURE N/A 06/02/2019   Procedure: DILATATION & HYSTEROSCOPY WITH NOVASURE ABLATION;  Surgeon: Malachy Mood, MD;  Location: [facility];  Service: Gynecology;  Laterality: N/A;  . LAPAROSCOPY N/A 12/21/2018   Procedure: LAPAROSCOPY DIAGNOSTIC;  Surgeon: Will Bonnet, MD;  Location: [facility];  Service: Gynecology;  Laterality: N/A;  . PLANTAR FASCIA RELEASE Left 06/12/2017   Procedure: ENDOSCOPIC PLANTAR FASCIOTOMY-RELEASE;  Surgeon: Samara Deist, DPM;  Location: [facility];  Service: Podiatry;  Laterality: Left;  . REPAIR EXTENSOR TENDON Left 06/12/2017   Procedure: REPAIR FLEXOR TENDON;  Surgeon: Samara Deist, DPM;  Location: [facility];  Service: Podiatry;  Laterality: Left;  . TARSAL TUNNEL RELEASE Left 06/12/2017   Procedure: TARSAL TUNNEL RELEASE-BAXTER RELEASE;  Surgeon: Samara Deist, DPM;  Location: [facility];  Service: Podiatry;  Laterality: Left;  . TUBAL LIGATION      Family History  Problem Relation Age of Onset  . Hypercholesterolemia Father   . Bipolar disorder Mother   . Hypercholesterolemia Maternal Grandmother   . Hypertension Maternal Grandmother   . Anxiety disorder Maternal Grandmother   . Depression Maternal Grandmother   . Ovarian cancer Maternal Grandmother   . Colon cancer Maternal Grandfather 11  . Ovarian cancer Paternal Grandmother     Allergies  Allergen Reactions  . Codeine Hives and Shortness Of Breath  . Azithromycin Diarrhea  . Sulfa  Antibiotics Other (See Comments)    Severe abdominal pain.       Assessment & Plan:   1. Varicose veins of bilateral lower extremities with pain We will have the patient return to the office for noninvasive studies to evaluate the status of her left great saphenous vein.  While the patient has had a previously successful endovenous laser ablation, there are  new painful varicosities that have developed.  We will evaluate for the presence of a recannulized great saphenous vein or possible anterior accessory saphenous vein.  In the interim the patient will continue to wear medical grade 1 compression stockings, elevating her lower extremities and exercise as possible.  2. Hyperlipidemia, unspecified hyperlipidemia type Continue statin as ordered and reviewed, no changes at this time   3. Lymphedema I have had a long discussion with the patient regarding swelling and why it  causes symptoms.  Patient will begin wearing graduated compression stockings class 1 (20-30 mmHg) on a daily basis a prescription was given. The patient will  beginning wearing the stockings first thing in the morning and removing them in the evening. The patient is instructed specifically not to sleep in the stockings.   In addition, behavioral modification will be initiated.  This will include frequent elevation, use of over the counter pain medications and exercise such as walking.  I have reviewed systemic causes for chronic edema such as liver, kidney and cardiac etiologies.  The patient denies problems with these organ systems.    Consideration for a lymph pump will also be made based upon the effectiveness of conservative therapy.  This would help to improve the edema control and prevent sequela such as ulcers and infections   Patient should undergo duplex ultrasound of the venous system to ensure that DVT or reflux is not present.  The patient will follow-up with me after the ultrasound.     Current Outpatient  Medications on File Prior to Visit  Medication Sig Dispense Refill  . albuterol (VENTOLIN HFA) 108 (90 Base) MCG/ACT inhaler Inhale 2 puffs into the lungs every 6 (six) hours as needed for wheezing or shortness of breath. 18 g 0  . amphetamine-dextroamphetamine (ADDERALL) 15 MG tablet Take 15 mg by mouth daily at 2 PM.   0  . buPROPion (WELLBUTRIN XL) 150 MG 24 hr tablet Take 150 mg by mouth every morning.   1  . clobetasol cream (TEMOVATE) 1.74 % Apply 1 application topically 2 (two) times daily. Up to 5 days per week 30 g 0  . clonazePAM (KLONOPIN) 0.5 MG tablet Take 1 tablet (0.5 mg total) by mouth 2 (two) times daily as needed for anxiety. (Patient taking differently: Take 0.5 mg by mouth 2 (two) times daily. ) 60 tablet 3  . DOVONEX 0.005 % cream Apply topically 2 (two) times daily. 60 g 2  . ibuprofen (ADVIL) 800 MG tablet Take 1 tablet (800 mg total) by mouth every 8 (eight) hours as needed. 30 tablet 0  . ipratropium (ATROVENT) 0.03 % nasal spray Place 2 sprays into both nostrils 3 (three) times daily as needed for rhinitis. 30 mL 0  . LATUDA 40 MG TABS tablet Take 40 mg by mouth daily after supper.   2  . lisdexamfetamine (VYVANSE) 40 MG capsule Take 40 mg by mouth in the morning.     . lithium 300 MG tablet Take 300 mg by mouth 2 (two) times daily.  2  . loratadine (CLARITIN) 10 MG tablet One daily as needed for allergies (Patient taking differently: Take 10 mg by mouth daily as needed for allergies. ) 30 tablet 5  . omeprazole (PRILOSEC) 20 MG capsule Take 1 capsule (20 mg total) by mouth daily. (Patient taking differently: Take 20 mg by mouth every morning. ) 90 capsule 1  . SUMAtriptan (IMITREX) 50 MG tablet TAKE ONE TABLET AT ONSET OF HEADACHE MAY REPEAT IN 2  HOURS IF NOT BETTER 10 tablet 0  . topiramate (TOPAMAX) 50 MG tablet Take 1 tablet (50 mg total) by mouth at bedtime. 90 tablet 0  . traZODone (DESYREL) 100 MG tablet Take 150 mg by mouth at bedtime.    . TRINTELLIX 10 MG TABS  Take 10 mg by mouth every morning.   2  . chlorhexidine (PERIDEX) 0.12 % solution SMARTSIG:By Mouth    . fesoterodine (TOVIAZ) 8 MG TB24 tablet Take 1 tablet (8 mg total) by mouth daily. (Patient not taking: Reported on 12/19/2019) 30 tablet 0  . mirabegron ER (MYRBETRIQ) 25 MG TB24 tablet Take 1 tablet (25 mg total) by mouth daily. 90 tablet 0  . neomycin-polymyxin-hydrocortisone (CORTISPORIN) OTIC solution Place 4 drops into the right ear 4 (four) times daily. 10 mL 0  . oxyCODONE-acetaminophen (PERCOCET/ROXICET) 5-325 MG tablet Take 1 tablet by mouth every 6 (six) hours as needed.     No current facility-administered medications on file prior to visit.    There are no Patient Instructions on file for this visit. No follow-ups on file.   Kris Hartmann, NP

## 2019-12-26 ENCOUNTER — Other Ambulatory Visit (INDEPENDENT_AMBULATORY_CARE_PROVIDER_SITE_OTHER): Payer: Self-pay | Admitting: Nurse Practitioner

## 2019-12-26 DIAGNOSIS — Z9889 Other specified postprocedural states: Secondary | ICD-10-CM

## 2019-12-26 DIAGNOSIS — I83812 Varicose veins of left lower extremities with pain: Secondary | ICD-10-CM

## 2019-12-27 ENCOUNTER — Ambulatory Visit: Payer: Medicaid Other | Admitting: Obstetrics and Gynecology

## 2019-12-28 ENCOUNTER — Ambulatory Visit (INDEPENDENT_AMBULATORY_CARE_PROVIDER_SITE_OTHER): Payer: Medicaid Other | Admitting: Nurse Practitioner

## 2019-12-28 ENCOUNTER — Encounter (INDEPENDENT_AMBULATORY_CARE_PROVIDER_SITE_OTHER): Payer: Medicaid Other

## 2019-12-29 ENCOUNTER — Ambulatory Visit: Payer: Medicaid Other | Admitting: Dermatology

## 2020-01-04 ENCOUNTER — Ambulatory Visit: Payer: Medicaid Other | Admitting: Dermatology

## 2020-01-11 ENCOUNTER — Telehealth: Payer: Medicaid Other | Admitting: Family

## 2020-01-11 DIAGNOSIS — J069 Acute upper respiratory infection, unspecified: Secondary | ICD-10-CM

## 2020-01-11 MED ORDER — FLUTICASONE PROPIONATE 50 MCG/ACT NA SUSP: 2.0000 | Freq: Every day | NASAL | 6 refills | Status: DC

## 2020-01-11 MED ORDER — BENZONATATE 100 MG PO CAPS: 100.0000 mg | ORAL_CAPSULE | Freq: Three times a day (TID) | ORAL | 0 refills | Status: DC | PRN

## 2020-01-11 NOTE — Progress Notes (Signed)
We are sorry you are not feeling well.  Here is how we plan to help!  Based on what you have shared with me, it looks like you may have a viral upper respiratory infection.  Upper respiratory infections are caused by a large number of viruses; however, rhinovirus is the most common cause.   You also need to be tested for COVID to rule this out.   Symptoms vary from person to person, with common symptoms including sore throat, cough, fatigue or lack of energy and feeling of general discomfort.  A low-grade fever of up to 100.4 may present, but is often uncommon.  Symptoms vary however, and are closely related to a person's age or underlying illnesses.  The most common symptoms associated with an upper respiratory infection are nasal discharge or congestion, cough, sneezing, headache and pressure in the ears and face.  These symptoms usually persist for about 3 to 10 days, but can last up to 2 weeks.  It is important to know that upper respiratory infections do not cause serious illness or complications in most cases.    Upper respiratory infections can be transmitted from person to person, with the most common method of transmission being a person's hands.  The virus is able to live on the skin and can infect other persons for up to 2 hours after direct contact.  Also, these can be transmitted when someone coughs or sneezes; thus, it is important to cover the mouth to reduce this risk.  To keep the spread of the illness at [place], good hand hygiene is very important.  This is an infection that is most likely caused by a virus. There are no specific treatments other than to help you with the symptoms until the infection runs its course.  We are sorry you are not feeling well.  Here is how we plan to help!   For nasal congestion, you may use an oral decongestants such as Mucinex D or if you have glaucoma or high blood pressure use plain Mucinex.  Saline nasal spray or nasal drops can help and can safely be  used as often as needed for congestion.  For your congestion, I have prescribed Fluticasone nasal spray one spray in each nostril twice a day  If you do not have a history of heart disease, hypertension, diabetes or thyroid disease, prostate/bladder issues or glaucoma, you may also use Sudafed to treat nasal congestion.  It is highly recommended that you consult with a pharmacist or your primary care physician to ensure this medication is safe for you to take.     If you have a cough, you may use cough suppressants such as Delsym and Robitussin.  If you have glaucoma or high blood pressure, you can also use Coricidin HBP.   For cough I have prescribed for you A prescription cough medication called Tessalon Perles 100 mg. You may take 1-2 capsules every 8 hours as needed for cough  If you have a sore or scratchy throat, use a saltwater gargle-  to  teaspoon of salt dissolved in a 4-ounce to 8-ounce glass of warm water.  Gargle the solution for approximately 15-30 seconds and then spit.  It is important not to swallow the solution.  You can also use throat lozenges/cough drops and Chloraseptic spray to help with throat pain or discomfort.  Warm or cold liquids can also be helpful in relieving throat pain.  For headache, pain or general discomfort, you can use Ibuprofen or Tylenol  as directed.   Some authorities believe that zinc sprays or the use of Echinacea may shorten the course of your symptoms.   HOME CARE . Only take medications as instructed by your medical team. . Be sure to drink plenty of fluids. Water is fine as well as fruit juices, sodas and electrolyte beverages. You may want to stay away from caffeine or alcohol. If you are nauseated, try taking small sips of liquids. How do you know if you are getting enough fluid? Your urine should be a pale yellow or almost colorless. . Get rest. . Taking a steamy shower or using a humidifier may help nasal congestion and ease sore throat pain. You  can place a towel over your head and breathe in the steam from hot water coming from a faucet. . Using a saline nasal spray works much the same way. . Cough drops, hard candies and sore throat lozenges may ease your cough. . Avoid close contacts especially the very young and the elderly . Cover your mouth if you cough or sneeze . Always remember to wash your hands.   GET HELP RIGHT AWAY IF: . You develop worsening fever. . If your symptoms do not improve within 10 days . You develop yellow or green discharge from your nose over 3 days. . You have coughing fits . You develop a severe head ache or visual changes. . You develop shortness of breath, difficulty breathing or start having chest pain . Your symptoms persist after you have completed your treatment plan  MAKE SURE YOU   Understand these instructions.  Will watch your condition.  Will get help right away if you are not doing well or get worse.  Your e-visit answers were reviewed by a board certified advanced clinical practitioner to complete your personal care plan. Depending upon the condition, your plan could have included both over the counter or prescription medications. Please review your pharmacy choice. If there is a problem, you may call our nursing hot line at and have the prescription routed to another pharmacy. Your safety is important to [place]. If you have drug allergies check your prescription carefully.   You can use MyChart to ask questions about today's visit, request a non-urgent call back, or ask for a work or school excuse for 24 hours related to this e-Visit. If it has been greater than 24 hours you will need to follow up with your provider, or enter a new e-Visit to address those concerns. You will get an e-mail in the next two days asking about your experience.  I hope that your e-visit has been valuable and will speed your recovery. Thank you for using e-visits.    Approximately 5 minutes was spent  documenting and reviewing patient's chart.

## 2020-01-13 ENCOUNTER — Telehealth: Payer: Medicaid Other | Admitting: Physician Assistant

## 2020-01-13 DIAGNOSIS — J069 Acute upper respiratory infection, unspecified: Secondary | ICD-10-CM

## 2020-01-13 MED ORDER — PREDNISONE 20 MG PO TABS: 40.0000 mg | ORAL_TABLET | Freq: Every day | ORAL | 0 refills | Status: DC

## 2020-01-13 MED ORDER — DOXYCYCLINE HYCLATE 100 MG PO CAPS: 100.0000 mg | ORAL_CAPSULE | Freq: Two times a day (BID) | ORAL | 0 refills | Status: DC

## 2020-01-13 NOTE — Progress Notes (Signed)
We are sorry that you are not feeling well.  Here is how we plan to help!  Based on your presentation I believe you most likely have A cough due to bacteria.  When patients have a fever and a productive cough with a change in color or increased sputum production, we are concerned about bacterial bronchitis.  If left untreated it can progress to pneumonia.  If your symptoms do not improve with your treatment plan it is important that you contact your provider.   I have prescribed Doxycycline 100 mg twice a day for 7 days     In addition you may use   Prednisone 40mg  per day x 5 days. Continue using the Tessalon Perles for cough and your Ventolin inhaler as directed.   From your responses in the eVisit questionnaire you describe inflammation in the upper respiratory tract which is causing a significant cough.  This is commonly called Bronchitis and has four common causes:    Allergies  Viral Infections  Acid Reflux  Bacterial Infection Allergies, viruses and acid reflux are treated by controlling symptoms or eliminating the cause. An example might be a cough caused by taking certain blood pressure medications. You stop the cough by changing the medication. Another example might be a cough caused by acid reflux. Controlling the reflux helps control the cough.  USE OF BRONCHODILATOR ("RESCUE") INHALERS: There is a risk from using your bronchodilator too frequently.  The risk is that over-reliance on a medication which only relaxes the muscles surrounding the breathing tubes can reduce the effectiveness of medications prescribed to reduce swelling and congestion of the tubes themselves.  Although you feel brief relief from the bronchodilator inhaler, your asthma may actually be worsening with the tubes becoming more swollen and filled with mucus.  This can delay other crucial treatments, such as oral steroid medications. If you need to use a bronchodilator inhaler daily, several times per day, you  should discuss this with your provider.  There are probably better treatments that could be used to keep your asthma under control.     HOME CARE . Only take medications as instructed by your medical team. . Complete the entire course of an antibiotic. . Drink plenty of fluids and get plenty of rest. . Avoid close contacts especially the very young and the elderly . Cover your mouth if you cough or cough into your sleeve. . Always remember to wash your hands . A steam or ultrasonic humidifier can help congestion.   GET HELP RIGHT AWAY IF: . You develop worsening fever. . You become short of breath . You cough up blood. . Your symptoms persist after you have completed your treatment plan MAKE SURE YOU   Understand these instructions.  Will watch your condition.  Will get help right away if you are not doing well or get worse.  Your e-visit answers were reviewed by a board certified advanced clinical practitioner to complete your personal care plan.  Depending on the condition, your plan could have included both over the counter or prescription medications. If there is a problem please reply  once you have received a response from your provider. Your safety is important to [place].  If you have drug allergies check your prescription carefully.    You can use MyChart to ask questions about today's visit, request a non-urgent call back, or ask for a work or school excuse for 24 hours related to this e-Visit. If it has been greater than 24 hours  you will need to follow up with your provider, or enter a new e-Visit to address those concerns. You will get an e-mail in the next two days asking about your experience.  I hope that your e-visit has been valuable and will speed your recovery. Thank you for using e-visits.  Greater than 5 minutes, yet less than 10 minutes of time have been spent researching, coordinating, and implementing care for this patient today

## 2020-01-16 ENCOUNTER — Other Ambulatory Visit: Payer: Self-pay

## 2020-01-16 ENCOUNTER — Ambulatory Visit: Payer: Medicaid Other | Admitting: Dermatology

## 2020-01-16 DIAGNOSIS — L409 Psoriasis, unspecified: Secondary | ICD-10-CM

## 2020-01-16 NOTE — Progress Notes (Signed)
   Follow-Up Visit   Subjective  Ann Morales is a 44 y.o. female who presents for the following: Psoriasis (Pt presents for 1 month f/u on Psoriasis fingers, elbows,legs, treating with Otezla tablets x 30 days with a poor response ).  The patient has been off of [place] for at least a month but nevertheless has had her psoriasis improved on topical treatment.  She feels that her psoriasis may improved with cooler weather and worsen with warmer weather.  The following portions of the chart were reviewed this encounter and updated as appropriate:  Tobacco  Allergies  Meds  Problems  Med Hx  Surg Hx  Fam Hx     Review of Systems:  No other skin or systemic complaints except as noted in HPI or Assessment and Plan.  Objective  Well appearing patient in no apparent distress; mood and affect are within normal limits.  A focused examination was performed including ears, fingers, elbows. Relevant physical exam findings are noted in the Assessment and Plan.  Objective  ears, elbows, fingers: Very thin plaques at ears, elbows    Assessment & Plan  Psoriasis -chronic; improving on topical treatment.  Continued Otezla as she did not feel it was helpful.  She feels the cooler weather tends to help her psoriasis and that may be contributory. ears, elbows, fingers Psoriasis Chronic, not currently at goal. Improved but not clear continue current treatment Clobetasol cream and Dovonex cream    Psoriasis is a chronic non-curable, but treatable genetic/hereditary disease that may have other systemic features affecting other organ systems such as joints (Psoriatic Arthritis). It is associated with an increased risk of inflammatory bowel disease, heart disease, non-alcoholic fatty liver disease, and depression.     D/C Otezla tablets Other Related Medications clobetasol cream (TEMOVATE) 0.05 % DOVONEX 0.005 % cream  Return in about 4 months (around 05/15/2020) for Psoriasis.  IMarye Round, CMA, am acting as scribe for Sarina Ser, MD .  Documentation: I have reviewed the above documentation for accuracy and completeness, and I agree with the above.  Sarina Ser, MD

## 2020-01-17 ENCOUNTER — Encounter: Payer: Self-pay | Admitting: Dermatology

## 2020-01-19 ENCOUNTER — Other Ambulatory Visit: Payer: Self-pay

## 2020-01-19 ENCOUNTER — Ambulatory Visit
Admission: EM | Admit: 2020-01-19 | Discharge: 2020-01-19 | Disposition: A | Discharge status: Home / Self Care | Payer: Medicaid Other | Attending: Emergency Medicine | Admitting: Emergency Medicine

## 2020-01-19 ENCOUNTER — Ambulatory Visit: Payer: Self-pay | Admitting: *Deleted

## 2020-01-19 DIAGNOSIS — H66001 Acute suppurative otitis media without spontaneous rupture of ear drum, right ear: Secondary | ICD-10-CM

## 2020-01-19 DIAGNOSIS — J069 Acute upper respiratory infection, unspecified: Secondary | ICD-10-CM

## 2020-01-19 MED ORDER — ALBUTEROL SULFATE HFA 108 (90 BASE) MCG/ACT IN AERS: 2.0000 | INHALATION_SPRAY | RESPIRATORY_TRACT | 0 refills | Status: DC | PRN

## 2020-01-19 MED ORDER — PREDNISONE 50 MG PO TABS: ORAL_TABLET | ORAL | 0 refills | Status: DC

## 2020-01-19 MED ORDER — ONDANSETRON 8 MG PO TBDP: 8.0000 mg | ORAL_TABLET | Freq: Three times a day (TID) | ORAL | 0 refills | Status: DC | PRN

## 2020-01-19 MED ORDER — AEROCHAMBER MV MISC: 2 refills | Status: DC

## 2020-01-19 MED ORDER — PROMETHAZINE-DM 6.25-15 MG/5ML PO SYRP: 5.0000 mL | ORAL_SOLUTION | Freq: Four times a day (QID) | ORAL | 0 refills | Status: DC | PRN

## 2020-01-19 MED ORDER — AMOXICILLIN-POT CLAVULANATE 875-125 MG PO TABS: 1.0000 | ORAL_TABLET | Freq: Two times a day (BID) | ORAL | 0 refills | Status: DC

## 2020-01-19 NOTE — Telephone Encounter (Signed)
FYI

## 2020-01-19 NOTE — ED Triage Notes (Signed)
Pt reports she was diagnosed with bronchitis last week. 2 more days of Prednisone but had to stop the ABX due to it making her sick. She reports she has been wheezing, coughing up green phlegm and blowing green from her nose.

## 2020-01-19 NOTE — Discharge Instructions (Addendum)
Increase your oral fluid intake to help keep your mucus thin.  Take the Augmentin twice daily with food for 10 days.  If you need to use the Zofran for nausea.  Use the albuterol inhaler, 2 puffs every 4 hours, with the spacer on a schedule for the next 2 days and then as needed.  Take the prednisone daily for the next 5 days.  Perform sinus irrigation with a [employer] sinus rinse kit and distilled water.  Do not use tap water.  Use the Promethazine DM as needed for cough and congestion.  Use this sparingly.  If your symptoms continue follow-up with your primary care provider.

## 2020-01-19 NOTE — Telephone Encounter (Signed)
Pt called with complaints of shortness of breath which started 3 days ago and has worsened; she has shortness of breath at rest; she has a productive cough with yellowish-greenish secretions; she was given prednisone and doxycycline; she stopped the antibiotic because it made her vomit. She stopped the antibiotic after 3 days and has a remaining dose of prednisone; the tessalon perles have not helped and her cough is severe; the pt had a telehealth visits on 01/11/20 and 01/13/20;the pt has not used her aluterol inhaler because she ran out of refills recommendations made per nurse triage; she sees [name] at [facility]; discussed with Delrae Alfred' pt advised to be evaluated at ED oir Urgent Care; she verbalized understanding; will route to office for notification.  Reason for Disposition  Patient sounds very sick or weak to the triager  Answer Assessment - Initial Assessment Questions 1. RESPIRATORY STATUS: "Describe your breathing?" (e.g., wheezing, shortness of breath, unable to speak, severe coughing)      Shortness of breath worsening x 3 days 2. ONSET: "When did this breathing problem begin?"      03/18/19 3. PATTERN "Does the difficult breathing come and go, or has it been constant since it started?"       With activity and at rest 4. SEVERITY: "How bad is your breathing?" (e.g., mild, moderate, severe)    - MILD: No SOB at rest, mild SOB with walking, speaks normally in sentences, can lay down, no retractions, pulse < 100.    - MODERATE: SOB at rest, SOB with minimal exertion and prefers to sit, cannot lie down flat, speaks in phrases, mild retractions, audible wheezing, pulse 100-120.    - SEVERE: Very SOB at rest, speaks in single words, struggling to breathe, sitting hunched forward, retractions, pulse > 120      moderate 5. RECURRENT SYMPTOM: "Have you had difficulty breathing before?" If Yes, ask: "When was the last time?" and "What happened that time?"       6. CARDIAC HISTORY:  "Do you have any history of heart disease?" (e.g., heart attack, angina, bypass surgery, angioplasty)    Venous insufficiency 7. LUNG HISTORY: "Do you have any history of lung disease?"  (e.g., pulmonary embolus, asthma, emphysema)     asthma 8. CAUSE: "What do you think is causing the breathing problem?"      Pt seen by teleheatlh on 01/11/20 and 01/13/20 9. OTHER SYMPTOMS: "Do you have any other symptoms? (e.g., dizziness, runny nose, cough, chest pain, fever)     Productive cough,nogestion 10. PREGNANCY: "Is there any chance you are pregnant?" "When was your last menstrual period?"       no 11. TRAVEL: "Have you traveled out of the country in the last month?" (e.g., travel history, exposures)       no  Protocols used: BREATHING DIFFICULTY-A-AH

## 2020-01-19 NOTE — Telephone Encounter (Signed)
Not our pt

## 2020-01-19 NOTE — Telephone Encounter (Signed)
Routed to [employer]  in error; will route to appropriate office, [facility].

## 2020-01-19 NOTE — ED Provider Notes (Signed)
[facility]    CSN: [id number] Arrival date & time: 01/19/20  1509      History   Chief Complaint Chief Complaint  Patient presents with  . Cough    HPI Ann Morales is a 44 y.o. female.   44 year old female here for evaluation of chest congestion.  Patient was diagnosed with an upper respiratory infection on January 13, 2020 prescribed doxycycline, prednisone, and a Ventolin inhaler.  Patient reports that she took the doxycycline for 3 days but then had to stop because it was causing her to vomit.  Patient has taken the prednisone, which is helped, but did miss 2 doses.  Patient has not been using the inhaler that was prescribed to her because she did not get it from the pharmacy.  Patient reports that she is wheezing, blowing green phlegm out of her nose, coughing up green phlegm, has had chills, wheezing, shortness of breath, and right ear pain.  Patient denies sore throat, or diarrhea.  Patient has not had a fever.     Past Medical History:  Diagnosis Date  . ADHD (attention deficit hyperactivity disorder)   . Anxiety   . Arthritis   . Asthma    well controlled  . Bipolar disorder ([place])   . Depression   . GERD (gastroesophageal reflux disease)   . Headache    migraines  . History of kidney stones    currently as of 05-30-19  . Sleep apnea    cpap    Patient Active Problem List   Diagnosis Date Noted  . History of migraine headaches 10/13/2019  . Menometrorrhagia 05/02/2019  . Mixed incontinence 05/02/2019  . Pelvic pain in female 12/21/2018  . Abdominal pain, right lower quadrant 12/21/2018  . Fusion of spine of cervical region 03/13/2017  . Cervical disc disorder with radiculopathy 02/25/2017  . Lymphedema 04/24/2016  . Varicose veins of bilateral lower extremities with pain 04/08/2016  . Chronic venous insufficiency 04/08/2016  . OSA (obstructive sleep apnea) 04/04/2016  . Adult ADHD 01/18/2016  . Bipolar I disorder, current or most  recent episode depressed, with psychotic features ([place]) 01/18/2016  . Other specified anxiety disorders 01/18/2016  . Allergic rhinitis 01/15/2015  . Current smoker 01/15/2015  . H/O manic depressive disorder 01/15/2015  . H/O renal calculi 01/15/2015  . Asthma 01/15/2015  . HLD (hyperlipidemia) 10/25/2013    Past Surgical History:  Procedure Laterality Date  . BACK SURGERY  03/2017   cervical fusion  . CHOLECYSTECTOMY    . GALLBLADDER SURGERY    . HYSTEROSCOPY WITH NOVASURE N/A 06/02/2019   Procedure: DILATATION & HYSTEROSCOPY WITH NOVASURE ABLATION;  Surgeon: Malachy Mood, MD;  Location: [facility];  Service: Gynecology;  Laterality: N/A;  . LAPAROSCOPY N/A 12/21/2018   Procedure: LAPAROSCOPY DIAGNOSTIC;  Surgeon: Will Bonnet, MD;  Location: [facility];  Service: Gynecology;  Laterality: N/A;  . PLANTAR FASCIA RELEASE Left 06/12/2017   Procedure: ENDOSCOPIC PLANTAR FASCIOTOMY-RELEASE;  Surgeon: Samara Deist, DPM;  Location: [facility];  Service: Podiatry;  Laterality: Left;  . REPAIR EXTENSOR TENDON Left 06/12/2017   Procedure: REPAIR FLEXOR TENDON;  Surgeon: Samara Deist, DPM;  Location: [facility];  Service: Podiatry;  Laterality: Left;  . TARSAL TUNNEL RELEASE Left 06/12/2017   Procedure: TARSAL TUNNEL RELEASE-BAXTER RELEASE;  Surgeon: Samara Deist, DPM;  Location: [facility];  Service: Podiatry;  Laterality: Left;  . TUBAL LIGATION      OB History    Gravida  3   Para  2   Term  2   Preterm      AB  1   Living  2     SAB  1   TAB      Ectopic      Multiple      Live Births  2            Home Medications    Prior to Admission medications   Medication Sig Start Date End Date Taking? Authorizing Provider  albuterol (VENTOLIN HFA) 108 (90 Base) MCG/ACT inhaler Inhale 2 puffs into the lungs every 4 (four) hours as needed for wheezing or shortness of breath. 01/19/20   Margarette Canada, NP  amoxicillin-clavulanate (AUGMENTIN) 875-125 MG tablet Take 1  tablet by mouth every 12 (twelve) hours for 10 days. 01/19/20 01/29/20  Margarette Canada, NP  amphetamine-dextroamphetamine (ADDERALL) 15 MG tablet Take 15 mg by mouth daily at 2 PM.  05/04/17   [provider]  benzonatate (TESSALON PERLES) 100 MG capsule Take 1 capsule (100 mg total) by mouth 3 (three) times daily as needed. 01/11/20   Evelina Dun A, FNP  buPROPion (WELLBUTRIN XL) 150 MG 24 hr tablet Take 150 mg by mouth every morning.  04/16/16   [provider]  chlorhexidine (PERIDEX) 0.12 % solution SMARTSIG:By Mouth 12/14/19   [provider]  clobetasol cream (TEMOVATE) 5.68 % Apply 1 application topically 2 (two) times daily. Up to 5 days per week 10/18/19   Ralene Bathe, MD  clonazePAM (KLONOPIN) 0.5 MG tablet Take 1 tablet (0.5 mg total) by mouth 2 (two) times daily as needed for anxiety. Patient taking differently: Take 0.5 mg by mouth 2 (two) times daily.  04/16/15   Marjie Skiff, MD  DOVONEX 0.005 % cream Apply topically 2 (two) times daily. 10/18/19   Ralene Bathe, MD  fluticasone (FLONASE) 50 MCG/ACT nasal spray Place 2 sprays into both nostrils daily. 01/11/20   Evelina Dun A, FNP  ibuprofen (ADVIL) 800 MG tablet Take 1 tablet (800 mg total) by mouth every 8 (eight) hours as needed. 12/16/19   Trinna Post, PA-C  ipratropium (ATROVENT) 0.03 % nasal spray Place 2 sprays into both nostrils 3 (three) times daily as needed for rhinitis. 04/12/19   Fawze, Mina A, PA-C  LATUDA 40 MG TABS tablet Take 40 mg by mouth daily after supper.  02/06/17   [provider]  lisdexamfetamine (VYVANSE) 40 MG capsule Take 40 mg by mouth in the morning.     [provider]  lithium 300 MG tablet Take 300 mg by mouth 2 (two) times daily. 05/04/17   [provider]  loratadine (CLARITIN) 10 MG tablet One daily as needed for allergies Patient taking differently: Take 10 mg by mouth daily as needed for allergies.  11/20/17   Carmon Ginsberg, PA   omeprazole (PRILOSEC) 20 MG capsule Take 1 capsule (20 mg total) by mouth daily. Patient taking differently: Take 20 mg by mouth every morning.  02/28/19   Trinna Post, PA-C  ondansetron (ZOFRAN ODT) 8 MG disintegrating tablet Take 1 tablet (8 mg total) by mouth every 8 (eight) hours as needed for nausea or vomiting. 01/19/20   Margarette Canada, NP  predniSONE (DELTASONE) 50 MG tablet Take 1 tablet daily with food 01/19/20   Margarette Canada, NP  promethazine-dextromethorphan (PROMETHAZINE-DM) 6.25-15 MG/5ML syrup Take 5 mLs by mouth 4 (four) times daily as needed. 01/19/20   Margarette Canada, NP  Spacer/Aero-Holding Chambers (AEROCHAMBER MV) inhaler Use  as instructed 01/19/20   Margarette Canada, NP  SUMAtriptan (IMITREX) 50 MG tablet TAKE ONE TABLET AT ONSET OF HEADACHE MAY REPEAT IN 2 HOURS IF NOT BETTER 08/19/19   Trinna Post, PA-C  topiramate (TOPAMAX) 50 MG tablet Take 1 tablet (50 mg total) by mouth at bedtime. 12/06/19   Trinna Post, PA-C  traZODone (DESYREL) 100 MG tablet Take 150 mg by mouth at bedtime.    [provider]  TRINTELLIX 10 MG TABS Take 10 mg by mouth every morning.  08/25/16   [provider]  mirabegron ER (MYRBETRIQ) 25 MG TB24 tablet Take 1 tablet (25 mg total) by mouth daily. 04/25/19 01/19/20  Trinna Post, PA-C    Family History Family History  Problem Relation Age of Onset  . Hypercholesterolemia Father   . Bipolar disorder Mother   . Hypercholesterolemia Maternal Grandmother   . Hypertension Maternal Grandmother   . Anxiety disorder Maternal Grandmother   . Depression Maternal Grandmother   . Ovarian cancer Maternal Grandmother   . Colon cancer Maternal Grandfather 37  . Ovarian cancer Paternal Grandmother     Social History Social History   Tobacco Use  . Smoking status: Current Every Day Smoker    Packs/day: 2.00    Years: 15.00    Pack years: 30.00    Types: Cigarettes    Start date: 09/25/1994  . Smokeless tobacco: Never  Used  Vaping Use  . Vaping Use: Never used  Substance Use Topics  . Alcohol use: No    Alcohol/week: 0.0 standard drinks  . Drug use: No     Allergies   Codeine, Azithromycin, and Sulfa antibiotics   Review of Systems Review of Systems  Constitutional: Positive for fatigue. Negative for activity change, appetite change and fever.  HENT: Positive for congestion, ear pain, rhinorrhea, sinus pressure and sinus pain. Negative for sore throat.   Respiratory: Positive for cough, shortness of breath and wheezing.   Cardiovascular: Negative for chest pain.  Gastrointestinal: Positive for nausea and vomiting. Negative for diarrhea.  Musculoskeletal: Negative for arthralgias and myalgias.  Skin: Negative for rash.  Neurological: Negative for syncope and headaches.  Hematological: Negative.   Psychiatric/Behavioral: Negative.      Physical Exam Triage Vital Signs ED Triage Vitals  Enc Vitals Group     BP 01/19/20 1527 108/77     Pulse Rate 01/19/20 1527 75     Resp 01/19/20 1527 18     Temp 01/19/20 1527 98.3 F (36.8 C)     Temp Source 01/19/20 1527 Oral     SpO2 01/19/20 1527 99 %     Weight 01/19/20 1525 240 lb (108.9 kg)     Height 01/19/20 1525 '5\' 7"'  (1.702 m)     Head Circumference --      Peak Flow --      Pain Score 01/19/20 1525 5     Pain Loc --      Pain Edu? --      Excl. in [place]? --    No data found.  Updated Vital Signs BP 108/77 (BP Location: Left Arm)   Pulse 75   Temp 98.3 F (36.8 C) (Oral)   Resp 18   Ht '5\' 7"'  (1.702 m)   Wt 240 lb (108.9 kg)   SpO2 99%   BMI 37.59 kg/m   Visual Acuity Right Eye Distance:   Left Eye Distance:   Bilateral Distance:    Right Eye Near:   Left  Eye Near:    Bilateral Near:     Physical Exam Vitals and nursing note reviewed.  Constitutional:      General: She is not in acute distress.    Appearance: Normal appearance. She is not toxic-appearing.  HENT:     Head: Normocephalic and atraumatic.     Right  Ear: Ear canal and external ear normal.     Left Ear: Tympanic membrane, ear canal and external ear normal.     Ears:     Comments: Right TM is erythematous with a serous effusion with multiple air-fluid levels.  The effusion appears cloudy but no frank pus.    Nose: Congestion and rhinorrhea present.     Comments: Nasal mucosa has marked edema and erythema.  Unable to visualize turbinates.  Frontal sinuses are tender to percussion as are the maxillary sinuses, right greater than left.    Mouth/Throat:     Mouth: Mucous membranes are moist.     Pharynx: Posterior oropharyngeal erythema present. No oropharyngeal exudate.     Comments: Posterior oropharynx has erythema with clear postnasal drip.  Tonsillar pillars are unremarkable, no exudate. Eyes:     General: No scleral icterus.    Extraocular Movements: Extraocular movements intact.     Conjunctiva/sclera: Conjunctivae normal.     Pupils: Pupils are equal, round, and reactive to light.  Neck:     Comments: Patient has mildly tender, mobile, shotty anterior lymphadenopathy. Cardiovascular:     Rate and Rhythm: Normal rate and regular rhythm.     Pulses: Normal pulses.     Heart sounds: Normal heart sounds. No murmur heard.  No gallop.   Pulmonary:     Effort: Pulmonary effort is normal.     Breath sounds: Wheezing present. No rhonchi or rales.     Comments: Patient has inspiratory and expiratory wheezes in all lung fields.  No rales or rhonchi appreciated on exam. Musculoskeletal:        General: No swelling or tenderness. Normal range of motion.     Cervical back: Normal range of motion and neck supple. No tenderness.  Lymphadenopathy:     Cervical: Cervical adenopathy present.  Skin:    General: Skin is warm and dry.     Capillary Refill: Capillary refill takes less than 2 seconds.     Findings: No erythema or rash.  Neurological:     General: No focal deficit present.     Mental Status: She is alert and oriented to person,  place, and time.  Psychiatric:        Mood and Affect: Mood normal.        Behavior: Behavior normal.        Thought Content: Thought content normal.        Judgment: Judgment normal.      UC Treatments / Results  Labs (all labs ordered are listed, but only abnormal results are displayed) Labs Reviewed - No data to display  EKG   Radiology No results found.  Procedures Procedures (including critical care time)  Medications Ordered in UC Medications - No data to display  Initial Impression / Assessment and Plan / UC Course  I have reviewed the triage vital signs and the nursing notes.  Pertinent labs & imaging results that were available during my care of the patient were reviewed by me and considered in my medical decision making (see chart for details).   Patient is here for reevaluation of an upper respiratory infection.  She  was prescribed doxycycline but was unable to continue due to vomiting.  Physical exam reveals a suppurative otitis media on the right.  Sinus tenderness and marked inflammation of the nasal mucosa.  Patient has some clear postnasal drip.  Lung sounds are tight in all lung fields with inspiratory and expiratory wheezes.  Patient's exam is consistent with suppurative otitis media and upper respiratory infection.  Will change antibiotic to Augmentin twice daily for 10 days.  Patient says she tolerates this better.  Will give another 3 days of prednisone, and have patient use her inhaler on a schedule for the next 2 days.   Final Clinical Impressions(s) / UC Diagnoses   Final diagnoses:  Upper respiratory tract infection, unspecified type  Non-recurrent acute suppurative otitis media of right ear without spontaneous rupture of tympanic membrane     Discharge Instructions     Increase your oral fluid intake to help keep your mucus thin.  Take the Augmentin twice daily with food for 10 days.  If you need to use the Zofran for nausea.  Use the  albuterol inhaler, 2 puffs every 4 hours, with the spacer on a schedule for the next 2 days and then as needed.  Take the prednisone daily for the next 5 days.  Perform sinus irrigation with a [employer] sinus rinse kit and distilled water.  Do not use tap water.  Use the Promethazine DM as needed for cough and congestion.  Use this sparingly.  If your symptoms continue follow-up with your primary care provider.    ED Prescriptions    Medication Sig Dispense Auth. Provider   amoxicillin-clavulanate (AUGMENTIN) 875-125 MG tablet Take 1 tablet by mouth every 12 (twelve) hours for 10 days. 20 tablet Margarette Canada, NP   predniSONE (DELTASONE) 50 MG tablet Take 1 tablet daily with food 3 tablet Margarette Canada, NP   albuterol (VENTOLIN HFA) 108 (90 Base) MCG/ACT inhaler Inhale 2 puffs into the lungs every 4 (four) hours as needed for wheezing or shortness of breath. 18 g Margarette Canada, NP   Spacer/Aero-Holding Chambers (AEROCHAMBER MV) inhaler Use as instructed 1 each Margarette Canada, NP   promethazine-dextromethorphan (PROMETHAZINE-DM) 6.25-15 MG/5ML syrup Take 5 mLs by mouth 4 (four) times daily as needed. 118 mL Margarette Canada, NP   ondansetron (ZOFRAN ODT) 8 MG disintegrating tablet Take 1 tablet (8 mg total) by mouth every 8 (eight) hours as needed for nausea or vomiting. 20 tablet Margarette Canada, NP     PDMP not reviewed this encounter.   Margarette Canada, NP 01/19/20 1606

## 2020-01-23 ENCOUNTER — Ambulatory Visit: Payer: Medicaid Other | Admitting: Obstetrics and Gynecology

## 2020-01-26 ENCOUNTER — Telehealth: Payer: Medicaid Other | Admitting: Family

## 2020-01-26 DIAGNOSIS — J019 Acute sinusitis, unspecified: Secondary | ICD-10-CM

## 2020-01-26 MED ORDER — LEVOFLOXACIN 500 MG PO TABS: 500.0000 mg | ORAL_TABLET | Freq: Every day | ORAL | 0 refills | Status: DC

## 2020-01-26 NOTE — Progress Notes (Signed)
We are sorry that you are not feeling well.  Here is how we plan to help!  Based on what you have shared with me it looks like you have sinusitis.  Sinusitis is inflammation and infection in the sinus cavities of the head.  Based on your presentation I believe you most likely have Acute Bacterial Sinusitis.  This is an infection caused by bacteria and is treated with antibiotics. I have prescribed Levofloxicin 500mg  by mouth once daily for 7 days.  Please stop the Augmentin you were given at the [facility] and change to a different antibiotic. Please finish the prednisone you were given at the urgent care. You may use an oral decongestant such as Mucinex D or if you have glaucoma or high blood pressure use plain Mucinex. Saline nasal spray help and can safely be used as often as needed for congestion.  If you develop worsening sinus pain, fever or notice severe headache and vision changes, or if symptoms are not better after completion of antibiotic, please schedule an appointment with a health care provider.    Sinus infections are not as easily transmitted as other respiratory infection, however we still recommend that you avoid close contact with loved ones, especially the very young and elderly.  Remember to wash your hands thoroughly throughout the day as this is the number one way to prevent the spread of infection!  Home Care:  Only take medications as instructed by your medical team.  Complete the entire course of an antibiotic.  Do not take these medications with alcohol.  A steam or ultrasonic humidifier can help congestion.  You can place a towel over your head and breathe in the steam from hot water coming from a faucet.  Avoid close contacts especially the very young and the elderly.  Cover your mouth when you cough or sneeze.  Always remember to wash your hands.  Get Help Right Away If:  You develop worsening fever or sinus pain.  You develop a severe head ache or visual  changes.  Your symptoms persist after you have completed your treatment plan.  Make sure you  Understand these instructions.  Will watch your condition.  Will get help right away if you are not doing well or get worse.  Your e-visit answers were reviewed by a board certified advanced clinical practitioner to complete your personal care plan.  Depending on the condition, your plan could have included both over the counter or prescription medications.  If there is a problem please reply  once you have received a response from your provider.  Your safety is important to [place].  If you have drug allergies check your prescription carefully.    You can use MyChart to ask questions about today's visit, request a non-urgent call back, or ask for a work or school excuse for 24 hours related to this e-Visit. If it has been greater than 24 hours you will need to follow up with your provider, or enter a new e-Visit to address those concerns.  You will get an e-mail in the next two days asking about your experience.  I hope that your e-visit has been valuable and will speed your recovery. Thank you for using e-visits.  Greater than 5 minutes, yet less than 10 minutes of time have been spent researching, coordinating, and implementing care for this patient.

## 2020-02-06 ENCOUNTER — Telehealth: Payer: Medicaid Other | Admitting: Family

## 2020-02-06 DIAGNOSIS — B373 Candidiasis of vulva and vagina: Secondary | ICD-10-CM | POA: Diagnosis not present

## 2020-02-06 DIAGNOSIS — B3731 Acute candidiasis of vulva and vagina: Secondary | ICD-10-CM

## 2020-02-06 MED ORDER — FLUCONAZOLE 150 MG PO TABS: 150.0000 mg | ORAL_TABLET | ORAL | 0 refills | Status: DC | PRN

## 2020-02-06 NOTE — Progress Notes (Signed)

## 2020-02-08 ENCOUNTER — Ambulatory Visit: Payer: Medicaid Other | Admitting: Dermatology

## 2020-03-05 ENCOUNTER — Telehealth: Payer: Medicaid Other | Admitting: Nurse Practitioner

## 2020-03-05 DIAGNOSIS — R0989 Other specified symptoms and signs involving the circulatory and respiratory systems: Secondary | ICD-10-CM

## 2020-03-05 NOTE — Progress Notes (Signed)
Based on what you shared with me it looks like you have unresolved congestion.,that should be evaluated in a face to face office visit. You need covid testing as well as a face to face visit since you have already had 4 rounds of antibiotics. I cannot help you in an evisit for this.    NOTE: If you entered your credit card information for this eVisit, you will not be charged. You may see a "hold" on your card for the $35 but that hold will drop off and you will not have a charge processed.  If you are having a true medical emergency please call 911.     For an urgent face to face visit, [place] has four urgent care centers for your convenience:   . [facility]    [phone]                  Get Driving Directions  [address], [place] [address] . 10 am to 8 pm Monday-Friday . 12 pm to 8 pm Saturday-Sunday   . [facility] at [place]                  Get Driving Directions  [address], [place], [place] [address] . 8 am to 8 pm Monday-Friday . 9 am to 6 pm Saturday . 11 am to 6 pm Sunday   . [facility] at [place]                  Get Driving Directions   [address] [place], [place] [address] . 8 am to 8 pm Monday-Friday . 8 am to 4 pm Saturday-Sunday    . [facility] at [place]                    Get Driving Directions  [phone]  [address] [place], [place] [address]  . Monday-Friday, 12 PM to 6 PM    Your e-visit answers were reviewed by a board certified advanced clinical practitioner to complete your personal care plan.  Thank you for using e-Visits.

## 2020-03-07 NOTE — Progress Notes (Addendum)
MyChart Video Visit    Virtual Visit via Video Note   This visit type was conducted due to national recommendations for restrictions regarding the COVID-19 Pandemic (e.g. social distancing) in an effort to limit this patient's exposure and mitigate transmission in our community. This patient is at least at moderate risk for complications without adequate follow up. This format is felt to be most appropriate for this patient at this time. Physical exam was limited by quality of the video and audio technology used for the visit.    Parties involved in visit as below:    Patient location: at home  Provider location: Provider: Provider's office at  [facility], [place].     I discussed the limitations of evaluation and management by telemedicine and the availability of in person appointments. The patient expressed understanding and agreed to proceed.  Patient: Ann Morales   DOB: [birth year]   44 y.o. Female  MRN: [id number] Visit Date: 03/08/2020  Today's healthcare provider: Marcille Buffy, FNP   Chief Complaint  Patient presents with  . URI   Subjective    HPI HPI    URI    URI symptoms: congestion, cough, rhinorrhea, sinus pain, sore throat, swollen glands and wheezing   Onset: in the past 7 days   Fever: temperature has been with in normal range   Hydration: is drinking plenty of fluids   PMH includes: asthma   Smoker: a smoker       Last edited by Minette Headland, CMA on 03/08/2020  1:32 PM. (History)      01/19/20 augmenting  and was treated for ear infection right ear.   01/26/2020 treated with Levaquin by e- visit.  She was seen pm 03/05/2020 and advised of a face to face office visit.   She reports in the last week she has noticed sinus congestion increasing and yellow nasal discharge.She reports after Levaquin it nasal congestion started to clear up but  Right ear pain persistent. Has pain with tugging on right ear.   Denies any ill exposures, she has no loss of taste or smell.  denies any chest congestion or chest pain.   Patient  denies any fever, body aches,chills, rash, chest pain, shortness of breath, nausea, vomiting, or diarrhea.  Denies dizziness, lightheadedness, pre syncopal or syncopal episodes.    No LMP recorded. Patient has had an ablation.  Patient Active Problem List   Diagnosis Date Noted  . Acute recurrent pansinusitis 03/08/2020  . Ear pain, right 03/08/2020  . History of migraine headaches 10/13/2019  . Menometrorrhagia 05/02/2019  . Mixed incontinence 05/02/2019  . Pelvic pain in female 12/21/2018  . Abdominal pain, right lower quadrant 12/21/2018  . Fusion of spine of cervical region 03/13/2017  . Cervical disc disorder with radiculopathy 02/25/2017  . Lymphedema 04/24/2016  . Varicose veins of bilateral lower extremities with pain 04/08/2016  . Chronic venous insufficiency 04/08/2016  . OSA (obstructive sleep apnea) 04/04/2016  . Adult ADHD 01/18/2016  . Bipolar I disorder, current or most recent episode depressed, with psychotic features ([place]) 01/18/2016  . Other specified anxiety disorders 01/18/2016  . Allergic rhinitis 01/15/2015  . Current smoker 01/15/2015  . H/O manic depressive disorder 01/15/2015  . H/O renal calculi 01/15/2015  . Asthma 01/15/2015  . HLD (hyperlipidemia) 10/25/2013   Past Medical History:  Diagnosis Date  . ADHD (attention deficit hyperactivity disorder)   . Anxiety   . Arthritis   . Asthma  well controlled  . Bipolar disorder (HCC)   . Depression   . GERD (gastroesophageal reflux disease)   . Headache    migraines  . History of kidney stones    currently as of 05-30-19  . Sleep apnea    cpap   Allergies  Allergen Reactions  . Codeine Hives and Shortness Of Breath  . Azithromycin Diarrhea  . Sulfa Antibiotics Other (See Comments)    Severe abdominal pain.      Medications: Outpatient Medications Prior to Visit   Medication Sig  . albuterol (VENTOLIN HFA) 108 (90 Base) MCG/ACT inhaler Inhale 2 puffs into the lungs every 4 (four) hours as needed for wheezing or shortness of breath.  . amphetamine-dextroamphetamine (ADDERALL) 15 MG tablet Take 15 mg by mouth daily at 2 PM.   . benzonatate (TESSALON PERLES) 100 MG capsule Take 1 capsule (100 mg total) by mouth 3 (three) times daily as needed.  [name] Kitchen buPROPion (WELLBUTRIN XL) 150 MG 24 hr tablet Take 150 mg by mouth every morning.   . chlorhexidine (PERIDEX) 0.12 % solution SMARTSIG:By Mouth  . clobetasol cream (TEMOVATE) 0.05 % Apply 1 application topically 2 (two) times daily. Up to 5 days per week  . clonazePAM (KLONOPIN) 0.5 MG tablet Take 1 tablet (0.5 mg total) by mouth 2 (two) times daily as needed for anxiety. (Patient taking differently: Take 0.5 mg by mouth 2 (two) times daily.)  . DOVONEX 0.005 % cream Apply topically 2 (two) times daily.  . fluconazole (DIFLUCAN) 150 MG tablet Take 1 tablet (150 mg total) by mouth every three (3) days as needed.  . fluticasone (FLONASE) 50 MCG/ACT nasal spray Place 2 sprays into both nostrils daily.  [name] Kitchen ibuprofen (ADVIL) 800 MG tablet Take 1 tablet (800 mg total) by mouth every 8 (eight) hours as needed.  Hinda Glatter SUSTENNA 234 MG/1.5ML SUSY injection Inject into the muscle.  . ipratropium (ATROVENT) 0.03 % nasal spray Place 2 sprays into both nostrils 3 (three) times daily as needed for rhinitis.  [name] Kitchen LATUDA 40 MG TABS tablet Take 40 mg by mouth daily after supper.   [name] Kitchen levofloxacin (LEVAQUIN) 500 MG tablet Take 1 tablet (500 mg total) by mouth daily.  [name] Kitchen lidocaine (XYLOCAINE) 2 % solution SMARTSIG:By Mouth  . lisdexamfetamine (VYVANSE) 40 MG capsule Take 40 mg by mouth in the morning.   . lithium 300 MG tablet Take 300 mg by mouth 2 (two) times daily.  [name] Kitchen lithium carbonate 300 MG capsule Take 300 mg by mouth 2 (two) times daily.  [name] Kitchen loratadine (CLARITIN) 10 MG tablet One daily as needed for allergies (Patient taking  differently: Take 10 mg by mouth daily as needed for allergies.)  . omeprazole (PRILOSEC) 20 MG capsule Take 1 capsule (20 mg total) by mouth daily. (Patient taking differently: Take 20 mg by mouth every morning.)  . ondansetron (ZOFRAN ODT) 8 MG disintegrating tablet Take 1 tablet (8 mg total) by mouth every 8 (eight) hours as needed for nausea or vomiting.  . paliperidone (INVEGA) 6 MG 24 hr tablet Take 6 mg by mouth at bedtime.  . promethazine-dextromethorphan (PROMETHAZINE-DM) 6.25-15 MG/5ML syrup Take 5 mLs by mouth 4 (four) times daily as needed. (Patient taking differently: Take 5 mLs by mouth 4 (four) times daily as needed.)  . Spacer/Aero-Holding Chambers (AEROCHAMBER MV) inhaler Use as instructed  . SUMAtriptan (IMITREX) 50 MG tablet TAKE ONE TABLET AT ONSET OF HEADACHE MAY REPEAT IN 2 HOURS IF NOT BETTER  . topiramate (TOPAMAX) 50 MG  tablet Take 1 tablet (50 mg total) by mouth at bedtime.  . traZODone (DESYREL) 100 MG tablet Take 150 mg by mouth at bedtime.  . TRINTELLIX 10 MG TABS Take 10 mg by mouth every morning.   . [DISCONTINUED] predniSONE (DELTASONE) 50 MG tablet Take 1 tablet daily with food   No facility-administered medications prior to visit.    Review of Systems  Constitutional: Positive for fatigue.  HENT: Positive for congestion, ear pain (right ear ), postnasal drip, rhinorrhea, sinus pressure, sinus pain and sore throat. Negative for ear discharge, facial swelling, hearing loss, mouth sores, nosebleeds and trouble swallowing.   Respiratory: Positive for cough. Negative for apnea, choking, chest tightness, shortness of breath, wheezing and stridor.       Objective    There were no vitals taken for this visit.  No vital signs available.  Physical Exam    Patient is alert and oriented and responsive to questions Engages in conversation with provider. Speaks in full sentences without any pauses without any shortness of breath or distress.    Assessment & Plan      Acute recurrent pansinusitis  Ear pain, right  Patient does not have chest congestion and has had history of recurrent sinusitis, will treat for sinusitis and otitis media and possible externa. She is advised to have covid testing she declines in parking lost testing today for RSV - Covid and Flu.   She is aware if any symptoms worsen at anytime I advies an in person evaluation at urgent care immediately .   After covid test she is doing at [facility] or drug store returns recommends ENT referral for recurrent sinusitis. She will call back for this.   Red Flags discussed. The patient was given clear instructions to go to ER or return to medical center if any red flags develop, symptoms do not improve, worsen or new problems develop. They verbalized understanding.  Return in about 1 week (around 03/15/2020), or if symptoms worsen or fail to improve, for at any time for any worsening symptoms, Go to Emergency room/ urgent care if worse.     I discussed the assessment and treatment plan with the patient. The patient was provided an opportunity to ask questions and all were answered. The patient agreed with the plan and demonstrated an understanding of the instructions.   The patient was advised to call back or seek an in-person evaluation if the symptoms worsen or if the condition fails to improve as anticipated.  The entirety of the information documented in the History of Present Illness, Review of Systems and Physical Exam were personally obtained by me. Portions of this information were initially documented by the CMA and reviewed by me for thoroughness and accuracy.     Jairo Ben, FNP [facility] [phone] (phone) [phone] (fax)  [facility]

## 2020-03-08 ENCOUNTER — Encounter: Payer: Self-pay | Admitting: Adult Health

## 2020-03-08 ENCOUNTER — Telehealth (INDEPENDENT_AMBULATORY_CARE_PROVIDER_SITE_OTHER): Payer: Medicaid Other | Admitting: Adult Health

## 2020-03-08 DIAGNOSIS — J0141 Acute recurrent pansinusitis: Secondary | ICD-10-CM | POA: Diagnosis not present

## 2020-03-08 DIAGNOSIS — H9201 Otalgia, right ear: Secondary | ICD-10-CM | POA: Insufficient documentation

## 2020-03-08 MED ORDER — PREDNISONE 10 MG (21) PO TBPK: ORAL_TABLET | ORAL | 0 refills | Status: DC

## 2020-03-08 MED ORDER — NEOMYCIN-POLYMYXIN-HC 3.5-10000-1 OT SOLN: 3.0000 [drp] | Freq: Four times a day (QID) | OTIC | 0 refills | Status: DC

## 2020-03-08 MED ORDER — AMOXICILLIN-POT CLAVULANATE 875-125 MG PO TABS: 1.0000 | ORAL_TABLET | Freq: Two times a day (BID) | ORAL | 0 refills | Status: DC

## 2020-03-08 MED ORDER — SALINE SPRAY 0.65 % NA SOLN: 2.0000 | NASAL | 0 refills | Status: DC | PRN

## 2020-03-08 NOTE — Patient Instructions (Addendum)
Red Flags discussed. The patient was given clear instructions to go to ER or return to medical center if any red flags develop, symptoms do not improve, worsen or new problems develop. They verbalized understanding. I discussed the limitations of evaluation and management by telemedicine and the availability of in person appointments. The patient expressed understanding and agreed to proceed.     Hydrocortisone; Neomycin; Polymyxin B ear solution What is this medicine? HYDROCORTISONE; NEOMYCIN; and POLYMYXIN B (hye droe KOR ti sone; nee oh MYE sin; pol i MIX in B) is used to treat ear infections. This medicine may be used for other purposes; ask your health care provider or pharmacist if you have questions. COMMON BRAND NAME(S): AK-Spore HC, AK-Spore HC Otic, Antibiotic Otic, Cortisporin, Cortomycin, Oti-Sone, Oticin HC, Otimar, Otocidin What should I tell my health care provider before I take this medicine? They need to know if you have any of these conditions:  any other active infections  chronic ear infections or fluid in the ear  perforated ear drum  an unusual or allergic reaction to hydrocortisone, neomycin, polymyxin B, sulfites, other medicines, foods, dyes, or preservatives  pregnant or trying to get pregnant  breast-feeding How should I use this medicine? This medicine is only for use in the ears. Follow the directions on the prescription label. Wash hands before and after use. Clean your ear of any fluid that can be easily removed. Do not insert any object or swab into the ear canal. Gently warm the bottle by holding it in the hand for 1 to 2 minutes. Lie down on your side with the infected ear facing upward. Try not to touch the tip of the dropper to your ear, fingertips, or other surface. Squeeze the bottle gently to put the prescribed number of drops in the ear canal. Stay in this position for 30 to 60 seconds to help the drops soak into the ear. Repeat the steps for the  other ear if both ears are infected. Do not use your medicine more often than directed. Finish the full course of medicine prescribed by your doctor or health care professional even if you think your condition is better. Talk to your pediatrician regarding the use of this medicine in children. While this drug may be prescribed for selected conditions, precautions do apply. Overdosage: If you think you have taken too much of this medicine contact a poison control center or emergency room at once. NOTE: This medicine is only for you. Do not share this medicine with others. What if I miss a dose? If you miss a dose, use it as soon as you can. If it is almost time for your next dose, use only that dose. Do not take double or extra doses. What may interact with this medicine? Interactions are not expected. Do not use other ear products without talking to your doctor or health care professional. This list may not describe all possible interactions. Give your health care provider a list of all the medicines, herbs, non-prescription drugs, or dietary supplements you use. Also tell them if you smoke, drink alcohol, or use illegal drugs. Some items may interact with your medicine. What should I watch for while using this medicine? Tell your doctor or health care professional if your ear infection does not get better in a few days. Do not use longer than 10 days unless instructed by your doctor or health care professional. If rash or allergic reaction occurs, stop the product immediately and contact your physician. It  is important that you keep the infected ear(s) clean and dry. When bathing, try not to get the infected ear(s) wet. Do not go swimming unless your doctor or health care professional has told you otherwise. To prevent the spread of infection, do not share ear products, or share towels and washcloths with anyone else. What side effects may I notice from receiving this medicine? Side effects that you  should report to your doctor or health care professional as soon as possible:  rash  red, itchy, dry scaly skin at the affected site  worsening ear pain Side effects that usually do not require medical attention (report to your doctor or health care professional if they continue or are bothersome):  abnormal sensation in the ear  burning or stinging while putting the drops in the ear This list may not describe all possible side effects. Call your doctor for medical advice about side effects. You may report side effects to FDA at 1-800-FDA-1088. Where should I keep my medicine? Keep out of the reach of children. Store at room temperature between 15 and 25 degrees C (59 and 77 degrees F). Do not freeze. Throw away any unused medicine after the expiration date. NOTE: This sheet is a summary. It may not cover all possible information. If you have questions about this medicine, talk to your doctor, pharmacist, or health care provider.  2020 Elsevier/Gold Standard (2014-08-15 15:13:48) Prednisolone tablets What is this medicine? PREDNISOLONE (pred NISS oh lone) is a corticosteroid. It is commonly used to treat inflammation of the skin, joints, lungs, and other organs. Common conditions treated include asthma, allergies, and arthritis. It is also used for other conditions, such as blood disorders and diseases of the adrenal glands. This medicine may be used for other purposes; ask your health care provider or pharmacist if you have questions. COMMON BRAND NAME(S): Millipred, Millipred DP, Millipred DP 12-Day, Millipred DP 6 Day, Prednoral What should I tell my health care provider before I take this medicine? They need to know if you have any of these conditions:  Cushing's syndrome  diabetes  glaucoma  heart problems or disease  high blood pressure  infection such as herpes, measles, tuberculosis, or chickenpox  kidney disease  liver disease  mental problems  myasthenia  gravis  osteoporosis  seizures  stomach ulcer or intestine disease including colitis and diverticulitis  thyroid problem  an unusual or allergic reaction to lactose, prednisolone, other medicines, foods, dyes, or preservatives  pregnant or trying to get pregnant  breast-feeding How should I use this medicine? Take this medicine by mouth with a glass of water. Follow the directions on the prescription label. Take it with food or milk to avoid stomach upset. If you are taking this medicine once a day, take it in the morning. Do not take more medicine than you are told to take. Do not suddenly stop taking your medicine because you may develop a severe reaction. Your doctor will tell you how much medicine to take. If your doctor wants you to stop the medicine, the dose may be slowly lowered over time to avoid any side effects. Talk to your pediatrician regarding the use of this medicine in children. Special care may be needed. Overdosage: If you think you have taken too much of this medicine contact a poison control center or emergency room at once. NOTE: This medicine is only for you. Do not share this medicine with others. What if I miss a dose? If you miss a dose, take  it as soon as you can. If it is almost time for your next dose, take only that dose. Do not take double or extra doses. What may interact with this medicine? Do not take this medicine with any of the following medications:  metyrapone  mifepristone This medicine may also interact with the following medications:  aminoglutethimide  amphotericin B  aspirin and aspirin-like medicines  barbiturates  certain medicines for diabetes, like glipizide or glyburide  cholestyramine  cholinesterase inhibitors  cyclosporine  digoxin  diuretics  ephedrine  female hormones, like estrogens and birth control pills  isoniazid  ketoconazole  NSAIDS, medicines for pain and inflammation, like ibuprofen or  naproxen  phenytoin  rifampin  toxoids  vaccines  warfarin This list may not describe all possible interactions. Give your health care provider a list of all the medicines, herbs, non-prescription drugs, or dietary supplements you use. Also tell them if you smoke, drink alcohol, or use illegal drugs. Some items may interact with your medicine. What should I watch for while using this medicine? Visit your doctor or health care professional for regular checks on your progress. If you are taking this medicine over a prolonged period, carry an identification card with your name and address, the type and dose of your medicine, and your doctor's name and address. This medicine may increase your risk of getting an infection. Tell your doctor or health care professional if you are around anyone with measles or chickenpox, or if you develop sores or blisters that do not heal properly. If you are going to have surgery, tell your doctor or health care professional that you have taken this medicine within the last twelve months. Ask your doctor or health care professional about your diet. You may need to lower the amount of salt you eat. This medicine may increase blood sugar. Ask your healthcare provider if changes in diet or medicines are needed if you have diabetes. What side effects may I notice from receiving this medicine? Side effects that you should report to your doctor or health care professional as soon as possible:  allergic reactions like skin rash, itching or hives, swelling of the face, lips, or tongue  changes in emotions or moods  eye pain   signs and symptoms of high blood sugar such as being more thirsty or hungry or having to urinate more than normal. You may also feel very tired or have blurry vision.  signs and symptoms of infection like fever or chills; cough; sore throat; pain or trouble passing urine  slow growth in children (if used for longer periods of  time)  swelling of ankles, feet  trouble sleeping  weak bones (if used for longer periods of time) Side effects that usually do not require medical attention (report to your doctor or health care professional if they continue or are bothersome):  nausea  skin problems, acne, thin and shiny skin  upset stomach  weight gain This list may not describe all possible side effects. Call your doctor for medical advice about side effects. You may report side effects to FDA at 1-800-FDA-1088. Where should I keep my medicine? Keep out of the reach of children. Store at room temperature between 15 and 30 degrees C (59 and 86 degrees F). Keep container tightly closed. Throw away any unused medicine after the expiration date. NOTE: This sheet is a summary. It may not cover all possible information. If you have questions about this medicine, talk to your doctor, pharmacist, or health care  provider.  2020 Elsevier/Gold Standard (2017-11-26 10:30:56) Amoxicillin; Clavulanic Acid Tablets What is this medicine? AMOXICILLIN; CLAVULANIC ACID (a mox i SIL in; KLAV yoo lan ic AS id) is a penicillin antibiotic. It treats some infections caused by bacteria. It will not work for colds, the flu, or other viruses. This medicine may be used for other purposes; ask your health care provider or pharmacist if you have questions. COMMON BRAND NAME(S): Augmentin What should I tell my health care provider before I take this medicine? They need to know if you have any of these conditions:  bowel disease, like colitis  kidney disease  liver disease  mononucleosis  an unusual or allergic reaction to amoxicillin, penicillin, cephalosporin, other antibiotics, clavulanic acid, other medicines, foods, dyes, or preservatives  pregnant or trying to get pregnant  breast-feeding How should I use this medicine? Take this drug by mouth. Take it as directed on the prescription label at the same time every day. Take it  with food at the start of a meal or snack. Take all of this drug unless your health care provider tells you to stop it early. Keep taking it even if you think you are better. Talk to your health care provider about the use of this drug in children. While it may be prescribed for selected conditions, precautions do apply. Overdosage: If you think you have taken too much of this medicine contact a poison control center or emergency room at once. NOTE: This medicine is only for you. Do not share this medicine with others. What if I miss a dose? If you miss a dose, take it as soon as you can. If it is almost time for your next dose, take only that dose. Do not take double or extra doses. What may interact with this medicine?  allopurinol  anticoagulants  birth control pills  methotrexate  probenecid This list may not describe all possible interactions. Give your health care provider a list of all the medicines, herbs, non-prescription drugs, or dietary supplements you use. Also tell them if you smoke, drink alcohol, or use illegal drugs. Some items may interact with your medicine. What should I watch for while using this medicine? Tell your doctor or healthcare provider if your symptoms do not improve. This medicine may cause serious skin reactions. They can happen weeks to months after starting the medicine. Contact your healthcare provider right away if you notice fevers or flu-like symptoms with a rash. The rash may be red or purple and then turn into blisters or peeling of the skin. Or, you might notice a red rash with swelling of the face, lips or lymph nodes in your neck or under your arms. Do not treat diarrhea with over the counter products. Contact your doctor if you have diarrhea that lasts more than 2 days or if it is severe and watery. If you have diabetes, you may get a false-positive result for sugar in your urine. Check with your doctor or healthcare provider. Birth control pills  may not work properly while you are taking this medicine. Talk to your doctor about using an extra method of birth control. What side effects may I notice from receiving this medicine? Side effects that you should report to your doctor or health care professional as soon as possible:  allergic reactions like skin rash, itching or hives, swelling of the face, lips, or tongue  breathing problems  dark urine  fever or chills, sore throat  redness, blistering, peeling, or loosening of  the skin, including inside the mouth  seizures  trouble passing urine or change in the amount of urine  unusual bleeding, bruising  unusually weak or tired  white patches or sores in the mouth or throat Side effects that usually do not require medical attention (report to your doctor or health care professional if they continue or are bothersome):  diarrhea  dizziness  headache  nausea, vomiting  stomach upset  vaginal or anal irritation This list may not describe all possible side effects. Call your doctor for medical advice about side effects. You may report side effects to FDA at 1-800-FDA-1088. Where should I keep my medicine? Keep out of the reach of children and pets. Store at room temperature between 20 and 25 degrees C (68 and 77 degrees F). Throw away any unused drug after the expiration date. NOTE: This sheet is a summary. It may not cover all possible information. If you have questions about this medicine, talk to your doctor, pharmacist, or health care provider.  2020 Elsevier/Gold Standard (2018-09-27 11:55:53) Sinusitis, Adult Sinusitis is soreness and swelling (inflammation) of your sinuses. Sinuses are hollow spaces in the bones around your face. They are located:  Around your eyes.  In the middle of your forehead.  Behind your nose.  In your cheekbones. Your sinuses and nasal passages are lined with a fluid called mucus. Mucus drains out of your sinuses. Swelling can  trap mucus in your sinuses. This lets germs (bacteria, virus, or fungus) grow, which leads to infection. Most of the time, this condition is caused by a virus. What are the causes? This condition is caused by:  Allergies.  Asthma.  Germs.  Things that block your nose or sinuses.  Growths in the nose (nasal polyps).  Chemicals or irritants in the air.  Fungus (rare). What increases the risk? You are more likely to develop this condition if:  You have a weak body defense system (immune system).  You do a lot of swimming or diving.  You use nasal sprays too much.  You smoke. What are the signs or symptoms? The main symptoms of this condition are pain and a feeling of pressure around the sinuses. Other symptoms include:  Stuffy nose (congestion).  Runny nose (drainage).  Swelling and warmth in the sinuses.  Headache.  Toothache.  A cough that may get worse at night.  Mucus that collects in the throat or the back of the nose (postnasal drip).  Being unable to smell and taste.  Being very tired (fatigue).  A fever.  Sore throat.  Bad breath. How is this diagnosed? This condition is diagnosed based on:  Your symptoms.  Your medical history.  A physical exam.  Tests to find out if your condition is short-term (acute) or long-term (chronic). Your doctor may: ? Check your nose for growths (polyps). ? Check your sinuses using a tool that has a light (endoscope). ? Check for allergies or germs. ? Do imaging tests, such as an MRI or CT scan. How is this treated? Treatment for this condition depends on the cause and whether it is short-term or long-term.  If caused by a virus, your symptoms should go away on their own within 10 days. You may be given medicines to relieve symptoms. They include: ? Medicines that shrink swollen tissue in the nose. ? Medicines that treat allergies (antihistamines). ? A spray that treats swelling of the nostrils. ? Rinses that  help get rid of thick mucus in your nose (nasal  saline washes).  If caused by bacteria, your doctor may wait to see if you will get better without treatment. You may be given antibiotic medicine if you have: ? A very bad infection. ? A weak body defense system.  If caused by growths in the nose, you may need to have surgery. Follow these instructions at home: Medicines  Take, use, or apply over-the-counter and prescription medicines only as told by your doctor. These may include nasal sprays.  If you were prescribed an antibiotic medicine, take it as told by your doctor. Do not stop taking the antibiotic even if you start to feel better. Hydrate and humidify   Drink enough water to keep your pee (urine) pale yellow.  Use a cool mist humidifier to keep the humidity level in your home above 50%.  Breathe in steam for 10-15 minutes, 3-4 times a day, or as told by your doctor. You can do this in the bathroom while a hot shower is running.  Try not to spend time in cool or dry air. Rest  Rest as much as you can.  Sleep with your head raised (elevated).  Make sure you get enough sleep each night. General instructions   Put a warm, moist washcloth on your face 3-4 times a day, or as often as told by your doctor. This will help with discomfort.  Wash your hands often with soap and water. If there is no soap and water, use hand sanitizer.  Do not smoke. Avoid being around people who are smoking (secondhand smoke).  Keep all follow-up visits as told by your doctor. This is important. Contact a doctor if:  You have a fever.  Your symptoms get worse.  Your symptoms do not get better within 10 days. Get help right away if:  You have a very bad headache.  You cannot stop throwing up (vomiting).  You have very bad pain or swelling around your face or eyes.  You have trouble seeing.  You feel confused.  Your neck is stiff.  You have trouble  breathing. Summary  Sinusitis is swelling of your sinuses. Sinuses are hollow spaces in the bones around your face.  This condition is caused by tissues in your nose that become inflamed or swollen. This traps germs. These can lead to infection.  If you were prescribed an antibiotic medicine, take it as told by your doctor. Do not stop taking it even if you start to feel better.  Keep all follow-up visits as told by your doctor. This is important. This information is not intended to replace advice given to you by your health care provider. Make sure you discuss any questions you have with your health care provider. Document Revised: 07/27/2017 Document Reviewed: 07/27/2017 Elsevier Patient Education  [place], Adult An earache, or ear pain, can be caused by many things, including:  An infection.  Ear wax buildup.  Ear pressure.  Something in the ear that should not be there (foreign body).  A sore throat.  Tooth problems.  Jaw problems. Treatment of the earache will depend on the cause. If the cause is not clear or cannot be determined, you may need to watch your symptoms until your earache goes away or until a cause is found. Follow these instructions at home: Medicines  Take or apply over-the-counter and prescription medicines only as told by your health care provider.  If you were prescribed an antibiotic medicine, use it as told by your health care provider. Do  not stop using the antibiotic even if you start to feel better.  Do not put anything in your ear other than medicine that is prescribed by your health care provider. Managing pain If directed, apply heat to the affected area as often as told by your health care provider. Use the heat source that your health care provider recommends, such as a moist heat pack or a heating pad.  Place a towel between your skin and the heat source.  Leave the heat on for 20-30 minutes.  Remove the heat if your  skin turns bright red. This is especially important if you are unable to feel pain, heat, or cold. You may have a greater risk of getting burned. If directed, put ice on the affected area as often as told by your health care provider. To do this:      Put ice in a plastic bag.  Place a towel between your skin and the bag.  Leave the ice on for 20 minutes, 2-3 times a day. General instructions  Pay attention to any changes in your symptoms.  Try resting in an upright position instead of lying down. This may help to reduce pressure in your ear and relieve pain.  Chew gum if it helps to relieve your ear pain.  Treat any allergies as told by your health care provider.  Drink enough fluid to keep your urine pale yellow.  It is up to you to get the results of any tests that were done. Ask your health care provider, or the department that is doing the tests, when your results will be ready.  Keep all follow-up visits as told by your health care provider. This is important. Contact a health care provider if:  Your pain does not improve within 2 days.  Your earache gets worse.  You have new symptoms.  You have a fever. Get help right away if you:  Have a severe headache.  Have a stiff neck.  Have trouble swallowing.  Have redness or swelling behind your ear.  Have fluid or blood coming from your ear.  Have hearing loss.  Feel dizzy. Summary  An earache, or ear pain, can be caused by many things.  Treatment of the earache will depend on the cause. Follow recommendations from your health care provider to treat your ear pain.  If the cause is not clear or cannot be determined, you may need to watch your symptoms until your earache goes away or until a cause is found.  Keep all follow-up visits as told by your health care provider. This is important. This information is not intended to replace advice given to you by your health care provider. Make sure you discuss any  questions you have with your health care provider. Document Revised: 10/02/2018 Document Reviewed: 10/02/2018 Elsevier Patient Education  2020 [employer].

## 2020-03-19 ENCOUNTER — Encounter: Payer: Self-pay | Admitting: Physician Assistant

## 2020-03-19 DIAGNOSIS — B370 Candidal stomatitis: Secondary | ICD-10-CM

## 2020-03-19 MED ORDER — NYSTATIN 100000 UNIT/ML MT SUSP: 5.0000 mL | Freq: Four times a day (QID) | OROMUCOSAL | 0 refills | Status: DC

## 2020-03-26 ENCOUNTER — Other Ambulatory Visit: Payer: Self-pay | Admitting: Physician Assistant

## 2020-03-26 DIAGNOSIS — K219 Gastro-esophageal reflux disease without esophagitis: Secondary | ICD-10-CM

## 2020-03-26 NOTE — Telephone Encounter (Signed)
Requested medication (s) are due for refill today: yes  Requested medication (s) are on the active medication list:yes  Last refill:  02/28/19  #90  1 refill  Future visit scheduled: yes  Notes to clinic: Rx has expired.    Requested Prescriptions  Pending Prescriptions Disp Refills   omeprazole (PRILOSEC) 20 MG capsule [Pharmacy Med Name: OMEPRAZOLE DR 20 MG CAPSULE] 30 capsule 5    Sig: TAKE 1 CAPSULE BY MOUTH EVERY DAY      Gastroenterology: Proton Pump Inhibitors Passed - 03/26/2020  3:50 PM      Passed - Valid encounter within last 12 months    Recent Outpatient Visits           2 weeks ago Acute recurrent pansinusitis   [facility], Kelby Aline, FNP   3 months ago Other migraine without status migrainosus, not intractable   [place], [place], PA-C   5 months ago History of migraine headaches   [place], [place], [place]   7 months ago History of migraine headaches   [place], [place], [place]   11 months ago Menorrhagia with regular cycle   [place], Wendee [place], [place]       Future Appointments             In 2 days Trinna Post, PA-C [employer], [place]   In 2 days Ralene Bathe, MD [place]   In 3 days Trinna Post, [place], [place]   In 1 month Ralene Bathe, MD [place]

## 2020-03-28 ENCOUNTER — Ambulatory Visit: Payer: Medicaid Other | Admitting: Dermatology

## 2020-03-28 ENCOUNTER — Telehealth: Payer: Medicaid Other | Admitting: Physician Assistant

## 2020-03-29 ENCOUNTER — Telehealth (INDEPENDENT_AMBULATORY_CARE_PROVIDER_SITE_OTHER): Payer: Medicaid Other | Admitting: Physician Assistant

## 2020-03-29 DIAGNOSIS — U071 COVID-19: Secondary | ICD-10-CM

## 2020-03-29 DIAGNOSIS — J0111 Acute recurrent frontal sinusitis: Secondary | ICD-10-CM

## 2020-03-29 HISTORY — DX: COVID-19: U07.1

## 2020-03-29 MED ORDER — MONTELUKAST SODIUM 10 MG PO TABS: 10.0000 mg | ORAL_TABLET | Freq: Every day | ORAL | 1 refills | Status: DC

## 2020-03-29 NOTE — Progress Notes (Signed)
MyChart Video Visit    Virtual Visit via Video Note   This visit type was conducted due to national recommendations for restrictions regarding the COVID-19 Pandemic (e.g. social distancing) in an effort to limit this patient's exposure and mitigate transmission in our community. This patient is at least at moderate risk for complications without adequate follow up. This format is felt to be most appropriate for this patient at this time. Physical exam was limited by quality of the video and audio technology used for the visit.   Patient location: Home Provider location: Office   I discussed the limitations of evaluation and management by telemedicine and the availability of in person appointments. The patient expressed understanding and agreed to proceed.  Patient: Ann Morales   DOB: 26-Feb-1976   45 y.o. Female  MRN: [id number] Visit Date: 03/29/2020  Today's healthcare provider: Trinna Post, PA-C   Chief Complaint  Patient presents with  . Cough   Subjective    Cough The current episode started more than 1 month ago. The problem has been unchanged. The cough is productive of sputum. Associated symptoms include headaches, a sore throat, shortness of breath and wheezing. Pertinent negatives include no fever.    Patient would like to be referred to ENT due to cough not getting any better. Patient reports upper respiratory symptoms fore three months. She reports her symptoms started with body aches, chills, and productive cough. She received doxycyline 100 mg bID x 7 days and prednisone for URI. She was seen in the ER 5 days later and was given Augmentin and prednisone. ON 01/26/2020 she was seen through an e-visit and received levaquin 500 mg QD x 7 day.     Medications: Outpatient Medications Prior to Visit  Medication Sig  . albuterol (VENTOLIN HFA) 108 (90 Base) MCG/ACT inhaler Inhale 2 puffs into the lungs every 4 (four) hours as needed for wheezing or shortness of  breath.  . amphetamine-dextroamphetamine (ADDERALL) 15 MG tablet Take 15 mg by mouth daily at 2 PM.   . buPROPion (WELLBUTRIN XL) 150 MG 24 hr tablet Take 150 mg by mouth every morning.   . chlorhexidine (PERIDEX) 0.12 % solution SMARTSIG:By Mouth  . clobetasol cream (TEMOVATE) 1.49 % Apply 1 application topically 2 (two) times daily. Up to 5 days per week  . clonazePAM (KLONOPIN) 0.5 MG tablet Take 1 tablet (0.5 mg total) by mouth 2 (two) times daily as needed for anxiety. (Patient taking differently: Take 0.5 mg by mouth 2 (two) times daily.)  . DOVONEX 0.005 % cream Apply topically 2 (two) times daily.  . fluticasone (FLONASE) 50 MCG/ACT nasal spray Place 2 sprays into both nostrils daily.  [name] Kitchen ibuprofen (ADVIL) 800 MG tablet Take 1 tablet (800 mg total) by mouth every 8 (eight) hours as needed.  [name] SUSTENNA 234 MG/1.5ML SUSY injection Inject into the muscle.  . ipratropium (ATROVENT) 0.03 % nasal spray Place 2 sprays into both nostrils 3 (three) times daily as needed for rhinitis.  [name] Kitchen LATUDA 40 MG TABS tablet Take 40 mg by mouth daily after supper.   . lidocaine (XYLOCAINE) 2 % solution SMARTSIG:By Mouth  . lisdexamfetamine (VYVANSE) 40 MG capsule Take 40 mg by mouth in the morning.   . lithium 300 MG tablet Take 300 mg by mouth 2 (two) times daily.  [name] Kitchen lithium carbonate 300 MG capsule Take 300 mg by mouth 2 (two) times daily.  [name] Kitchen loratadine (CLARITIN) 10 MG tablet One daily as needed for  allergies (Patient taking differently: Take 10 mg by mouth daily as needed for allergies.)  . nystatin (MYCOSTATIN) 100000 UNIT/ML suspension Take 5 mLs (500,000 Units total) by mouth 4 (four) times daily.  [name] Kitchen omeprazole (PRILOSEC) 20 MG capsule TAKE 1 CAPSULE BY MOUTH EVERY DAY  . ondansetron (ZOFRAN ODT) 8 MG disintegrating tablet Take 1 tablet (8 mg total) by mouth every 8 (eight) hours as needed for nausea or vomiting.  . paliperidone (INVEGA) 6 MG 24 hr tablet Take 6 mg by mouth at bedtime.  . sodium  chloride (OCEAN) 0.65 % SOLN nasal spray Place 2 sprays into both nostrils as needed for congestion.  [name] Kitchen Spacer/Aero-Holding Chambers (AEROCHAMBER MV) inhaler Use as instructed  . SUMAtriptan (IMITREX) 50 MG tablet TAKE ONE TABLET AT ONSET OF HEADACHE MAY REPEAT IN 2 HOURS IF NOT BETTER  . topiramate (TOPAMAX) 50 MG tablet Take 1 tablet (50 mg total) by mouth at bedtime.  . traZODone (DESYREL) 100 MG tablet Take 150 mg by mouth at bedtime.  . TRINTELLIX 10 MG TABS Take 10 mg by mouth every morning.   [name] Kitchen amoxicillin-clavulanate (AUGMENTIN) 875-125 MG tablet Take 1 tablet by mouth 2 (two) times daily. (Patient not taking: Reported on 03/29/2020)  . benzonatate (TESSALON PERLES) 100 MG capsule Take 1 capsule (100 mg total) by mouth 3 (three) times daily as needed. (Patient not taking: Reported on 03/29/2020)  . fluconazole (DIFLUCAN) 150 MG tablet Take 1 tablet (150 mg total) by mouth every three (3) days as needed. (Patient not taking: Reported on 03/29/2020)  . levofloxacin (LEVAQUIN) 500 MG tablet Take 1 tablet (500 mg total) by mouth daily. (Patient not taking: Reported on 03/29/2020)  . neomycin-polymyxin-hydrocortisone (CORTISPORIN) OTIC solution Place 3 drops into the right ear 4 (four) times daily. (Patient not taking: Reported on 03/29/2020)  . predniSONE (STERAPRED UNI-PAK 21 TAB) 10 MG (21) TBPK tablet PO: Take 6 tablets on day 1:Take 5 tablets day 2:Take 4 tablets day 3: Take 3 tablets day 4:Take 2 tablets day five: 5 Take 1 tablet day 6 (Patient not taking: Reported on 03/29/2020)  . promethazine-dextromethorphan (PROMETHAZINE-DM) 6.25-15 MG/5ML syrup Take 5 mLs by mouth 4 (four) times daily as needed. (Patient not taking: Reported on 03/29/2020)   No facility-administered medications prior to visit.    Review of Systems  Constitutional: Negative for fever.  HENT: Positive for sore throat.   Respiratory: Positive for cough, shortness of breath and wheezing.   Neurological: Positive for  headaches.      Objective    There were no vitals taken for this visit.   Physical Exam Constitutional:      Appearance: Normal appearance.  Pulmonary:     Effort: Pulmonary effort is normal. No respiratory distress.  Neurological:     Mental Status: She is alert.  Psychiatric:        Mood and Affect: Mood normal.        Behavior: Behavior normal.        Assessment & Plan    1. Acute recurrent frontal sinusitis  Has received multiple rounds of antibiotics including doxycycline, augmentin and levaquin. DO not think she would benefit from additional antibiotics at this point. Will have her take daily 2nd generation antihistamine, start on flonase and also singulair. Refer to ent with covid swab as below today at office at 3:00 PM.   - COVID-19, Flu A+B and RSV - montelukast (SINGULAIR) 10 MG tablet; Take 1 tablet (10 mg total) by mouth at bedtime.  Dispense:  90 tablet; Refill: 1 - Ambulatory referral to ENT   No follow-ups on file.     I discussed the assessment and treatment plan with the patient. The patient was provided an opportunity to ask questions and all were answered. The patient agreed with the plan and demonstrated an understanding of the instructions.   The patient was advised to call back or seek an in-person evaluation if the symptoms worsen or if the condition fails to improve as anticipated.   ITrinna Post, PA-C, have reviewed all documentation for this visit. The documentation on 03/29/20 for the exam, diagnosis, procedures, and orders are all accurate and complete.  The entirety of the information documented in the History of Present Illness, Review of Systems and Physical Exam were personally obtained by me. Portions of this information were initially documented by Wilburt Finlay, CMA and reviewed by me for thoroughness and accuracy.    Paulene Floor [facility] [phone] (phone) [phone] (fax)  [place]

## 2020-03-31 LAB — SPECIMEN STATUS REPORT

## 2020-03-31 LAB — COVID-19, FLU A+B AND RSV
Influenza A, NAA: NOT DETECTED
Influenza B, NAA: NOT DETECTED
RSV, NAA: NOT DETECTED
SARS-CoV-2, NAA: DETECTED — AB

## 2020-04-02 ENCOUNTER — Other Ambulatory Visit: Payer: Self-pay

## 2020-04-02 ENCOUNTER — Ambulatory Visit (INDEPENDENT_AMBULATORY_CARE_PROVIDER_SITE_OTHER): Payer: Medicaid Other

## 2020-04-02 ENCOUNTER — Ambulatory Visit (INDEPENDENT_AMBULATORY_CARE_PROVIDER_SITE_OTHER): Payer: Medicaid Other | Admitting: Nurse Practitioner

## 2020-04-02 VITALS — BP 88/54 | HR 125 | Ht 67.0 in | Wt 245.0 lb

## 2020-04-02 DIAGNOSIS — I83812 Varicose veins of left lower extremities with pain: Secondary | ICD-10-CM

## 2020-04-02 DIAGNOSIS — I83813 Varicose veins of bilateral lower extremities with pain: Secondary | ICD-10-CM

## 2020-04-02 DIAGNOSIS — I890 Lymphedema, not elsewhere classified: Secondary | ICD-10-CM | POA: Diagnosis not present

## 2020-04-02 DIAGNOSIS — E785 Hyperlipidemia, unspecified: Secondary | ICD-10-CM

## 2020-04-02 DIAGNOSIS — Z9889 Other specified postprocedural states: Secondary | ICD-10-CM

## 2020-04-02 MED ORDER — AMOXICILLIN-POT CLAVULANATE 875-125 MG PO TABS: 1.0000 | ORAL_TABLET | Freq: Two times a day (BID) | ORAL | 0 refills | Status: DC

## 2020-04-03 ENCOUNTER — Other Ambulatory Visit (INDEPENDENT_AMBULATORY_CARE_PROVIDER_SITE_OTHER): Payer: Self-pay | Admitting: Nurse Practitioner

## 2020-04-03 MED ORDER — AMOXICILLIN-POT CLAVULANATE 875-125 MG PO TABS: 1.0000 | ORAL_TABLET | Freq: Two times a day (BID) | ORAL | 0 refills | Status: DC

## 2020-04-08 ENCOUNTER — Encounter (INDEPENDENT_AMBULATORY_CARE_PROVIDER_SITE_OTHER): Payer: Self-pay | Admitting: Nurse Practitioner

## 2020-04-08 NOTE — Progress Notes (Signed)
Subjective:    Patient ID: Ann Morales, female    DOB: 1975-05-29, 45 y.o.   MRN: [id number] Chief Complaint  Patient presents with  . Follow-up    L LE Reflux      Ann Morales is a 45 year old female that presents today for evaluation of her varicose veins.  The patient is a previous patient of this practice.  She had an endovenous laser ablation done on her left lower extremity in 2018.  The ablation at that time was successful and the patient also had several sessions of sclerotherapy.  Recently the patient noticed a new larger vein that has developed within the area of her thigh across her knee and down her calf.  This area is sore and tender to the touch especially as she stands and as the day progresses.  The patient has continued to utilize medical grade 1 compression stockings.  Elevation does help the pain and discomfort within the varicose vein.  The patient also endorses having some worsening swelling in the left lower extremity.  She denies any fever, chills, nausea, vomiting or diarrhea.  She denies any chest pain or shortness of breath.  Since the patient's last visit she has developed some wounds on her posterior calf which are red and painful.  The patient does have a history of psoriasis and has tried to use the ointment on these wounds however they have not gotten better in fact it hurts worse.  These wounds are painful as well.  Today noninvasive studies show evidence of reflux in the great saphenous vein at the saphenofemoral junction extending to the distal thigh.  He notes that the distal great saphenous vein and the knee shows a tortuous cluster that eventually connects to the calf great saphenous vein.   Review of Systems  Cardiovascular: Positive for leg swelling.  Skin: Positive for rash and wound.       Objective:   Physical Exam Vitals reviewed.  HENT:     Head: Normocephalic.  Cardiovascular:     Rate and Rhythm: Normal rate.  Pulmonary:      Effort: Pulmonary effort is normal.  Skin:    Findings: Lesion and rash present.  Neurological:     Mental Status: She is alert and oriented to person, place, and time.  Psychiatric:        Mood and Affect: Mood normal.        Behavior: Behavior normal.        Thought Content: Thought content normal.        Judgment: Judgment normal.     BP (!) 88/54   Pulse (!) 125   Ht 5\' 7"  (1.702 m)   Wt 245 lb (111.1 kg)   BMI 38.37 kg/m   Past Medical History:  Diagnosis Date  . ADHD (attention deficit hyperactivity disorder)   . Anxiety   . Arthritis   . Asthma    well controlled  . Bipolar disorder ([place])   . Depression   . GERD (gastroesophageal reflux disease)   . Headache    migraines  . History of kidney stones    currently as of 05-30-19  . Sleep apnea    cpap    Social History   Socioeconomic History  . Marital status: Single    Spouse name: Not on file  . Number of children: Not on file  . Years of education: Not on file  . Highest education level: Not on file  Occupational History  .  Not on file  Tobacco Use  . Smoking status: Current Every Day Smoker    Packs/day: 2.00    Years: 15.00    Pack years: 30.00    Types: Cigarettes    Start date: 09/25/1994  . Smokeless tobacco: Never Used  Vaping Use  . Vaping Use: Never used  Substance and Sexual Activity  . Alcohol use: No    Alcohol/week: 0.0 standard drinks  . Drug use: No  . Sexual activity: Yes    Birth control/protection: Surgical  Other Topics Concern  . Not on file  Social History Narrative  . Not on file   Social Determinants of Health   Financial Resource Strain: Not on file  Food Insecurity: Not on file  Transportation Needs: Not on file  Physical Activity: Not on file  Stress: Not on file  Social Connections: Not on file  Intimate Partner Violence: Not on file    Past Surgical History:  Procedure Laterality Date  . BACK SURGERY  03/2017   cervical fusion  . CHOLECYSTECTOMY    .  GALLBLADDER SURGERY    . HYSTEROSCOPY WITH NOVASURE N/A 06/02/2019   Procedure: DILATATION & HYSTEROSCOPY WITH NOVASURE ABLATION;  Surgeon: Malachy Mood, MD;  Location: [facility];  Service: Gynecology;  Laterality: N/A;  . LAPAROSCOPY N/A 12/21/2018   Procedure: LAPAROSCOPY DIAGNOSTIC;  Surgeon: Will Bonnet, MD;  Location: [facility];  Service: Gynecology;  Laterality: N/A;  . PLANTAR FASCIA RELEASE Left 06/12/2017   Procedure: ENDOSCOPIC PLANTAR FASCIOTOMY-RELEASE;  Surgeon: Samara Deist, DPM;  Location: [facility] OR[facility];  Service: Podiatry;  Laterality: Left;  . REPAIR EXTENSOR TENDON Left 06/12/2017   Procedure: REPAIR FLEXOR TENDON;  Surgeon: Samara Deist, DPM;  Location: [facility] OR[facility];  Service: Podiatry;  Laterality: Left;  . TARSAL TUNNEL RELEASE Left 06/12/2017   Procedure: TARSAL TUNNEL RELEASE-BAXTER RELEASE;  Surgeon: Samara Deist, DPM;  Location: [facility];  Service: Podiatry;  Laterality: Left;  . TUBAL LIGATION      Family History  Problem Relation Age of Onset  . Hypercholesterolemia Father   . Bipolar disorder Mother   . Hypercholesterolemia Maternal Grandmother   . Hypertension Maternal Grandmother   . Anxiety disorder Maternal Grandmother   . Depression Maternal Grandmother   . Ovarian cancer Maternal Grandmother   . Colon cancer Maternal Grandfather 62  . Ovarian cancer Paternal Grandmother     Allergies  Allergen Reactions  . Codeine Hives and Shortness Of Breath  . Azithromycin Diarrhea  . Doxycycline Nausea And Vomiting  . Sulfa Antibiotics Other (See Comments)    Severe abdominal pain.    CBC Latest Ref Rng & Units 08/12/2019 05/31/2019 05/02/2019  WBC 3.4 - 10.8 x10E3/uL 11.2(H) 9.0 -  Hemoglobin 11.1 - 15.9 g/dL 13.7 13.3 14.4  Hematocrit 34.0 - 46.6 % 41.9 42.0 -  Platelets 150 - 450 x10E3/uL 226 250 -      CMP     Component Value Date/Time   NA 140 08/12/2019 1527   NA 138 05/28/2013 0810   K 4.2 08/12/2019 1527   K 3.8 05/28/2013 0810   CL 104  08/12/2019 1527   CL 106 05/28/2013 0810   CO2 24 08/12/2019 1527   CO2 28 05/28/2013 0810   GLUCOSE 109 (H) 08/12/2019 1527   GLUCOSE 93 05/28/2013 0810   BUN 9 08/12/2019 1527   BUN 12 05/28/2013 0810   CREATININE 0.68 08/12/2019 1527   CREATININE 1.06 05/28/2013 0810   CALCIUM 9.0 08/12/2019 1527   CALCIUM 8.7  05/28/2013 0810   PROT 6.0 08/12/2019 1527   PROT 6.5 05/28/2013 0810   ALBUMIN 3.8 08/12/2019 1527   ALBUMIN 3.1 (L) 05/28/2013 0810   AST 11 08/12/2019 1527   AST 13 (L) 05/28/2013 0810   ALT 16 08/12/2019 1527   ALT 16 05/28/2013 0810   ALKPHOS 74 08/12/2019 1527   ALKPHOS 68 05/28/2013 0810   BILITOT <0.2 08/12/2019 1527   BILITOT 0.6 05/28/2013 0810   GFRNONAA 107 08/12/2019 1527   GFRNONAA >60 05/28/2013 0810   GFRAA 123 08/12/2019 1527   GFRAA >60 05/28/2013 0810     No results found.     Assessment & Plan:   1. Lymphedema No surgery or intervention at this point in time.    I have had a long discussion with the patient regarding venous insufficiency and why it  causes symptoms, specifically venous ulceration . I have discussed with the patient the chronic skin changes that accompany venous insufficiency and the long term sequela such as infection and recurring  ulceration.  Patient will be placed in [employer] which will be changed weekly drainage permitting.  In addition, behavioral modification including several periods of elevation of the lower extremities during the day will be continued. Achieving a position with the ankles at heart level was stressed to the patient  The patient is instructed to begin routine exercise, especially walking on a daily basis   The patient will return to the office on a weekly basis for wrap changes.  We will reevaluate the progression in 4 weeks.  We will also place the patient on antibiotics for the wound as well.  2. Varicose veins of bilateral lower extremities with pain Recommend  I have reviewed my previous   discussion with the patient regarding  varicose veins and why they cause symptoms. Patient will continue  wearing graduated compression stockings class 1 on a daily basis, beginning first thing in the morning and removing them in the evening.    In addition, behavioral modification including elevation during the day was again discussed and this will continue.  The patient has utilized over the counter pain medications and has been exercising.  However, at this time conservative therapy has not alleviated the patient's symptoms of leg pain and swelling  Recommend: laser ablation of the  left great saphenous veins to eliminate the symptoms of pain and swelling of the lower extremities caused by the severe superficial venous reflux disease.   3. Hyperlipidemia, unspecified hyperlipidemia type Continue statin as ordered and reviewed, no changes at this time    Current Outpatient Medications on File Prior to Visit  Medication Sig Dispense Refill  . albuterol (VENTOLIN HFA) 108 (90 Base) MCG/ACT inhaler Inhale 2 puffs into the lungs every 4 (four) hours as needed for wheezing or shortness of breath. 18 g 0  . amphetamine-dextroamphetamine (ADDERALL) 15 MG tablet Take 15 mg by mouth daily at 2 PM.   0  . buPROPion (WELLBUTRIN XL) 150 MG 24 hr tablet Take 150 mg by mouth every morning.   1  . chlorhexidine (PERIDEX) 0.12 % solution SMARTSIG:By Mouth    . clobetasol cream (TEMOVATE) [id number] % Apply 1 application topically 2 (two) times daily. Up to 5 days per week 30 g 0  . clonazePAM (KLONOPIN) 0.5 MG tablet Take 1 tablet (0.5 mg total) by mouth 2 (two) times daily as needed for anxiety. (Patient taking differently: Take 0.5 mg by mouth 2 (two) times daily.) 60 tablet 3  .  DOVONEX 0.005 % cream Apply topically 2 (two) times daily. 60 g 2  . fluconazole (DIFLUCAN) 150 MG tablet Take 1 tablet (150 mg total) by mouth every three (3) days as needed. 3 tablet 0  . fluticasone (FLONASE) 50 MCG/ACT nasal  spray Place 2 sprays into both nostrils daily. 16 g 6  . ibuprofen (ADVIL) 800 MG tablet Take 1 tablet (800 mg total) by mouth every 8 (eight) hours as needed. 30 tablet 0  . INVEGA SUSTENNA 234 MG/1.5ML SUSY injection Inject into the muscle.    . ipratropium (ATROVENT) 0.03 % nasal spray Place 2 sprays into both nostrils 3 (three) times daily as needed for rhinitis. 30 mL 0  . lidocaine (XYLOCAINE) 2 % solution SMARTSIG:By Mouth    . lisdexamfetamine (VYVANSE) 40 MG capsule Take 40 mg by mouth in the morning.     . lithium 300 MG tablet Take 300 mg by mouth 2 (two) times daily.  2  . lithium carbonate 300 MG capsule Take 300 mg by mouth 2 (two) times daily.    [name] Kitchen loratadine (CLARITIN) 10 MG tablet One daily as needed for allergies (Patient taking differently: Take 10 mg by mouth daily as needed for allergies.) 30 tablet 5  . montelukast (SINGULAIR) 10 MG tablet Take 1 tablet (10 mg total) by mouth at bedtime. 90 tablet 1  . nystatin (MYCOSTATIN) 100000 UNIT/ML suspension Take 5 mLs (500,000 Units total) by mouth 4 (four) times daily. 60 mL 0  . omeprazole (PRILOSEC) 20 MG capsule TAKE 1 CAPSULE BY MOUTH EVERY DAY 30 capsule 5  . ondansetron (ZOFRAN ODT) 8 MG disintegrating tablet Take 1 tablet (8 mg total) by mouth every 8 (eight) hours as needed for nausea or vomiting. 20 tablet 0  . paliperidone (INVEGA) 6 MG 24 hr tablet Take 6 mg by mouth at bedtime.    . predniSONE (STERAPRED UNI-PAK 21 TAB) 10 MG (21) TBPK tablet PO: Take 6 tablets on day 1:Take 5 tablets day 2:Take 4 tablets day 3: Take 3 tablets day 4:Take 2 tablets day five: 5 Take 1 tablet day 6 21 tablet 0  . sodium chloride (OCEAN) 0.65 % SOLN nasal spray Place 2 sprays into both nostrils as needed for congestion. 104 mL 0  . Spacer/Aero-Holding Chambers (AEROCHAMBER MV) inhaler Use as instructed 1 each 2  . SUMAtriptan (IMITREX) 50 MG tablet TAKE ONE TABLET AT ONSET OF HEADACHE MAY REPEAT IN 2 HOURS IF NOT BETTER 10 tablet 0  .  topiramate (TOPAMAX) 50 MG tablet Take 1 tablet (50 mg total) by mouth at bedtime. 90 tablet 0  . traZODone (DESYREL) 100 MG tablet Take 150 mg by mouth at bedtime.    . TRINTELLIX 10 MG TABS Take 10 mg by mouth every morning.   2  . LATUDA 40 MG TABS tablet Take 40 mg by mouth daily after supper.  (Patient not taking: Reported on 04/02/2020)  2  . [DISCONTINUED] mirabegron ER (MYRBETRIQ) 25 MG TB24 tablet Take 1 tablet (25 mg total) by mouth daily. 90 tablet 0   No current facility-administered medications on file prior to visit.    There are no Patient Instructions on file for this visit. No follow-ups on file.   Kris Hartmann, NP

## 2020-04-09 ENCOUNTER — Ambulatory Visit (INDEPENDENT_AMBULATORY_CARE_PROVIDER_SITE_OTHER): Payer: Medicaid Other | Admitting: Nurse Practitioner

## 2020-04-09 ENCOUNTER — Encounter (INDEPENDENT_AMBULATORY_CARE_PROVIDER_SITE_OTHER): Payer: Self-pay

## 2020-04-09 ENCOUNTER — Other Ambulatory Visit: Payer: Self-pay

## 2020-04-09 VITALS — BP 90/66 | HR 85 | Resp 16 | Wt 240.0 lb

## 2020-04-09 DIAGNOSIS — I890 Lymphedema, not elsewhere classified: Secondary | ICD-10-CM

## 2020-04-09 NOTE — Progress Notes (Signed)
Patient left leg was wrapped with xeroform,gauze,and coban  History of Present Illness  There is no documented history at this time  Assessments & Plan   There are no diagnoses linked to this encounter.    Additional instructions  Subjective:  Patient presents with venous ulcer of the Left lower extremity.    Procedure:  3 layer unna wrap was placed Left lower extremity.   Plan:   Follow up in one week.

## 2020-04-10 ENCOUNTER — Encounter (INDEPENDENT_AMBULATORY_CARE_PROVIDER_SITE_OTHER): Payer: Self-pay | Admitting: Nurse Practitioner

## 2020-04-10 NOTE — Progress Notes (Signed)
Is this ok to fill? 

## 2020-04-16 ENCOUNTER — Encounter (INDEPENDENT_AMBULATORY_CARE_PROVIDER_SITE_OTHER): Payer: Medicaid Other

## 2020-04-16 ENCOUNTER — Encounter (INDEPENDENT_AMBULATORY_CARE_PROVIDER_SITE_OTHER): Payer: Self-pay

## 2020-04-17 ENCOUNTER — Ambulatory Visit (INDEPENDENT_AMBULATORY_CARE_PROVIDER_SITE_OTHER): Payer: Medicaid Other | Admitting: Nurse Practitioner

## 2020-04-17 ENCOUNTER — Other Ambulatory Visit: Payer: Self-pay

## 2020-04-17 ENCOUNTER — Encounter: Payer: Self-pay | Admitting: Dermatology

## 2020-04-17 DIAGNOSIS — I890 Lymphedema, not elsewhere classified: Secondary | ICD-10-CM

## 2020-04-17 MED ORDER — TRIAMCINOLONE ACETONIDE 0.025 % EX OINT: 1.0000 "application " | TOPICAL_OINTMENT | Freq: Two times a day (BID) | CUTANEOUS | 0 refills | Status: DC

## 2020-04-23 ENCOUNTER — Ambulatory Visit (INDEPENDENT_AMBULATORY_CARE_PROVIDER_SITE_OTHER): Payer: Medicaid Other | Admitting: Nurse Practitioner

## 2020-05-06 ENCOUNTER — Encounter (INDEPENDENT_AMBULATORY_CARE_PROVIDER_SITE_OTHER): Payer: Self-pay

## 2020-05-06 NOTE — Progress Notes (Signed)
Patient from [facility] today.  Given triamcinolone cream to try to help with itching rash area.  Patient is also advised to touch base with dermatologist, patient will follow up.  Previous schedule.

## 2020-05-09 NOTE — Progress Notes (Signed)
    MRN : [id number]  Iya Hamed is a 45 y.o. (Jul 16, 1975) female who presents with chief complaint of painful varicose veins.    The patient's left lower extremity was sterilely prepped and draped.  The ultrasound machine was used to visualize the left great saphenous vein throughout its course.  A segment in the thigh was selected for access.  The saphenous vein was accessed without difficulty using ultrasound guidance with a micropuncture needle.   An 0.018  wire was placed beyond the saphenofemoral junction through the sheath and the microneedle was removed.  The 65 cm sheath was then placed over the wire and the wire and dilator were removed.  The laser fiber was placed through the sheath and its tip was placed approximately 2 cm below the saphenofemoral junction.  Tumescent anesthesia was then created with a dilute lidocaine solution.  Laser energy was then delivered with constant withdrawal of the sheath and laser fiber.  Approximately 532 Joules of energy were delivered over a length of 8 cm.  Sterile dressings were placed.  The patient tolerated the procedure well without complications.

## 2020-05-10 ENCOUNTER — Other Ambulatory Visit (INDEPENDENT_AMBULATORY_CARE_PROVIDER_SITE_OTHER): Payer: Self-pay | Admitting: Vascular Surgery

## 2020-05-10 ENCOUNTER — Other Ambulatory Visit: Payer: Self-pay

## 2020-05-10 ENCOUNTER — Encounter (INDEPENDENT_AMBULATORY_CARE_PROVIDER_SITE_OTHER): Payer: Self-pay | Admitting: Vascular Surgery

## 2020-05-10 ENCOUNTER — Ambulatory Visit (INDEPENDENT_AMBULATORY_CARE_PROVIDER_SITE_OTHER): Payer: Medicaid Other | Admitting: Vascular Surgery

## 2020-05-10 VITALS — BP 107/64 | HR 105 | Ht 67.0 in | Wt 244.0 lb

## 2020-05-10 DIAGNOSIS — I83813 Varicose veins of bilateral lower extremities with pain: Secondary | ICD-10-CM

## 2020-05-10 DIAGNOSIS — I83812 Varicose veins of left lower extremities with pain: Secondary | ICD-10-CM

## 2020-05-14 ENCOUNTER — Ambulatory Visit (INDEPENDENT_AMBULATORY_CARE_PROVIDER_SITE_OTHER): Payer: Medicaid Other

## 2020-05-14 ENCOUNTER — Other Ambulatory Visit: Payer: Self-pay

## 2020-05-14 DIAGNOSIS — I83812 Varicose veins of left lower extremities with pain: Secondary | ICD-10-CM | POA: Diagnosis not present

## 2020-05-16 ENCOUNTER — Ambulatory Visit: Payer: Medicaid Other | Admitting: Dermatology

## 2020-06-07 ENCOUNTER — Encounter: Payer: Self-pay | Admitting: Otolaryngology

## 2020-06-07 ENCOUNTER — Other Ambulatory Visit: Payer: Self-pay

## 2020-06-08 ENCOUNTER — Other Ambulatory Visit: Payer: Medicaid Other | Attending: Otolaryngology

## 2020-06-11 ENCOUNTER — Other Ambulatory Visit: Payer: Self-pay

## 2020-06-11 ENCOUNTER — Encounter (INDEPENDENT_AMBULATORY_CARE_PROVIDER_SITE_OTHER): Payer: Self-pay | Admitting: Vascular Surgery

## 2020-06-11 ENCOUNTER — Ambulatory Visit (INDEPENDENT_AMBULATORY_CARE_PROVIDER_SITE_OTHER): Payer: Medicaid Other | Admitting: Vascular Surgery

## 2020-06-11 VITALS — BP 111/78 | HR 78 | Ht 67.0 in | Wt 245.0 lb

## 2020-06-11 DIAGNOSIS — I872 Venous insufficiency (chronic) (peripheral): Secondary | ICD-10-CM

## 2020-06-11 DIAGNOSIS — E785 Hyperlipidemia, unspecified: Secondary | ICD-10-CM

## 2020-06-11 DIAGNOSIS — J4520 Mild intermittent asthma, uncomplicated: Secondary | ICD-10-CM

## 2020-06-11 DIAGNOSIS — I83813 Varicose veins of bilateral lower extremities with pain: Secondary | ICD-10-CM

## 2020-06-11 NOTE — Progress Notes (Signed)
MRN : [id number]  Ann Morales is a 45 y.o. ([birth year]) female who presents with chief complaint of No chief complaint on file. [name] Kitchen  History of Present Illness: The patient returns to the office for followup status post laser ablation of the left saphenous vein on 05/10/2020.  The patient note significant improvement in the lower extremity pain but not resolution of the symptoms. The patient notes multiple residual varicosities bilaterally which continued to hurt with dependent positions and remained tender to palpation. The patient's swelling is minimally from preoperative status. The patient continues to wear graduated compression stockings on a daily basis but these are not eliminating the pain and discomfort. The patient continues to use over-the-counter anti-inflammatory medications to treat the pain and related symptoms but this has not given the patient relief. The patient notes the pain in the lower extremities is causing problems with daily exercise, problems at work and even with household activities such as preparing meals and doing dishes.  The patient is otherwise done well and there have been no complications related to the laser procedure or interval changes in the patient's overall   Post laser ultrasound shows successful ablation of the left great saphenous vein.    No outpatient medications have been marked as taking for the 06/11/20 encounter (Appointment) with Delana Meyer, Dolores Lory, MD.    Past Medical History:  Diagnosis Date  . ADHD (attention deficit hyperactivity disorder)   . Anxiety   . Arthritis   . Asthma    well controlled  . Bipolar disorder ([place])   . COVID-19 03/29/2020  . Depression   . GERD (gastroesophageal reflux disease)   . Headache    migraines  . History of kidney stones    currently as of 05-30-19  . Sleep apnea    cpap  . Wears dentures    partial upper    Past Surgical History:  Procedure Laterality Date  . BACK SURGERY  03/2017    cervical fusion  . CHOLECYSTECTOMY    . GALLBLADDER SURGERY    . HYSTEROSCOPY WITH NOVASURE N/A 06/02/2019   Procedure: DILATATION & HYSTEROSCOPY WITH NOVASURE ABLATION;  Surgeon: Malachy Mood, MD;  Location: [facility];  Service: Gynecology;  Laterality: N/A;  . LAPAROSCOPY N/A 12/21/2018   Procedure: LAPAROSCOPY DIAGNOSTIC;  Surgeon: Will Bonnet, MD;  Location: [facility];  Service: Gynecology;  Laterality: N/A;  . PLANTAR FASCIA RELEASE Left 06/12/2017   Procedure: ENDOSCOPIC PLANTAR FASCIOTOMY-RELEASE;  Surgeon: Samara Deist, DPM;  Location: [facility];  Service: Podiatry;  Laterality: Left;  . REPAIR EXTENSOR TENDON Left 06/12/2017   Procedure: REPAIR FLEXOR TENDON;  Surgeon: Samara Deist, DPM;  Location: [facility];  Service: Podiatry;  Laterality: Left;  . TARSAL TUNNEL RELEASE Left 06/12/2017   Procedure: TARSAL TUNNEL RELEASE-BAXTER RELEASE;  Surgeon: Samara Deist, DPM;  Location: [facility];  Service: Podiatry;  Laterality: Left;  . TUBAL LIGATION      Social History Social History   Tobacco Use  . Smoking status: Current Every Day Smoker    Packs/day: 2.00    Years: 15.00    Pack years: 30.00    Types: Cigarettes    Start date: 09/25/1994  . Smokeless tobacco: Never Used  Vaping Use  . Vaping Use: Never used  Substance Use Topics  . Alcohol use: No    Alcohol/week: 0.0 standard drinks  . Drug use: No    Family History Family History  Problem Relation Age of Onset  . Hypercholesterolemia Father   .  Bipolar disorder Mother   . Hypercholesterolemia Maternal Grandmother   . Hypertension Maternal Grandmother   . Anxiety disorder Maternal Grandmother   . Depression Maternal Grandmother   . Ovarian cancer Maternal Grandmother   . Colon cancer Maternal Grandfather 71  . Ovarian cancer Paternal Grandmother     Allergies  Allergen Reactions  . Codeine Hives and Shortness Of Breath  . Azithromycin Diarrhea  . Calamine-Zinc Oxide Itching  . Doxycycline Nausea And  Vomiting  . Sulfa Antibiotics Other (See Comments)    Severe abdominal pain.  . Zinc Other (See Comments)    Burning sensation     REVIEW OF SYSTEMS (Negative unless checked)  Constitutional: [] Weight loss  [] Fever  [] Chills Cardiac: [] Chest pain   [] Chest pressure   [] Palpitations   [] Shortness of breath when laying flat   [] Shortness of breath with exertion. Vascular:  [] Pain in legs with walking   [x] Pain in legs at rest  [] History of DVT   [] Phlebitis   [x] Swelling in legs   [x] Varicose veins   [] Non-healing ulcers Pulmonary:   [] Uses home oxygen   [] Productive cough   [] Hemoptysis   [] Wheeze  [] COPD   [] Asthma Neurologic:  [] Dizziness   [] Seizures   [] History of stroke   [] History of TIA  [] Aphasia   [] Vissual changes   [] Weakness or numbness in arm   [] Weakness or numbness in leg Musculoskeletal:   [] Joint swelling   [] Joint pain   [] Low back pain Hematologic:  [] Easy bruising  [] Easy bleeding   [] Hypercoagulable state   [] Anemic Gastrointestinal:  [] Diarrhea   [] Vomiting  [] Gastroesophageal reflux/heartburn   [] Difficulty swallowing. Genitourinary:  [] Chronic kidney disease   [] Difficult urination  [] Frequent urination   [] Blood in urine Skin:  [x] Venous Rashes   [] Ulcers  Psychological:  [] History of anxiety   []  History of major depression.  Physical Examination  There were no vitals filed for this visit. There is no height or weight on file to calculate BMI. Gen: WD/WN, NAD Head: [place]/AT, No temporalis wasting.  Ear/Nose/Throat: Hearing grossly intact, nares w/o erythema or drainage Eyes: PER, EOMI, sclera nonicteric.  Neck: Supple, no large masses.   Pulmonary:  Good air movement, no audible wheezing bilaterally, no use of accessory muscles.  Cardiac: RRR, no JVD Vascular: Large varicosities present extensively greater than 10 mm left leg.  Mild venous stasis changes to the legs bilaterally.  2+ soft pitting edema Vessel Right Left  Radial Palpable Palpable   Gastrointestinal: Non-distended. No guarding/no peritoneal signs.  Musculoskeletal: M/S 5/5 throughout.  No deformity or atrophy.  Neurologic: CN 2-12 intact. Symmetrical.  Speech is fluent. Motor exam as listed above. Psychiatric: Judgment intact, Mood & affect appropriate for pt's clinical situation. Dermatologic: No rashes or ulcers noted.  No changes consistent with cellulitis. Lymph : No lichenification or skin changes of chronic lymphedema.  CBC Lab Results  Component Value Date   WBC 11.2 (H) 08/12/2019   HGB 13.7 08/12/2019   HCT 41.9 08/12/2019   MCV 87 08/12/2019   PLT 226 08/12/2019    BMET    Component Value Date/Time   NA 140 08/12/2019 1527   NA 138 05/28/2013 0810   K 4.2 08/12/2019 1527   K 3.8 05/28/2013 0810   CL 104 08/12/2019 1527   CL 106 05/28/2013 0810   CO2 24 08/12/2019 1527   CO2 28 05/28/2013 0810   GLUCOSE 109 (H) 08/12/2019 1527   GLUCOSE 93 05/28/2013 0810   BUN 9 08/12/2019 1527  BUN 12 05/28/2013 0810   CREATININE 0.68 08/12/2019 1527   CREATININE 1.06 05/28/2013 0810   CALCIUM 9.0 08/12/2019 1527   CALCIUM 8.7 05/28/2013 0810   GFRNONAA 107 08/12/2019 1527   GFRNONAA >60 05/28/2013 0810   GFRAA 123 08/12/2019 1527   GFRAA >60 05/28/2013 0810   CrCl cannot be calculated (Patient's most recent lab result is older than the maximum 21 days allowed.).  COAG No results found for: INR, PROTIME  Radiology VAS [place] LOWER EXTREMITY VENOUS POST ABLATION  Result Date: 05/14/2020  Lower Venous Study Performing Technologist: Charlane Ferretti RT (R)(VS)  Examination Guidelines: A complete evaluation includes B-mode imaging, spectral Doppler, color Doppler, and power Doppler as needed of all accessible portions of each vessel. Bilateral testing is considered an integral part of a complete examination. Limited examinations for reoccurring indications may be performed as noted.  +---------+---------------+---------+-----------+----------+--------------+  LEFT     CompressibilityPhasicitySpontaneityPropertiesThrombus Aging +---------+---------------+---------+-----------+----------+--------------+ CFV      Full                                                        +---------+---------------+---------+-----------+----------+--------------+ SFJ      Full                                                        +---------+---------------+---------+-----------+----------+--------------+ FV Prox  Full                                                        +---------+---------------+---------+-----------+----------+--------------+ FV Mid   Full                                                        +---------+---------------+---------+-----------+----------+--------------+ FV DistalFull                                                        +---------+---------------+---------+-----------+----------+--------------+ POP      Full                                                        +---------+---------------+---------+-----------+----------+--------------+ GSV      None                                                        +---------+---------------+---------+-----------+----------+--------------+ SSV      Full                                                        +---------+---------------+---------+-----------+----------+--------------+  Summary: Left: Left GSV is non compressable from knee to approximately 1.1cm from SFJ. Successful vein closure.  *See table(s) above for measurements and observations. Electronically signed by Hortencia Pilar MD on 05/14/2020 at 5:41:51 PM.    Final      Assessment/Plan 1. Varicose veins of bilateral lower extremities with pain Recommend:  The patient has had successful ablation of the previously incompetent left saphenous venous system but still has persistent symptoms of pain and swelling that are having a negative impact on daily life and daily  activities.  Patient should undergo injection sclerotherapy to treat the residual left leg varicosities.  The risks, benefits and alternative therapies were reviewed in detail with the patient.  All questions were answered.  The patient agrees to proceed with sclerotherapy at their convenience.  The patient will continue wearing the graduated compression stockings and using the over-the-counter pain medications to treat her symptoms.   2. Chronic venous insufficiency No surgery or intervention at this point in time.    I have had a long discussion with the patient regarding venous insufficiency and why it  causes symptoms. I have discussed with the patient the chronic skin changes that accompany venous insufficiency and the long term sequela such as infection and ulceration.  Patient will begin wearing graduated compression stockings class 1 (20-30 mmHg) or compression wraps on a daily basis a prescription was given. The patient will put the stockings on first thing in the morning and removing them in the evening. The patient is instructed specifically not to sleep in the stockings.    In addition, behavioral modification including several periods of elevation of the lower extremities during the day will be continued. I have demonstrated that proper elevation is a position with the ankles at heart level.  The patient is instructed to begin routine exercise, especially walking on a daily basis  3. Mild intermittent asthma without complication Continue pulmonary medications and aerosols as already ordered, these medications have been reviewed and there are no changes at this time.   4. Hyperlipidemia, unspecified hyperlipidemia type Continue statin as ordered and reviewed, no changes at this time    Hortencia Pilar, MD  06/11/2020 3:14 PM

## 2020-06-12 ENCOUNTER — Encounter: Payer: Self-pay | Admitting: Otolaryngology

## 2020-06-12 ENCOUNTER — Encounter
Admission: RE | Disposition: A | Discharge status: Home / Self Care | Payer: Self-pay | Source: Home / Self Care | Attending: Otolaryngology

## 2020-06-12 ENCOUNTER — Ambulatory Visit: Payer: Medicaid Other | Admitting: Anesthesiology

## 2020-06-12 ENCOUNTER — Encounter (INDEPENDENT_AMBULATORY_CARE_PROVIDER_SITE_OTHER): Payer: Self-pay | Admitting: Vascular Surgery

## 2020-06-12 ENCOUNTER — Other Ambulatory Visit: Payer: Self-pay

## 2020-06-12 ENCOUNTER — Ambulatory Visit
Admission: RE | Admit: 2020-06-12 | Discharge: 2020-06-12 | Disposition: A | Discharge status: Home / Self Care | Payer: Medicaid Other | Attending: Otolaryngology | Admitting: Otolaryngology

## 2020-06-12 DIAGNOSIS — J0141 Acute recurrent pansinusitis: Secondary | ICD-10-CM

## 2020-06-12 DIAGNOSIS — Z825 Family history of asthma and other chronic lower respiratory diseases: Secondary | ICD-10-CM | POA: Diagnosis not present

## 2020-06-12 DIAGNOSIS — Z79899 Other long term (current) drug therapy: Secondary | ICD-10-CM | POA: Insufficient documentation

## 2020-06-12 DIAGNOSIS — Z8249 Family history of ischemic heart disease and other diseases of the circulatory system: Secondary | ICD-10-CM | POA: Insufficient documentation

## 2020-06-12 DIAGNOSIS — Z885 Allergy status to narcotic agent status: Secondary | ICD-10-CM | POA: Insufficient documentation

## 2020-06-12 DIAGNOSIS — F17200 Nicotine dependence, unspecified, uncomplicated: Secondary | ICD-10-CM | POA: Diagnosis not present

## 2020-06-12 DIAGNOSIS — Z809 Family history of malignant neoplasm, unspecified: Secondary | ICD-10-CM | POA: Diagnosis not present

## 2020-06-12 DIAGNOSIS — J329 Chronic sinusitis, unspecified: Secondary | ICD-10-CM | POA: Diagnosis present

## 2020-06-12 DIAGNOSIS — Z882 Allergy status to sulfonamides status: Secondary | ICD-10-CM | POA: Insufficient documentation

## 2020-06-12 DIAGNOSIS — Z7951 Long term (current) use of inhaled steroids: Secondary | ICD-10-CM | POA: Insufficient documentation

## 2020-06-12 HISTORY — DX: Presence of dental prosthetic device (complete) (partial): Z97.2

## 2020-06-12 HISTORY — PX: ETHMOIDECTOMY: SHX5197

## 2020-06-12 HISTORY — PX: MAXILLARY ANTROSTOMY: SHX2003

## 2020-06-12 HISTORY — PX: FRONTAL SINUS EXPLORATION: SHX6591

## 2020-06-12 HISTORY — PX: IMAGE GUIDED SINUS SURGERY: SHX6570

## 2020-06-12 LAB — POCT PREGNANCY, URINE: Preg Test, Ur: NEGATIVE

## 2020-06-12 SURGERY — SINUS SURGERY, WITH IMAGING GUIDANCE
Anesthesia: General | Site: Nose | Laterality: Right

## 2020-06-12 MED ORDER — CEFDINIR 300 MG PO CAPS
300.0000 mg | ORAL_CAPSULE | Freq: Two times a day (BID) | ORAL | 0 refills | Status: DC
Start: 2020-06-12 — End: 2021-10-13

## 2020-06-12 MED ORDER — SUCCINYLCHOLINE CHLORIDE 20 MG/ML IJ SOLN
INTRAMUSCULAR | Status: DC | PRN
  Administered 2020-06-12: 140 mg via INTRAVENOUS

## 2020-06-12 MED ORDER — OXYMETAZOLINE HCL 0.05 % NA SOLN
NASAL | Status: DC | PRN
  Administered 2020-06-12: 1 via TOPICAL

## 2020-06-12 MED ORDER — ACETAMINOPHEN 10 MG/ML IV SOLN
1000.0000 mg | Freq: Once | INTRAVENOUS | Status: AC
  Administered 2020-06-12: 1000 mg via INTRAVENOUS

## 2020-06-12 MED ORDER — FENTANYL CITRATE (PF) 100 MCG/2ML IJ SOLN
INTRAMUSCULAR | Status: DC | PRN
  Administered 2020-06-12 (×2): 50 ug via INTRAVENOUS

## 2020-06-12 MED ORDER — ONDANSETRON HCL 4 MG/2ML IJ SOLN
INTRAMUSCULAR | Status: DC | PRN
  Administered 2020-06-12: 4 mg via INTRAVENOUS

## 2020-06-12 MED ORDER — LIDOCAINE HCL (CARDIAC) PF 100 MG/5ML IV SOSY
PREFILLED_SYRINGE | INTRAVENOUS | Status: DC | PRN
  Administered 2020-06-12: 60 mg via INTRAVENOUS

## 2020-06-12 MED ORDER — LACTATED RINGERS IV SOLN: INTRAVENOUS | Status: DC

## 2020-06-12 MED ORDER — GLYCOPYRROLATE 0.2 MG/ML IJ SOLN
INTRAMUSCULAR | Status: DC | PRN
  Administered 2020-06-12: .2 mg via INTRAVENOUS

## 2020-06-12 MED ORDER — PREDNISONE 10 MG (21) PO TBPK: ORAL_TABLET | ORAL | 0 refills | Status: DC

## 2020-06-12 MED ORDER — FENTANYL CITRATE (PF) 100 MCG/2ML IJ SOLN
25.0000 ug | INTRAMUSCULAR | Status: DC | PRN
Start: 2020-06-12 — End: 2020-06-12

## 2020-06-12 MED ORDER — PROPOFOL 10 MG/ML IV BOLUS
INTRAVENOUS | Status: DC | PRN
  Administered 2020-06-12: 160 mg via INTRAVENOUS

## 2020-06-12 MED ORDER — HYDROCODONE-ACETAMINOPHEN 5-325 MG PO TABS: 1.0000 | ORAL_TABLET | Freq: Four times a day (QID) | ORAL | 0 refills | Status: DC | PRN

## 2020-06-12 MED ORDER — LIDOCAINE-EPINEPHRINE 1 %-1:100000 IJ SOLN
INTRAMUSCULAR | Status: DC | PRN
  Administered 2020-06-12: 7 mL

## 2020-06-12 MED ORDER — SCOPOLAMINE 1 MG/3DAYS TD PT72
1.0000 | MEDICATED_PATCH | TRANSDERMAL | Status: DC
  Administered 2020-06-12: 1.5 mg via TRANSDERMAL

## 2020-06-12 MED ORDER — MIDAZOLAM HCL 5 MG/5ML IJ SOLN
INTRAMUSCULAR | Status: DC | PRN
  Administered 2020-06-12: 2 mg via INTRAVENOUS

## 2020-06-12 MED ORDER — DEXAMETHASONE SODIUM PHOSPHATE 4 MG/ML IJ SOLN
INTRAMUSCULAR | Status: DC | PRN
  Administered 2020-06-12: 4 mg via INTRAVENOUS

## 2020-06-12 SURGICAL SUPPLY — 23 items
BATTERY INSTRU NAVIGATION (MISCELLANEOUS) ×9 IMPLANT
BTRY SRG DRVR LF (MISCELLANEOUS) ×6
CANISTER SUCT 1200ML W/VALVE (MISCELLANEOUS) ×3 IMPLANT
COAG SUCT 10F 3.5MM HAND CTRL (MISCELLANEOUS) ×3 IMPLANT
ELECT REM PT RETURN 9FT ADLT (ELECTROSURGICAL) ×3
ELECTRODE REM PT RTRN 9FT ADLT (ELECTROSURGICAL) ×2 IMPLANT
GLOVE SURG ENC MOIS LTX SZ7.5 (GLOVE) ×6 IMPLANT
GOWN STRL REUS W/ TWL LRG LVL3 (GOWN DISPOSABLE) ×2 IMPLANT
GOWN STRL REUS W/TWL LRG LVL3 (GOWN DISPOSABLE) ×3
IV NS 500ML (IV SOLUTION) ×3
IV NS 500ML BAXH (IV SOLUTION) ×2 IMPLANT
KIT TURNOVER KIT A (KITS) ×3 IMPLANT
NS IRRIG 500ML POUR BTL (IV SOLUTION) ×3 IMPLANT
PACK ENT CUSTOM (PACKS) ×3 IMPLANT
PACKING NASAL EPIS 4X2.4 XEROG (MISCELLANEOUS) ×1 IMPLANT
PATTIES SURGICAL .5 X3 (DISPOSABLE) ×3 IMPLANT
SHAVER DIEGO BLD STD TYPE A (BLADE) ×3 IMPLANT
SOL ANTI-FOG 6CC FOG-OUT (MISCELLANEOUS) ×2 IMPLANT
SOL FOG-OUT ANTI-FOG 6CC (MISCELLANEOUS) ×1
SYR 10ML LL (SYRINGE) ×3 IMPLANT
TRACKER CRANIALMASK (MASK) ×3 IMPLANT
TUBING DECLOG MULTIDEBRIDER (TUBING) ×3 IMPLANT
WATER STERILE IRR 250ML POUR (IV SOLUTION) ×1 IMPLANT

## 2020-06-12 NOTE — Transfer of Care (Signed)
Immediate Anesthesia Transfer of Care Note  Patient: Ann Morales  Procedure(s) Performed: IMAGE GUIDED SINUS SURGERY (N/A Nose) FRONTAL SINUS EXPLORATION (Right Nose) TOTAL ETHMOIDECTOMY (Right Nose) MAXILLARY ANTROSTOMY (Right Nose)  Patient Location: PACU  Anesthesia Type: General ETT  Level of Consciousness: awake, alert  and patient cooperative  Airway and Oxygen Therapy: Patient Spontanous Breathing and Patient connected to supplemental oxygen  Post-op Assessment: Post-op Vital signs reviewed, Patient's Cardiovascular Status Stable, Respiratory Function Stable, Patent Airway and No signs of Nausea or vomiting  Post-op Vital Signs: Reviewed and stable  Complications: No complications documented.

## 2020-06-12 NOTE — Anesthesia Preprocedure Evaluation (Signed)
Anesthesia Evaluation  Patient identified by MRN, date of birth, ID band Patient awake    Reviewed: NPO status   History of Anesthesia Complications Negative for: history of anesthetic complications  Airway Mallampati: II  TM Distance: >3 FB Neck ROM: full    Dental  (+) Upper Dentures   Pulmonary asthma (mild) , sleep apnea and Continuous Positive Airway Pressure Ventilation , Current Smoker,    Pulmonary exam normal        Cardiovascular Exercise Tolerance: Good negative cardio ROS Normal cardiovascular exam  Lymphedeman legs   Neuro/Psych  Headaches, PSYCHIATRIC DISORDERS Anxiety Depression Bipolar Disorder adhd negative neurological ROS  negative psych ROS   GI/Hepatic Neg liver ROS, GERD  Controlled,  Endo/Other  Morbid obesity (bmi 38)  Renal/GU negative Renal ROS  negative genitourinary   Musculoskeletal  (+) Arthritis ,   Abdominal   Peds  Hematology negative hematology ROS (+)   Anesthesia Other Findings 03/29/20: covid+;  Hcg: NEG.  Reproductive/Obstetrics                             Anesthesia Physical Anesthesia Plan  ASA: III  Anesthesia Plan: General ETT   Post-op Pain Management:    Induction:   PONV Risk Score and Plan: 3 and Midazolam, Ondansetron and Scopolamine patch - Pre-op  Airway Management Planned:   Additional Equipment:   Intra-op Plan:   Post-operative Plan:   Informed Consent: I have reviewed the patients History and Physical, chart, labs and discussed the procedure including the risks, benefits and alternatives for the proposed anesthesia with the patient or authorized representative who has indicated his/her understanding and acceptance.       Plan Discussed with: CRNA  Anesthesia Plan Comments:         Anesthesia Quick Evaluation

## 2020-06-12 NOTE — Anesthesia Procedure Notes (Signed)
Procedure Name: Intubation Date/Time: 06/12/2020 8:51 AM Performed by: Silvana Newness, CRNA Pre-anesthesia Checklist: Patient identified, Patient being monitored, Timeout performed, Emergency Drugs available and Suction available Patient Re-evaluated:Patient Re-evaluated prior to induction Oxygen Delivery Method: Circle system utilized Preoxygenation: Pre-oxygenation with 100% oxygen Induction Type: IV induction Ventilation: Mask ventilation without difficulty Laryngoscope Size: Mac and 3 Grade View: Grade I Tube type: Oral Rae Tube size: 7.0 mm Number of attempts: 1 Airway Equipment and Method: Stylet Placement Confirmation: ETT inserted through vocal cords under direct vision,  positive ETCO2 and breath sounds checked- equal and bilateral Tube secured with: Tape Dental Injury: Teeth and Oropharynx as per pre-operative assessment

## 2020-06-12 NOTE — Anesthesia Postprocedure Evaluation (Signed)
Anesthesia Post Note  Patient: Ann Morales  Procedure(s) Performed: IMAGE GUIDED SINUS SURGERY (N/A Nose) FRONTAL SINUS EXPLORATION (Right Nose) TOTAL ETHMOIDECTOMY (Right Nose) MAXILLARY ANTROSTOMY (Right Nose)     Patient location during evaluation: PACU Anesthesia Type: General Level of consciousness: awake and alert Pain management: pain level controlled Vital Signs Assessment: post-procedure vital signs reviewed and stable Respiratory status: spontaneous breathing, nonlabored ventilation, respiratory function stable and patient connected to nasal cannula oxygen Cardiovascular status: blood pressure returned to baseline and stable Postop Assessment: no apparent nausea or vomiting Anesthetic complications: no   No complications documented.  Fidel Levy

## 2020-06-12 NOTE — H&P (Signed)
History and physical reviewed and will be scanned in later. No change in medical status reported by the patient or family, appears stable for surgery. All questions regarding the procedure answered, and patient (or family if a child) expressed understanding of the procedure. ? ?Ann Morales Ann Morales ?@TODAY@ ?

## 2020-06-12 NOTE — Discharge Instructions (Signed)
[facility] ENDOSCOPIC SINUS SURGERY [facility]  What is Functional Endoscopic Sinus Surgery?  The Surgery involves making the natural openings of the sinuses larger by removing the bony partitions that separate the sinuses from the nasal cavity.  The natural sinus lining is preserved as much as possible to allow the sinuses to resume normal function after the surgery.  In some patients nasal polyps (excessively swollen lining of the sinuses) may be removed to relieve obstruction of the sinus openings.  The surgery is performed through the nose using lighted scopes, which eliminates the need for incisions on the face.  A septoplasty is a different procedure which is sometimes performed with sinus surgery.  It involves straightening the boy partition that separates the two sides of your nose.  A crooked or deviated septum may need repair if is obstructing the sinuses or nasal airflow.  Turbinate reduction is also often performed during sinus surgery.  The turbinates are bony proturberances from the side walls of the nose which swell and can obstruct the nose in patients with sinus and allergy problems.  Their size can be surgically reduced to help relieve nasal obstruction.  What Can Sinus Surgery Do For Me?  Sinus surgery can reduce the frequency of sinus infections requiring antibiotic treatment.  This can provide improvement in nasal congestion, post-nasal drainage, facial pressure and nasal obstruction.  Surgery will NOT prevent you from ever having an infection again, so it usually only for patients who get infections 4 or more times yearly requiring antibiotics, or for infections that do not clear with antibiotics.  It will not cure nasal allergies, so patients with allergies may still require medication to treat their allergies after surgery. Surgery may improve headaches related to sinusitis, however, some people will continue to  require medication to control sinus headaches related to allergies.  Surgery will do nothing for other forms of headache (migraine, tension or cluster).  What Are the Risks of Endoscopic Sinus Surgery?  Current techniques allow surgery to be performed safely with little risk, however, there are rare complications that patients should be aware of.  Because the sinuses are located around the eyes, there is risk of eye injury, including blindness, though again, this would be quite rare. This is usually a result of bleeding behind the eye during surgery, which puts the vision oat risk, though there are treatments to protect the vision and prevent permanent disrupted by surgery causing a leak of the spinal fluid that surrounds the brain.  More serious complications would include bleeding inside the brain cavity or damage to the brain.  Again, all of these complications are uncommon, and spinal fluid leaks can be safely managed surgically if they occur.  The most common complication of sinus surgery is bleeding from the nose, which may require packing or cauterization of the nose.  Continued sinus have polyps may experience recurrence of the polyps requiring revision surgery.  Alterations of sense of smell or injury to the tear ducts are also rare complications.   What is the Surgery Like, and what is the Recovery?  The Surgery usually takes a couple of hours to perform, and is usually performed under a general anesthetic (completely asleep).  Patients are usually discharged home after a couple of hours.  Sometimes during surgery it is necessary to pack the nose to control bleeding, and the packing is left in place for 24 - 48 hours, and removed by your surgeon.    If a septoplasty was performed during the procedure, there is often a splint placed which must be removed after 5-7 days.   Discomfort: Pain is usually mild to moderate, and can be controlled by prescription pain medication or acetaminophen (Tylenol).   Aspirin, Ibuprofen (Advil, Motrin), or Naprosyn (Aleve) should be avoided, as they can cause increased bleeding.  Most patients feel sinus pressure like they have a bad head cold for several days.  Sleeping with your head elevated can help reduce swelling and facial pressure, as can ice packs over the face.  A humidifier may be helpful to keep the mucous and blood from drying in the nose.   Diet: There are no specific diet restrictions, however, you should generally start with clear liquids and a light diet of bland foods because the anesthetic can cause some nausea.  Advance your diet depending on how your stomach feels.  Taking your pain medication with food will often help reduce stomach upset which pain medications can cause.  Nasal Saline Irrigation: It is important to remove blood clots and dried mucous from the nose as it is healing.  This is done by having you irrigate the nose at least 3 - 4 times daily with a salt water solution.  We recommend using NeilMed Sinus Rinse (available at the drug store).  Fill the squeeze bottle with the solution, bend over a sink, and insert the tip of the squeeze bottle into the nose  of an inch.  Point the tip of the squeeze bottle towards the inside corner of the eye on the same side your irrigating.  Squeeze the bottle and gently irrigate the nose.  If you bend forward as you do this, most of the fluid will flow back out of the nose, instead of down your throat.   The solution should be warm, near body temperature, when you irrigate.   Each time you irrigate, you should use a full squeeze bottle.   Note that if you are instructed to use Nasal Steroid Sprays at any time after your surgery, irrigate with saline BEFORE using the steroid spray, so you do not wash it all out of the nose. Another product, Nasal Saline Gel (such as AYR Nasal Saline Gel) can be applied in each nostril 3 - 4 times daily to moisture the nose and reduce scabbing or crusting.  Bleeding:   Bloody drainage from the nose can be expected for several days, and patients are instructed to irrigate their nose frequently with salt water to help remove mucous and blood clots.  The drainage may be dark red or brown, though some fresh blood may be seen intermittently, especially after irrigation.  Do not blow you nose, as bleeding may occur. If you must sneeze, keep your mouth open to allow air to escape through your mouth.  If heavy bleeding occurs: Irrigate the nose with saline to rinse out clots, then spray the nose 3 - 4 times with Afrin Nasal Decongestant Spray.  The spray will constrict the blood vessels to slow bleeding.  Pinch the lower half of your nose shut to apply pressure, and lay down with your head elevated.  Ice packs over the nose may help as well. If bleeding persists despite these measures, you should notify your doctor.  Do not use the Afrin routinely to control nasal congestion after surgery, as it can result in worsening congestion and may affect healing.     Activity: Return to work varies among patients. Most patients will be   out of work at least 5 - 7 days to recover.  Patient may return to work after they are off of narcotic pain medication, and feeling well enough to perform the functions of their job.  Patients must avoid heavy lifting (over 10 pounds) or strenuous physical for 2 weeks after surgery, so your employer may need to assign you to light duty, or keep you out of work longer if light duty is not possible.  NOTE: you should not drive, operate dangerous machinery, do any mentally demanding tasks or make any important legal or financial decisions while on narcotic pain medication and recovering from the general anesthetic.    Call Your Doctor Immediately if You Have Any of the Following: 1. Bleeding that you cannot control with the above measures 2. Loss of vision, double vision, bulging of the eye or black eyes. 3. Fever over 101 degrees 4. Neck stiffness with  severe headache, fever, nausea and change in mental state. You are always encourage to call anytime with concerns, however, please call with requests for pain medication refills during office hours.  Office Endoscopy: During follow-up visits your doctor will remove any packing or splints that may have been placed and evaluate and clean your sinuses endoscopically.  Topical anesthetic will be used to make this as comfortable as possible, though you may want to take your pain medication prior to the visit.  How often this will need to be done varies from patient to patient.  After complete recovery from the surgery, you may need follow-up endoscopy from time to time, particularly if there is concern of recurrent infection or nasal polyps.  General Anesthesia, Adult, Care After This sheet gives you information about how to care for yourself after your procedure. Your health care provider may also give you more specific instructions. If you have problems or questions, contact your health care provider. What can I expect after the procedure? After the procedure, the following side effects are common:  Pain or discomfort at the IV site.  Nausea.  Vomiting.  Sore throat.  Trouble concentrating.  Feeling cold or chills.  Feeling weak or tired.  Sleepiness and fatigue.  Soreness and body aches. These side effects can affect parts of the body that were not involved in surgery. Follow these instructions at home: For the time period you were told by your health care provider:  Rest.  Do not participate in activities where you could fall or become injured.  Do not drive or use machinery.  Do not drink alcohol.  Do not take sleeping pills or medicines that cause drowsiness.  Do not make important decisions or sign legal documents.  Do not take care of children on your own.   Eating and drinking  Follow any instructions from your health care provider about eating or drinking  restrictions.  When you feel hungry, start by eating small amounts of foods that are soft and easy to digest (bland), such as toast. Gradually return to your regular diet.  Drink enough fluid to keep your urine pale yellow.  If you vomit, rehydrate by drinking water, juice, or clear broth. General instructions  If you have sleep apnea, surgery and certain medicines can increase your risk for breathing problems. Follow instructions from your health care provider about wearing your sleep device: ? Anytime you are sleeping, including during daytime naps. ? While taking prescription pain medicines, sleeping medicines, or medicines that make you drowsy.  Have a responsible adult stay with you for the  time you are told. It is important to have someone help care for you until you are awake and alert.  Return to your normal activities as told by your health care provider. Ask your health care provider what activities are safe for you.  Take over-the-counter and prescription medicines only as told by your health care provider.  If you smoke, do not smoke without supervision.  Keep all follow-up visits as told by your health care provider. This is important. Contact a health care provider if:  You have nausea or vomiting that does not get better with medicine.  You cannot eat or drink without vomiting.  You have pain that does not get better with medicine.  You are unable to pass urine.  You develop a skin rash.  You have a fever.  You have redness around your IV site that gets worse. Get help right away if:  You have difficulty breathing.  You have chest pain.  You have blood in your urine or stool, or you vomit blood. Summary  After the procedure, it is common to have a sore throat or nausea. It is also common to feel tired.  Have a responsible adult stay with you for the time you are told. It is important to have someone help care for you until you are awake and  alert.  When you feel hungry, start by eating small amounts of foods that are soft and easy to digest (bland), such as toast. Gradually return to your regular diet.  Drink enough fluid to keep your urine pale yellow.  Return to your normal activities as told by your health care provider. Ask your health care provider what activities are safe for you. This information is not intended to replace advice given to you by your health care provider. Make sure you discuss any questions you have with your health care provider. Document Revised: 11/10/2019 Document Reviewed: 06/09/2019 Elsevier Patient Education  2021 [place].   Scopolamine skin patches Remove in 72 hrs. Wash hands immediately after removal. What is this medicine? SCOPOLAMINE (skoe POL a meen) is used to prevent nausea and vomiting caused by motion sickness, anesthesia and surgery. This medicine may be used for other purposes; ask your health care provider or pharmacist if you have questions. COMMON BRAND NAME(S): Transderm Scop What should I tell my health care provider before I take this medicine? They need to know if you have any of these conditions:  are scheduled to have a gastric secretion test  glaucoma  heart disease  kidney disease  liver disease  lung or breathing disease, like asthma  mental illness  prostate disease  seizures  stomach or intestine problems  trouble passing urine  an unusual or allergic reaction to scopolamine, atropine, other medicines, foods, dyes, or preservatives  pregnant or trying to get pregnant  breast-feeding How should I use this medicine? This medicine is for external use only. Follow the directions on the prescription label. Wear only 1 patch at a time. Choose an area behind the ear, that is clean, dry, hairless and free from any cuts or irritation. Wipe the area with a clean dry tissue. Peel off the plastic backing of the skin patch, trying not to touch the  adhesive side with your hands. Do not cut the patches. Firmly apply to the area you have chosen, with the metallic side of the patch to the skin and the tan-colored side showing. Once firmly in place, wash your hands well with soap and  water. Do not get this medicine into your eyes. After removing the patch, wash your hands and the area behind your ear thoroughly with soap and water. The patch will still contain some medicine after use. To avoid accidental contact or ingestion by children or pets, fold the used patch in half with the sticky side together and throw away in the trash out of the reach of children and pets. If you need to use a second patch after you remove the first, place it behind the other ear. A special MedGuide will be given to you by the pharmacist with each prescription and refill. Be sure to read this information carefully each time. Talk to your pediatrician regarding the use of this medicine in children. Special care may be needed. Overdosage: If you think you have taken too much of this medicine contact a poison control center or emergency room at once. NOTE: This medicine is only for you. Do not share this medicine with others. What if I miss a dose? This does not apply. This medicine is not for regular use. What may interact with this medicine?  alcohol  antihistamines for allergy cough and cold  atropine  certain medicines for anxiety or sleep  certain medicines for bladder problems like oxybutynin, tolterodine  certain medicines for depression like amitriptyline, fluoxetine, sertraline  certain medicines for stomach problems like dicyclomine, hyoscyamine  certain medicines for Parkinson's disease like benztropine, trihexyphenidyl  certain medicines for seizures like phenobarbital, primidone  general anesthetics like halothane, isoflurane, methoxyflurane, propofol  ipratropium  local anesthetics like lidocaine, pramoxine, tetracaine  medicines that relax  muscles for surgery  phenothiazines like chlorpromazine, mesoridazine, prochlorperazine, thioridazine  narcotic medicines for pain  other belladonna alkaloids This list may not describe all possible interactions. Give your health care provider a list of all the medicines, herbs, non-prescription drugs, or dietary supplements you use. Also tell them if you smoke, drink alcohol, or use illegal drugs. Some items may interact with your medicine. What should I watch for while using this medicine? Limit contact with water while swimming and bathing because the patch may fall off. If the patch falls off, throw it away and put a new one behind the other ear. You may get drowsy or dizzy. Do not drive, use machinery, or do anything that needs mental alertness until you know how this medicine affects you. Do not stand or sit up quickly, especially if you are an older patient. This reduces the risk of dizzy or fainting spells. Alcohol may interfere with the effect of this medicine. Avoid alcoholic drinks. Your mouth may get dry. Chewing sugarless gum or sucking hard candy, and drinking plenty of water may help. Contact your healthcare professional if the problem does not go away or is severe. This medicine may cause dry eyes and blurred vision. If you wear contact lenses, you may feel some discomfort. Lubricating drops may help. See your healthcare professional if the problem does not go away or is severe. If you are going to need surgery, an MRI, CT scan, or other procedure, tell your healthcare professional that you are using this medicine. You may need to remove the patch before the procedure. What side effects may I notice from receiving this medicine? Side effects that you should report to your doctor or health care professional as soon as possible:  allergic reactions like skin rash, itching or hives; swelling of the face, lips, or tongue  blurred vision  changes in  vision  confusion  dizziness  eye pain  fast, irregular heartbeat  hallucinations, loss of contact with reality  nausea, vomiting  pain or trouble passing urine  restlessness  seizures  skin irritation  stomach pain Side effects that usually do not require medical attention (report to your doctor or health care professional if they continue or are bothersome):  drowsiness  dry mouth  headache  sore throat This list may not describe all possible side effects. Call your doctor for medical advice about side effects. You may report side effects to FDA at 1-800-FDA-1088. Where should I keep my medicine? Keep out of the reach of children. Store at room temperature between 20 and 25 degrees C (68 and 77 degrees F). Keep this medicine in the foil package until ready to use. Throw away any unused medicine after the expiration date. NOTE: This sheet is a summary. It may not cover all possible information. If you have questions about this medicine, talk to your doctor, pharmacist, or health care provider.  2021 Elsevier/Gold Standard (2017-05-15 16:14:46)

## 2020-06-12 NOTE — Op Note (Signed)
06/12/2020  10:27 AM    Ann Morales  [id number]   Pre-Op Diagnosis:  chronic sinusitis Post-op Diagnosis: chronic sinusitis  Procedure:  1)  Image Guided Sinus Surgery,   2)  Right Endoscopic Maxillary Antrostomy with Tissue Removal   3)  Right Frontal Sinusotomy   4)  Right Total Ethmoidectomy      Surgeon:  Riley Nearing  Anesthesia:  General endotracheal  EBL:  34VQ  Complications:  None  Findings: Polypoid mucosal inflammation in the right paranasal sinuses  Procedure: After the patient was identified in holding and the benefits of the procedure were reviewed as well as the consent and risks, the patient was taken to the operating room and with the patient in a comfortable supine position,  general orotracheal anesthesia was induced without difficulty.  A proper time-out was performed.  The [employer] image guidance system was set up and calibrated in the normal fashion and felt to be acceptable.  Next 1% Xylocaine with 1:100,000 epinephrine was infiltrated into the right anterior middle turbinate and uncinate.  Several minutes were allowed for this to take effect.  Cottoniod pledgets soaked in Afrin were placed into the right nasal cavity and left while the patient was prepped and draped in the standard fashion. The image guided suction was calibrated and used to inspect known points in the nasal cavity to assess accuracy of the image guided system. Accuracy was felt to be excellent.   The right middle turbinate was medialized and the uncinate process then resected with through-cutting forceps as well as the microdebrider. In this fashion the uncinate was completely removed along with soft tissue and bone of the medial wall of the maxillary sinus to create a large patent maxillary antrostomy. The right maxillary sinus was suctioned to clear secretions.   Next the right anterior ethmoid sinuses were dissected beginning inferomedially, entering the ethmoid bulla. Thru cut  forceps were used to open the anterior ethmoids. The microdebrider was used as needed to trim loose mucosal edges.  Next the basal lamella was entered and, working back in a sequential fashion through the ethmoid air cells, the ethmoid sinuses were dissected to the posterior ethmoid sinuses, utilizing the image guided suction and the whole time to reassess the anatomy frequently. Thru cutting forceps were used for this dissection. Care was taken to avoid injury to the lamina papyracea laterally and the skull base superiorly.   Dissection proceeded anteriorly and superiorly into the right frontal recess which was dissected utilizing a 30 and then a 70 scope and frontal curved instruments. The curved image guided suction was used during this dissection to frequently reassess the anatomy on the CT scan. The frontal recess was dissected until a suction could be passed up into the region of the frontal sinus.   The nose was suctioned and inspected. The maxillary sinus was irrigated with saline. Xerogel absorbable sinus packing was then placed in the right ethmoid cavity.   The patient was then returned to the anesthesiologist for awakening and taken to recovery room in good condition postoperatively.  Disposition:   PACU and d/c home  Plan: Ice, elevation, narcotic analgesia and prophylactic antibiotics. Begin sinus irrigations with saline tomrrow, irrigating 3-4 times daily. Return to the office in 7 days.  Return to work in 7-10 days, no strenuous activities for two weeks.   Riley Nearing 06/12/2020 10:27 AM

## 2020-06-13 ENCOUNTER — Encounter: Payer: Self-pay | Admitting: Otolaryngology

## 2020-06-13 LAB — SURGICAL PATHOLOGY

## 2020-06-27 ENCOUNTER — Other Ambulatory Visit: Payer: Self-pay | Admitting: Neurosurgery

## 2020-06-27 DIAGNOSIS — E236 Other disorders of pituitary gland: Secondary | ICD-10-CM

## 2020-07-09 ENCOUNTER — Other Ambulatory Visit: Payer: Self-pay

## 2020-07-09 ENCOUNTER — Ambulatory Visit
Admission: RE | Admit: 2020-07-09 | Discharge: 2020-07-09 | Disposition: A | Discharge status: Home / Self Care | Payer: Medicaid Other | Source: Ambulatory Visit | Attending: Neurosurgery | Admitting: Neurosurgery

## 2020-07-09 DIAGNOSIS — E236 Other disorders of pituitary gland: Secondary | ICD-10-CM | POA: Diagnosis not present

## 2020-07-09 MED ORDER — GADOBUTROL 1 MMOL/ML IV SOLN
10.0000 mL | Freq: Once | INTRAVENOUS | Status: AC | PRN
  Administered 2020-07-09: 10 mL via INTRAVENOUS

## 2020-07-11 ENCOUNTER — Encounter (INDEPENDENT_AMBULATORY_CARE_PROVIDER_SITE_OTHER): Payer: Self-pay | Admitting: Vascular Surgery

## 2020-07-11 ENCOUNTER — Ambulatory Visit (INDEPENDENT_AMBULATORY_CARE_PROVIDER_SITE_OTHER): Payer: Medicaid Other | Admitting: Vascular Surgery

## 2020-07-11 ENCOUNTER — Other Ambulatory Visit: Payer: Self-pay

## 2020-07-11 VITALS — BP 120/81 | HR 82 | Ht 66.0 in | Wt 243.0 lb

## 2020-07-11 DIAGNOSIS — I872 Venous insufficiency (chronic) (peripheral): Secondary | ICD-10-CM

## 2020-07-11 DIAGNOSIS — I83813 Varicose veins of bilateral lower extremities with pain: Secondary | ICD-10-CM

## 2020-07-11 NOTE — Progress Notes (Signed)
Varicose veins of left lower extremity with inflammation (454.1  I83.10) Current Plans   Indication: Patient presents with symptomatic varicose veins of the left lower extremity.   Procedure: Sclerotherapy using hypertonic saline mixed with 1% Lidocaine was performed on the left lower extremity. Compression wraps were placed. The patient tolerated the procedure well. 

## 2020-07-24 ENCOUNTER — Other Ambulatory Visit: Payer: Self-pay

## 2020-07-24 ENCOUNTER — Encounter: Payer: Self-pay | Admitting: Family Medicine

## 2020-07-24 ENCOUNTER — Ambulatory Visit (INDEPENDENT_AMBULATORY_CARE_PROVIDER_SITE_OTHER): Payer: Medicaid Other | Admitting: Family Medicine

## 2020-07-24 VITALS — BP 100/64 | HR 74 | Temp 98.4°F | Ht 67.0 in | Wt 244.0 lb

## 2020-07-24 DIAGNOSIS — M25511 Pain in right shoulder: Secondary | ICD-10-CM

## 2020-07-24 DIAGNOSIS — G8929 Other chronic pain: Secondary | ICD-10-CM | POA: Diagnosis not present

## 2020-07-24 DIAGNOSIS — G43809 Other migraine, not intractable, without status migrainosus: Secondary | ICD-10-CM

## 2020-07-24 DIAGNOSIS — G4733 Obstructive sleep apnea (adult) (pediatric): Secondary | ICD-10-CM

## 2020-07-24 MED ORDER — IBUPROFEN 800 MG PO TABS
800.0000 mg | ORAL_TABLET | Freq: Three times a day (TID) | ORAL | 3 refills | Status: DC | PRN
Start: 2020-07-24 — End: 2021-10-13

## 2020-07-24 MED ORDER — SUMATRIPTAN SUCCINATE 50 MG PO TABS: ORAL_TABLET | ORAL | 3 refills | Status: DC

## 2020-07-24 NOTE — Progress Notes (Signed)
Established patient visit   Patient: Ann Morales   DOB: 07-23-1975   45 y.o. Female  MRN: [id number] Visit Date: 07/24/2020  Today's healthcare provider: Vernie Murders, PA-C   Chief Complaint  Patient presents with  . Arm Pain  . Sleep Apnea    Pt needs a referral for a new Cpap Machine.   . Migraine   Subjective    Arm Pain  Incident onset: Started about a month ago.  There was no injury mechanism. The pain is present in the right elbow, upper right arm and right shoulder. The quality of the pain is described as aching, burning, shooting, stabbing and cramping. The pain has been constant since the incident. Associated symptoms include muscle weakness, numbness (Especially in hand and fingers.) and tingling. She has tried ice and NSAIDs for the symptoms. The treatment provided no relief.  Migraine  This is a recurrent problem. The problem has been gradually worsening (Pt states she get a migraine 1-2 times a week. ). The pain quality is similar to prior headaches. The quality of the pain is described as throbbing, band-like and pulsating. Associated symptoms include numbness (Especially in hand and fingers.), tingling and weakness. Pertinent negatives include no back pain, coughing or neck pain. The treatment provided no relief (Pt states Topax is no longer helping with preventing her migraines. ).   HPI    Sleep Apnea     Additional comments: Pt needs a referral for a new Cpap Machine.        Last edited by Kizzie Furnish, CMA on 07/24/2020 11:54 AM. (History)        Patient Active Problem List   Diagnosis Date Noted  . Acute recurrent pansinusitis 03/08/2020  . Ear pain, right 03/08/2020  . History of migraine headaches 10/13/2019  . Menometrorrhagia 05/02/2019  . Mixed incontinence 05/02/2019  . Pelvic pain in female 12/21/2018  . Abdominal pain, right lower quadrant 12/21/2018  . Fusion of spine of cervical region 03/13/2017  . Cervical disc disorder with  radiculopathy 02/25/2017  . Lymphedema 04/24/2016  . Varicose veins of bilateral lower extremities with pain 04/08/2016  . Chronic venous insufficiency 04/08/2016  . OSA (obstructive sleep apnea) 04/04/2016  . Adult ADHD 01/18/2016  . Bipolar I disorder, current or most recent episode depressed, with psychotic features ([place]) 01/18/2016  . Other specified anxiety disorders 01/18/2016  . Allergic rhinitis 01/15/2015  . Current smoker 01/15/2015  . H/O manic depressive disorder 01/15/2015  . H/O renal calculi 01/15/2015  . Asthma 01/15/2015  . HLD (hyperlipidemia) 10/25/2013   Past Medical History:  Diagnosis Date  . ADHD (attention deficit hyperactivity disorder)   . Anxiety   . Arthritis   . Asthma    well controlled  . Bipolar disorder ([place])   . COVID-19 03/29/2020  . Depression   . GERD (gastroesophageal reflux disease)   . Headache    migraines  . History of kidney stones    currently as of 05-30-19  . Sleep apnea    cpap  . Wears dentures    partial upper   Family History  Problem Relation Age of Onset  . Hypercholesterolemia Father   . Bipolar disorder Mother   . Hypercholesterolemia Maternal Grandmother   . Hypertension Maternal Grandmother   . Anxiety disorder Maternal Grandmother   . Depression Maternal Grandmother   . Ovarian cancer Maternal Grandmother   . Colon cancer Maternal Grandfather 10  . Ovarian cancer Paternal Grandmother  Social History   Tobacco Use  . Smoking status: Current Every Day Smoker    Packs/day: 2.00    Years: 15.00    Pack years: 30.00    Types: Cigarettes    Start date: 09/25/1994  . Smokeless tobacco: Never Used  Vaping Use  . Vaping Use: Never used  Substance Use Topics  . Alcohol use: No    Alcohol/week: 0.0 standard drinks  . Drug use: No   Allergies  Allergen Reactions  . Codeine Hives and Shortness Of Breath  . Azithromycin Diarrhea  . Calamine-Zinc Oxide Itching  . Doxycycline Nausea And Vomiting  . Sulfa  Antibiotics Other (See Comments)    Severe abdominal pain.  . Zinc Other (See Comments)    Burning sensation     Medications: Outpatient Medications Prior to Visit  Medication Sig  . albuterol (VENTOLIN HFA) 108 (90 Base) MCG/ACT inhaler Inhale 2 puffs into the lungs every 4 (four) hours as needed for wheezing or shortness of breath.  . amphetamine-dextroamphetamine (ADDERALL) 15 MG tablet Take 15 mg by mouth daily at 2 PM.   . buPROPion (WELLBUTRIN XL) 150 MG 24 hr tablet Take 150 mg by mouth every morning.   . clonazePAM (KLONOPIN) 0.5 MG tablet Take 1 tablet (0.5 mg total) by mouth 2 (two) times daily as needed for anxiety. (Patient taking differently: Take 0.5 mg by mouth 2 (two) times daily.)  . DOVONEX 0.005 % cream Apply topically 2 (two) times daily.  . fluticasone (FLONASE) 50 MCG/ACT nasal spray Place 2 sprays into both nostrils daily.  [name] Kitchen HYDROcodone-acetaminophen (NORCO/VICODIN) 5-325 MG tablet Take 1-2 tablets by mouth every 6 (six) hours as needed for moderate pain.  [name] Kitchen INVEGA SUSTENNA 234 MG/1.5ML SUSY injection Inject into the muscle.  . ipratropium (ATROVENT) 0.03 % nasal spray Place 2 sprays into both nostrils 3 (three) times daily as needed for rhinitis.  [name] Kitchen lisdexamfetamine (VYVANSE) 40 MG capsule Take 40 mg by mouth in the morning.   . lithium 300 MG tablet Take 300 mg by mouth 2 (two) times daily.  [name] Kitchen loratadine (CLARITIN) 10 MG tablet One daily as needed for allergies (Patient taking differently: Take 10 mg by mouth daily as needed for allergies.)  . montelukast (SINGULAIR) 10 MG tablet Take 1 tablet (10 mg total) by mouth at bedtime.  [name] Kitchen omeprazole (PRILOSEC) 20 MG capsule TAKE 1 CAPSULE BY MOUTH EVERY DAY  . ondansetron (ZOFRAN ODT) 8 MG disintegrating tablet Take 1 tablet (8 mg total) by mouth every 8 (eight) hours as needed for nausea or vomiting.  . paliperidone (INVEGA) 6 MG 24 hr tablet Take 6 mg by mouth at bedtime.  . sodium chloride (OCEAN) 0.65 % SOLN nasal spray  Place 2 sprays into both nostrils as needed for congestion.  [name] Kitchen Spacer/Aero-Holding Chambers (AEROCHAMBER MV) inhaler Use as instructed  . SUMAtriptan (IMITREX) 50 MG tablet TAKE ONE TABLET AT ONSET OF HEADACHE MAY REPEAT IN 2 HOURS IF NOT BETTER  . topiramate (TOPAMAX) 50 MG tablet Take 1 tablet (50 mg total) by mouth at bedtime.  . traZODone (DESYREL) 100 MG tablet Take 150 mg by mouth at bedtime.  . triamcinolone (KENALOG) 0.025 % ointment Apply 1 application topically 2 (two) times daily.  . TRINTELLIX 10 MG TABS tablet Take 10 mg by mouth daily.  . cefdinir (OMNICEF) 300 MG capsule Take 1 capsule (300 mg total) by mouth 2 (two) times daily.  . chlorhexidine (PERIDEX) 0.12 % solution   . clobetasol cream (TEMOVATE) 0.05 %  Apply 1 application topically 2 (two) times daily. Up to 5 days per week  . LATUDA 40 MG TABS tablet Take 40 mg by mouth daily after supper. (Patient not taking: Reported on 07/24/2020)  . lidocaine (XYLOCAINE) 2 % solution SMARTSIG:By Mouth (Patient not taking: Reported on 07/24/2020)  . predniSONE (STERAPRED UNI-PAK 21 TAB) 10 MG (21) TBPK tablet Sterapred DS 6 day taper. Take as directed.   No facility-administered medications prior to visit.    Review of Systems  Constitutional: Negative.   Respiratory: Positive for apnea. Negative for cough, choking, chest tightness, shortness of breath, wheezing and stridor.   Cardiovascular: Negative.   Gastrointestinal: Negative.   Musculoskeletal: Positive for arthralgias and myalgias. Negative for back pain, gait problem, joint swelling, neck pain and neck stiffness.  Neurological: Positive for tingling, weakness and numbness (Especially in hand and fingers.).        Objective    BP 100/64 (BP Location: Left Arm, Patient Position: Sitting, Cuff Size: Large)   Pulse 74   Temp 98.4 F (36.9 C) (Oral)   Ht 5\' 7"  (1.702 m)   Wt 244 lb (110.7 kg)   SpO2 98%   BMI 38.22 kg/m    Physical Exam Constitutional:       General: She is not in acute distress.    Appearance: She is well-developed.  HENT:     Head: Normocephalic and atraumatic.     Right Ear: Hearing normal.     Left Ear: Hearing normal.     Nose: Nose normal.  Eyes:     General: Lids are normal. No scleral icterus.       Right eye: No discharge.        Left eye: No discharge.     Conjunctiva/sclera: Conjunctivae normal.  Cardiovascular:     Rate and Rhythm: Normal rate.     Heart sounds: Normal heart sounds.  Pulmonary:     Effort: Pulmonary effort is normal. No respiratory distress.     Breath sounds: Normal breath sounds.  Abdominal:     General: Bowel sounds are normal.     Palpations: Abdomen is soft.  Musculoskeletal:        General: Tenderness present.     Cervical back: Neck supple.     Comments: Right shoulder pain to reach overhead and stress the joint in a throwing motion.  Skin:    Findings: No lesion or rash.  Neurological:     Mental Status: She is alert and oriented to person, place, and time.  Psychiatric:        Speech: Speech normal.        Behavior: Behavior normal.        Thought Content: Thought content normal.     No results found for any visits on 07/24/20.  Assessment & Plan     1. Chronic right shoulder pain Suspected arthritis versus capsulitis. Will get x-ray evaluation and may use Ibuprofen 800 mg TID with moist heat applications.  - DG Shoulder Right; Future  2. Other migraine without status migrainosus, not intractable Given trial of Imitrex to use with Ibuprofen prn. Check labs for any metabolic disorder. - SUMAtriptan (IMITREX) 50 MG tablet; May repeat in 2 hours if headache persists or recurs.  Dispense: 10 tablet; Refill: 3 - ibuprofen (ADVIL) 800 MG tablet; Take 1 tablet (800 mg total) by mouth every 8 (eight) hours as needed.  Dispense: 30 tablet; Refill: 3 - CBC with Differential/Platelet - Comprehensive metabolic panel - TSH  3. OSA (obstructive sleep apnea) Diagnosed in 2018  with Epworth Sleepiness score of 18. States she needs a new CPAP machine. Will schedule for re-evaluation to adjust CPAP therapy or need for self-titration machine. Will place order for home sleep study through [facility] that provided her original machine. - CBC with Differential/Platelet - Comprehensive metabolic panel - TSH   No follow-ups on file.      I, Asriel Westrup, PA-C, have reviewed all documentation for this visit. The documentation on 08/15/20 for the exam, diagnosis, procedures, and orders are all accurate and complete.    Vernie Murders, PA-C  [employer] [phone] (phone) [phone] (fax)  [place]

## 2020-07-26 ENCOUNTER — Ambulatory Visit: Payer: Medicaid Other | Admitting: Family Medicine

## 2020-08-07 ENCOUNTER — Other Ambulatory Visit: Payer: Self-pay | Admitting: Dermatology

## 2020-08-07 DIAGNOSIS — L409 Psoriasis, unspecified: Secondary | ICD-10-CM

## 2020-08-08 ENCOUNTER — Ambulatory Visit (INDEPENDENT_AMBULATORY_CARE_PROVIDER_SITE_OTHER): Payer: Medicaid Other | Admitting: Vascular Surgery

## 2020-08-09 ENCOUNTER — Other Ambulatory Visit: Payer: Self-pay

## 2020-08-09 DIAGNOSIS — L409 Psoriasis, unspecified: Secondary | ICD-10-CM

## 2020-08-09 MED ORDER — DOVONEX 0.005 % EX CREA: TOPICAL_CREAM | Freq: Two times a day (BID) | CUTANEOUS | 0 refills | Status: DC

## 2020-08-09 NOTE — Progress Notes (Signed)
Patient requesting refill through pharmacy.

## 2020-08-15 ENCOUNTER — Telehealth: Payer: Self-pay

## 2020-08-15 ENCOUNTER — Encounter: Payer: Self-pay | Admitting: Family Medicine

## 2020-08-15 NOTE — Telephone Encounter (Signed)
Copied from [place] [id number]. Topic: General - Other >> Aug 15, 2020  2:46 PM Erick Blinks wrote: Reason for CRM: Pt called and is requesting to a call back regarding her CPAP machine/referral. She says that she has been awaiting follow up from [facility] and has yet to hear from the office  [phone]

## 2020-08-15 NOTE — Telephone Encounter (Signed)
Finally found she had her original machine through the [facility]. Will place order for home sleep test or she can contact them directly to let them see her machine that has given her problems. Still need her to get her blood tests and x-ray of her shoulder done. Those orders were placed 07-24-20.

## 2020-08-16 ENCOUNTER — Other Ambulatory Visit: Payer: Self-pay

## 2020-08-16 DIAGNOSIS — G4733 Obstructive sleep apnea (adult) (pediatric): Secondary | ICD-10-CM

## 2020-08-16 NOTE — Telephone Encounter (Signed)
Patient was advised looks like sleep study was ordered yesterday but not released, please sign off on order. KW

## 2020-08-20 ENCOUNTER — Other Ambulatory Visit: Payer: Self-pay

## 2020-08-20 ENCOUNTER — Ambulatory Visit
Admission: RE | Admit: 2020-08-20 | Discharge: 2020-08-20 | Disposition: A | Discharge status: Home / Self Care | Payer: Medicaid Other | Source: Ambulatory Visit | Attending: Family Medicine | Admitting: Family Medicine

## 2020-08-20 ENCOUNTER — Ambulatory Visit
Admission: RE | Admit: 2020-08-20 | Discharge: 2020-08-20 | Disposition: A | Discharge status: Home / Self Care | Payer: Medicaid Other | Attending: Family Medicine | Admitting: Family Medicine

## 2020-08-20 DIAGNOSIS — M25511 Pain in right shoulder: Secondary | ICD-10-CM | POA: Diagnosis present

## 2020-08-20 DIAGNOSIS — G8929 Other chronic pain: Secondary | ICD-10-CM | POA: Diagnosis present

## 2020-08-21 ENCOUNTER — Telehealth: Payer: Self-pay

## 2020-08-21 DIAGNOSIS — G8929 Other chronic pain: Secondary | ICD-10-CM

## 2020-08-21 NOTE — Telephone Encounter (Signed)
Copied from [place] [id number]. Topic: General - Other >> Aug 21, 2020  3:01 PM Alanda Slim E wrote: Reason for CRM: pt would like a nurse to call and go over xray results/ please advise

## 2020-08-21 NOTE — Addendum Note (Signed)
Addended by: Shawna Orleans on: 08/21/2020 06:36 PM   Modules accepted: Orders

## 2020-08-21 NOTE — Telephone Encounter (Signed)
Some thickening of proximal humerus, on x-ray, suspected to be from a tugging injury of the deltoid insertion. Suspect this occurs from repetitive lifting or pulling that puts stress on the deltoid muscle insertion on the midshaft of the humerus bone. If no relief from the Ibuprofen 800 mg TID use, will need to consider referral to an orthopedist.

## 2020-08-21 NOTE — Telephone Encounter (Signed)
Patient advised.

## 2020-08-23 ENCOUNTER — Encounter: Payer: Self-pay | Admitting: Neurology

## 2020-08-23 ENCOUNTER — Institutional Professional Consult (permissible substitution): Payer: Medicaid Other | Admitting: Neurology

## 2020-08-23 ENCOUNTER — Telehealth: Payer: Self-pay

## 2020-08-23 NOTE — Telephone Encounter (Signed)
Pt no showed 08/23/20 appt with Dr. Rexene Alberts

## 2020-09-05 ENCOUNTER — Encounter (INDEPENDENT_AMBULATORY_CARE_PROVIDER_SITE_OTHER): Payer: Self-pay

## 2020-09-05 ENCOUNTER — Ambulatory Visit (INDEPENDENT_AMBULATORY_CARE_PROVIDER_SITE_OTHER): Payer: Medicaid Other | Admitting: Vascular Surgery

## 2020-09-11 ENCOUNTER — Other Ambulatory Visit: Payer: Self-pay

## 2020-09-11 ENCOUNTER — Ambulatory Visit: Payer: Medicaid Other | Attending: Family Medicine | Admitting: Neurology

## 2020-09-11 DIAGNOSIS — Z79899 Other long term (current) drug therapy: Secondary | ICD-10-CM | POA: Insufficient documentation

## 2020-09-11 DIAGNOSIS — G4733 Obstructive sleep apnea (adult) (pediatric): Secondary | ICD-10-CM | POA: Diagnosis present

## 2020-09-11 DIAGNOSIS — Z791 Long term (current) use of non-steroidal anti-inflammatories (NSAID): Secondary | ICD-10-CM | POA: Diagnosis not present

## 2020-09-19 ENCOUNTER — Telehealth: Payer: Self-pay

## 2020-09-19 NOTE — Telephone Encounter (Signed)
Copied from [place] [phone] [phone]. Topic: General - Inquiry >> Sep 19, 2020  3:40 PM Alanda Slim E wrote: Reason for CRM: apt called to inquire about her sleep study results / she asked for a call from [facility] when results are complete/ please advise

## 2020-09-20 NOTE — Telephone Encounter (Signed)
No report in chart. Check with [place].

## 2020-09-26 NOTE — Procedures (Signed)
[place] A. Merlene Laughter, MD     [email or web address]              HOME SLEEP TEST  LOCATION: [facility]   Patient Name: Ann Morales, Ann Morales Date: 09/11/2020 Gender: Female D.O.B: 09/28/1975 Age (years): 39 Referring Provider: Phillips Odor MD, ABSM Height (inches): 33 Interpreting Physician: Phillips Odor MD, ABSM Weight (lbs): 244 RPSGT: Peak, Robert BMI: 38 MRN: [id number] Neck Size: CLINICAL INFORMATION Sleep Study Type: HST     Indication for sleep study: N/A     Epworth Sleepiness Score: N/A  SLEEP STUDY TECHNIQUE A multi-channel overnight portable sleep study was performed. The channels recorded were: nasal airflow, thoracic respiratory movement, and oxygen saturation with a pulse oximetry. Snoring was also monitored.  MEDICATIONS Patient self administered medications include: N/A.  Current Outpatient Medications:    albuterol (VENTOLIN HFA) 108 (90 Base) MCG/ACT inhaler, Inhale 2 puffs into the lungs every 4 (four) hours as needed for wheezing or shortness of breath., Disp: 18 g, Rfl: 0   amphetamine-dextroamphetamine (ADDERALL) 15 MG tablet, Take 15 mg by mouth daily at 2 PM. , Disp: , Rfl: 0   buPROPion (WELLBUTRIN XL) 150 MG 24 hr tablet, Take 150 mg by mouth every morning. , Disp: , Rfl: 1   cefdinir (OMNICEF) 300 MG capsule, Take 1 capsule (300 mg total) by mouth 2 (two) times daily., Disp: 20 capsule, Rfl: 0   chlorhexidine (PERIDEX) 0.12 % solution, , Disp: , Rfl:    clobetasol cream (TEMOVATE) 5.63 %, APPLY 1 APPLICATION TOPICALLY 2 (TWO) TIMES DAILY. UP TO 5 DAYS PER WEEK, Disp: 30 g, Rfl: 0   clonazePAM (KLONOPIN) 0.5 MG tablet, Take 1 tablet (0.5 mg total) by mouth 2 (two) times daily as needed for anxiety. (Patient taking differently: Take 0.5 mg by mouth 2 (two) times daily.), Disp: 60 tablet, Rfl: 3   DOVONEX 0.005 % cream, Apply topically 2 (two) times daily., Disp: 60 g, Rfl: 0   fluticasone (FLONASE) 50 MCG/ACT nasal spray,  Place 2 sprays into both nostrils daily., Disp: 16 g, Rfl: 6   HYDROcodone-acetaminophen (NORCO/VICODIN) 5-325 MG tablet, Take 1-2 tablets by mouth every 6 (six) hours as needed for moderate pain., Disp: 40 tablet, Rfl: 0   ibuprofen (ADVIL) 800 MG tablet, Take 1 tablet (800 mg total) by mouth every 8 (eight) hours as needed., Disp: 30 tablet, Rfl: 3   INVEGA SUSTENNA 234 MG/1.5ML SUSY injection, Inject into the muscle., Disp: , Rfl:    ipratropium (ATROVENT) 0.03 % nasal spray, Place 2 sprays into both nostrils 3 (three) times daily as needed for rhinitis., Disp: 30 mL, Rfl: 0   LATUDA 40 MG TABS tablet, Take 40 mg by mouth daily after supper. (Patient not taking: Reported on 07/24/2020), Disp: , Rfl: 2   lidocaine (XYLOCAINE) 2 % solution, SMARTSIG:By Mouth (Patient not taking: Reported on 07/24/2020), Disp: , Rfl:    lisdexamfetamine (VYVANSE) 40 MG capsule, Take 40 mg by mouth in the morning. , Disp: , Rfl:    lithium 300 MG tablet, Take 300 mg by mouth 2 (two) times daily., Disp: , Rfl: 2   loratadine (CLARITIN) 10 MG tablet, One daily as needed for allergies (Patient taking differently: Take 10 mg by mouth daily as needed for allergies.), Disp: 30 tablet, Rfl: 5   montelukast (SINGULAIR) 10 MG tablet, Take 1 tablet (10 mg total) by mouth at bedtime., Disp: 90 tablet, Rfl: 1   omeprazole (PRILOSEC) 20 MG capsule, TAKE 1 CAPSULE BY MOUTH  EVERY DAY, Disp: 30 capsule, Rfl: 5   ondansetron (ZOFRAN ODT) 8 MG disintegrating tablet, Take 1 tablet (8 mg total) by mouth every 8 (eight) hours as needed for nausea or vomiting., Disp: 20 tablet, Rfl: 0   paliperidone (INVEGA) 6 MG 24 hr tablet, Take 6 mg by mouth at bedtime., Disp: , Rfl:    predniSONE (STERAPRED UNI-PAK 21 TAB) 10 MG (21) TBPK tablet, Sterapred DS 6 day taper. Take as directed., Disp: 21 tablet, Rfl: 0   sodium chloride (OCEAN) 0.65 % SOLN nasal spray, Place 2 sprays into both nostrils as needed for congestion., Disp: 104 mL, Rfl: 0    Spacer/Aero-Holding Chambers (AEROCHAMBER MV) inhaler, Use as instructed, Disp: 1 each, Rfl: 2   SUMAtriptan (IMITREX) 50 MG tablet, May repeat in 2 hours if headache persists or recurs., Disp: 10 tablet, Rfl: 3   topiramate (TOPAMAX) 50 MG tablet, Take 1 tablet (50 mg total) by mouth at bedtime., Disp: 90 tablet, Rfl: 0   traZODone (DESYREL) 100 MG tablet, Take 150 mg by mouth at bedtime., Disp: , Rfl:    triamcinolone (KENALOG) 0.025 % ointment, Apply 1 application topically 2 (two) times daily., Disp: 30 g, Rfl: 0   TRINTELLIX 10 MG TABS tablet, Take 10 mg by mouth daily., Disp: , Rfl:    SLEEP ARCHITECTURE Patient was studied for 363.9 minutes. The sleep efficiency was 75.8 % and the patient was supine for 41.2%. The arousal index was 0.0 per hour.  RESPIRATORY PARAMETERS The overall AHI was 48.8 per hour, with a central apnea index of 0.7 per hour.  The oxygen nadir was 73% during sleep.     CARDIAC DATA Mean heart rate during sleep was 58.9 bpm.  IMPRESSIONS Severe obstructive sleep apnea occurred during this study (AHI = 48.8/h).  Auto PAP 8-20 is recommended.  Delano Metz, MD Diplomate, American Board of Sleep Medicine.  ELECTRONICALLY SIGNED ON:  09/26/2020, 9:33 AM [place] PH: [phone]   FX: [phone] [place]

## 2020-10-01 ENCOUNTER — Telehealth: Payer: Self-pay

## 2020-10-01 NOTE — Telephone Encounter (Signed)
Copied from [place] [phone]. Topic: General - Other >> Oct 01, 2020  1:42 PM Celene Kras wrote: Reason for CRM: Pt called stating that she is needing to have the results of her sleep study discussed and is requesting to have a nurse give her a call back. Please advise.

## 2020-10-03 ENCOUNTER — Ambulatory Visit (INDEPENDENT_AMBULATORY_CARE_PROVIDER_SITE_OTHER): Payer: Medicaid Other | Admitting: Vascular Surgery

## 2020-10-11 ENCOUNTER — Telehealth: Payer: Self-pay

## 2020-10-11 NOTE — Telephone Encounter (Signed)
Ann Morales Patient is calling again for the results of her sleep study.  If you look in media on 09/11/20 the results were scanned into the chart but not signed off.  Please sign off and route message to your nurse box Thanks

## 2020-10-11 NOTE — Telephone Encounter (Signed)
Sleep study in encounter dated 09-11-20 shows severe obstructive sleep apnea. Need to order CPAP with hydration and auto-titration to 8-20 cm H2O.

## 2020-10-11 NOTE — Progress Notes (Signed)
Sleep study shows severe obstructive sleep study. Need to order CPAP with hydration and auto-titration to 8-20 cm H2O.

## 2020-10-15 NOTE — Telephone Encounter (Signed)
Patient was advised of this on 10/12/2020. Will send order for CPAP.

## 2020-10-22 ENCOUNTER — Institutional Professional Consult (permissible substitution): Payer: Medicaid Other | Admitting: Neurology

## 2020-10-25 ENCOUNTER — Other Ambulatory Visit (INDEPENDENT_AMBULATORY_CARE_PROVIDER_SITE_OTHER): Payer: Self-pay | Admitting: Nurse Practitioner

## 2020-11-02 ENCOUNTER — Telehealth: Payer: Self-pay | Admitting: *Deleted

## 2020-11-02 NOTE — Telephone Encounter (Signed)
Sleep study shows severe obstructive sleep apnea.  Need to order CPAP with hydration and auto-titration to 8-20 cm H2O.

## 2020-11-02 NOTE — Telephone Encounter (Signed)
Home sleep test was done on 09/11/2020. Report has been scanned into chart. Have you reviewed the report and sent a message with the results and recommendations?

## 2020-11-02 NOTE — Telephone Encounter (Signed)
Copied from [place] [phone] [phone]. Topic: General - Other >> Nov 02, 2020 11:09 AM Ann Morales A wrote: Reason for CRM: Patient has called with questions about the orders for their CPAP machine  The patient shares that they have heard no further communication on the order and would like to know where it was sent to   The patient would like to discuss this further when possible

## 2020-11-05 NOTE — Telephone Encounter (Signed)
Orders completed and sent to provider for signature.  Then will be forwarded to MR for faxing

## 2020-11-06 NOTE — Telephone Encounter (Signed)
Order received for faxing. Spoke with pt. Pt advised that she would like her CPAP supply order sent to [facility]. I gave [facility] contact info to pt. Faxing order today to [facility] FAX# [phone]. Phone# [phone].

## 2020-11-09 ENCOUNTER — Telehealth: Payer: Self-pay | Admitting: Neurology

## 2020-11-09 NOTE — Telephone Encounter (Signed)
[name]) called, we received clinical notes, but no order was attached. Would like a call from the nurse.

## 2020-11-13 NOTE — Telephone Encounter (Signed)
We have never seen this patient. She no-showed a new patient appt on 08/23/20 with Dr Rexene Alberts and then canceled her 10/22/20 appt stating sleep study was already done.   I called Tanya back at [employer] and LVM advising we have never seen the patient before. We were going to but she did not show. Advised Lavella Lemons would need to contact another provider for what they need. Left office number in case she needs to call us back but again advised we have never seen the patient.

## 2020-11-14 ENCOUNTER — Other Ambulatory Visit: Payer: Self-pay | Admitting: Orthopedic Surgery

## 2020-11-14 ENCOUNTER — Other Ambulatory Visit (HOSPITAL_COMMUNITY): Payer: Self-pay | Admitting: Orthopedic Surgery

## 2020-11-14 DIAGNOSIS — M4802 Spinal stenosis, cervical region: Secondary | ICD-10-CM

## 2020-11-14 DIAGNOSIS — M4712 Other spondylosis with myelopathy, cervical region: Secondary | ICD-10-CM

## 2020-11-21 ENCOUNTER — Other Ambulatory Visit: Payer: Self-pay

## 2020-11-21 ENCOUNTER — Ambulatory Visit
Admission: RE | Admit: 2020-11-21 | Discharge: 2020-11-21 | Disposition: A | Discharge status: Home / Self Care | Payer: Medicaid Other | Source: Ambulatory Visit | Attending: Orthopedic Surgery | Admitting: Orthopedic Surgery

## 2020-11-21 DIAGNOSIS — M4802 Spinal stenosis, cervical region: Secondary | ICD-10-CM | POA: Insufficient documentation

## 2020-11-21 DIAGNOSIS — M4712 Other spondylosis with myelopathy, cervical region: Secondary | ICD-10-CM | POA: Diagnosis present

## 2021-02-25 ENCOUNTER — Ambulatory Visit: Payer: Medicaid Other | Admitting: Family Medicine

## 2021-02-26 ENCOUNTER — Encounter: Payer: Self-pay | Admitting: Physician Assistant

## 2021-02-26 ENCOUNTER — Other Ambulatory Visit: Payer: Self-pay

## 2021-02-26 ENCOUNTER — Ambulatory Visit (INDEPENDENT_AMBULATORY_CARE_PROVIDER_SITE_OTHER): Payer: Medicaid Other | Admitting: Physician Assistant

## 2021-02-26 VITALS — BP 104/81 | HR 84 | Temp 98.2°F | Ht 67.0 in | Wt 213.5 lb

## 2021-02-26 DIAGNOSIS — R42 Dizziness and giddiness: Secondary | ICD-10-CM

## 2021-02-26 DIAGNOSIS — D352 Benign neoplasm of pituitary gland: Secondary | ICD-10-CM | POA: Diagnosis not present

## 2021-02-26 DIAGNOSIS — R0981 Nasal congestion: Secondary | ICD-10-CM

## 2021-02-26 MED ORDER — MECLIZINE HCL 25 MG PO TABS: 25.0000 mg | ORAL_TABLET | Freq: Three times a day (TID) | ORAL | 0 refills | Status: DC | PRN

## 2021-02-26 MED ORDER — AZELASTINE HCL 0.1 % NA SOLN: 2.0000 | Freq: Two times a day (BID) | NASAL | 12 refills | Status: DC

## 2021-02-26 NOTE — Progress Notes (Signed)
Established patient visit   Patient: Ann Morales   DOB: 1976-01-18   45 y.o. Female  MRN: [id number] Visit Date: 02/26/2021  Today's healthcare provider: Mikey Kirschner, PA-C   Chief Complaint  Patient presents with   Dizziness   Subjective    HPI   Ann Morales is a 45 y/o female with a history of a pituitary macroadenoma who presents today with 'dizzy spells' that have been occurring on and off for the past month. She reports she feels them more when she moves quickly--turning back, sitting to standing, bending over. These spells are sometimes relieved when she lies down. Describes dizziness as 'world spinning around her'. Sometime she will feel nauseous when she is dizzy. Denies new headaches-- pt has chronic migraines, denies chest pain, palpitations,SOB, syncope. Reports not drinking a lot of water, dislikes the taste. Denies any changes in any of her medications, taking as prescribed.   Medications: Outpatient Medications Prior to Visit  Medication Sig   albuterol (VENTOLIN HFA) 108 (90 Base) MCG/ACT inhaler Inhale 2 puffs into the lungs every 4 (four) hours as needed for wheezing or shortness of breath.   amphetamine-dextroamphetamine (ADDERALL) 15 MG tablet Take 15 mg by mouth daily at 2 PM.    buPROPion (WELLBUTRIN XL) 150 MG 24 hr tablet Take 150 mg by mouth every morning.    cefdinir (OMNICEF) 300 MG capsule Take 1 capsule (300 mg total) by mouth 2 (two) times daily.   chlorhexidine (PERIDEX) 0.12 % solution    clobetasol cream (TEMOVATE) 1.75 % APPLY 1 APPLICATION TOPICALLY 2 (TWO) TIMES DAILY. UP TO 5 DAYS PER WEEK   clonazePAM (KLONOPIN) 0.5 MG tablet Take 1 tablet (0.5 mg total) by mouth 2 (two) times daily as needed for anxiety. (Patient taking differently: Take 0.5 mg by mouth 2 (two) times daily.)   DOVONEX 0.005 % cream Apply topically 2 (two) times daily.   fluticasone (FLONASE) 50 MCG/ACT nasal spray Place 2 sprays into both nostrils daily.    HYDROcodone-acetaminophen (NORCO/VICODIN) 5-325 MG tablet Take 1-2 tablets by mouth every 6 (six) hours as needed for moderate pain.   ibuprofen (ADVIL) 800 MG tablet Take 1 tablet (800 mg total) by mouth every 8 (eight) hours as needed.   INVEGA SUSTENNA 234 MG/1.5ML SUSY injection Inject into the muscle.   ipratropium (ATROVENT) 0.03 % nasal spray Place 2 sprays into both nostrils 3 (three) times daily as needed for rhinitis.   LATUDA 40 MG TABS tablet Take 40 mg by mouth daily after supper.   lidocaine (XYLOCAINE) 2 % solution    lisdexamfetamine (VYVANSE) 40 MG capsule Take 40 mg by mouth in the morning.    lithium 300 MG tablet Take 300 mg by mouth 2 (two) times daily.   loratadine (CLARITIN) 10 MG tablet One daily as needed for allergies (Patient taking differently: Take 10 mg by mouth daily as needed for allergies.)   montelukast (SINGULAIR) 10 MG tablet Take 1 tablet (10 mg total) by mouth at bedtime.   omeprazole (PRILOSEC) 20 MG capsule TAKE 1 CAPSULE BY MOUTH EVERY DAY   ondansetron (ZOFRAN ODT) 8 MG disintegrating tablet Take 1 tablet (8 mg total) by mouth every 8 (eight) hours as needed for nausea or vomiting.   paliperidone (INVEGA) 6 MG 24 hr tablet Take 6 mg by mouth at bedtime.   predniSONE (STERAPRED UNI-PAK 21 TAB) 10 MG (21) TBPK tablet Sterapred DS 6 day taper. Take as directed.   sodium chloride (OCEAN) 0.65 %  SOLN nasal spray Place 2 sprays into both nostrils as needed for congestion.   Spacer/Aero-Holding Chambers (AEROCHAMBER MV) inhaler Use as instructed   SUMAtriptan (IMITREX) 50 MG tablet May repeat in 2 hours if headache persists or recurs.   topiramate (TOPAMAX) 50 MG tablet Take 1 tablet (50 mg total) by mouth at bedtime.   traZODone (DESYREL) 100 MG tablet Take 150 mg by mouth at bedtime.   triamcinolone (KENALOG) 0.025 % ointment Apply 1 application topically 2 (two) times daily.   TRINTELLIX 10 MG TABS tablet Take 10 mg by mouth daily.   No  facility-administered medications prior to visit.    Review of Systems  Constitutional:  Negative for fatigue and fever.  HENT:  Positive for sinus pressure.   Respiratory:  Negative for cough and shortness of breath.   Cardiovascular:  Negative for chest pain.  Gastrointestinal:  Positive for nausea.  Neurological:  Positive for dizziness and light-headedness.     Objective    Blood pressure 104/81, pulse 84, temperature 98.2 F (36.8 C), temperature source Oral, height 5\' 7"  (1.702 m), weight 213 lb 8 oz (96.8 kg), SpO2 98 %.   Physical Exam Constitutional:      General: She is awake.     Appearance: She is well-developed.  HENT:     Head: Normocephalic.  Eyes:     Extraocular Movements: Extraocular movements intact.     Right eye: Nystagmus present.     Conjunctiva/sclera: Conjunctivae normal.     Pupils: Pupils are equal, round, and reactive to light.     Comments: Very slight nystagmus when testing EOM and looking right.   Cardiovascular:     Rate and Rhythm: Normal rate and regular rhythm.     Heart sounds: Normal heart sounds.  Pulmonary:     Effort: Pulmonary effort is normal.     Breath sounds: Normal breath sounds.  Skin:    General: Skin is warm.  Neurological:     Mental Status: She is alert and oriented to person, place, and time.     Comments: Ann Morales Round maneuver was performed + vertigo sxs R>L, no nystagmus visualized.   Psychiatric:        Attention and Perception: Attention normal.        Mood and Affect: Mood normal.        Speech: Speech normal.        Behavior: Behavior is cooperative.    No results found for any visits on 02/26/21.  Assessment & Plan     Vertigo , BPPV vs pit. Macroadenoma Symptoms do seem positional in nature. Pt instructed to try Epley maneuver, we attempted together in office. Meclizine rx, up to TID. Pt has seem [facility] neuro but unable to keep with f/u due to travel difficulties and financial limitations. Ref to  neurosurgery locally to eval for dizziness and removal of pit. Tumor Pt instructed to go to ED if syncope, vomiting.  Return if symptoms worsen or fail to improve.      I, Mikey Kirschner, PA-C have reviewed all documentation for this visit. The documentation on 02/26/2021  for the exam, diagnosis, procedures, and orders are all accurate and complete.    Mikey Kirschner, PA-C  [facility] [phone] (phone) [phone] (fax)  [place]

## 2021-07-15 IMAGING — CR DG ANKLE COMPLETE 3+V*R*
3 series · 3 of 3 positions shown · non-contrast
Comparison: None.

CLINICAL DATA: Soft tissue injury to the right ankle 1 week ago.
Increased pain. Also erythema, warmth and swelling.

EXAM:
RIGHT ANKLE - COMPLETE 3+ VIEW

[ankle ap]
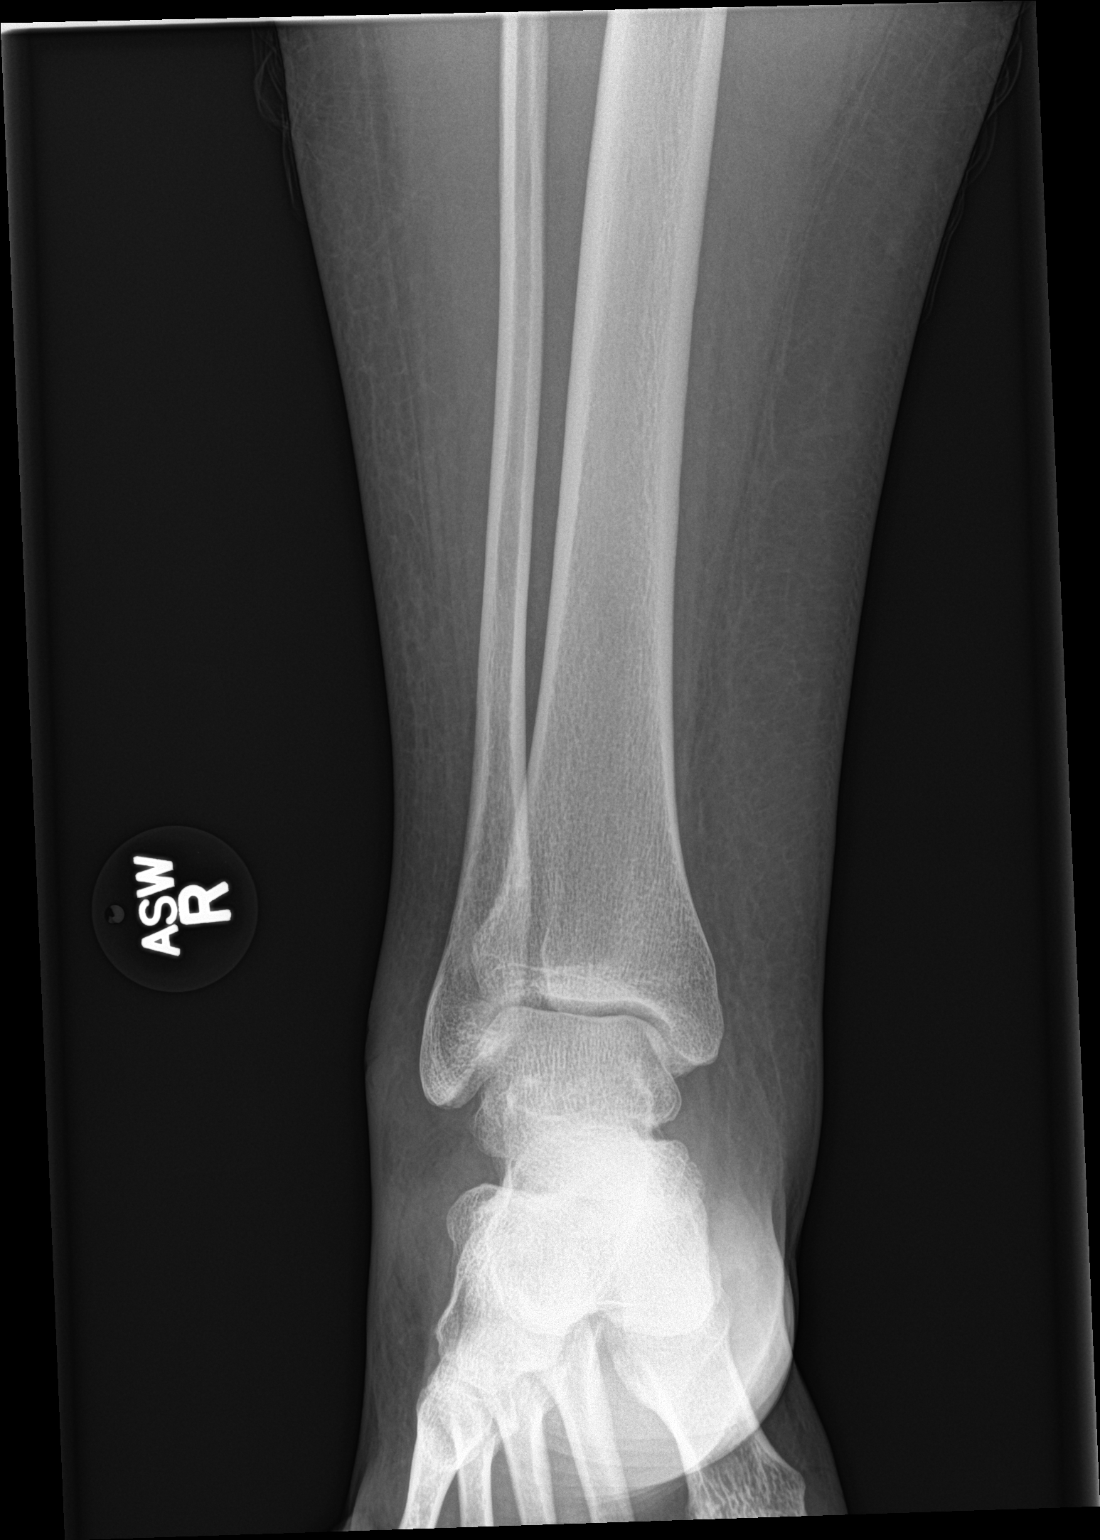

[ankle obl]
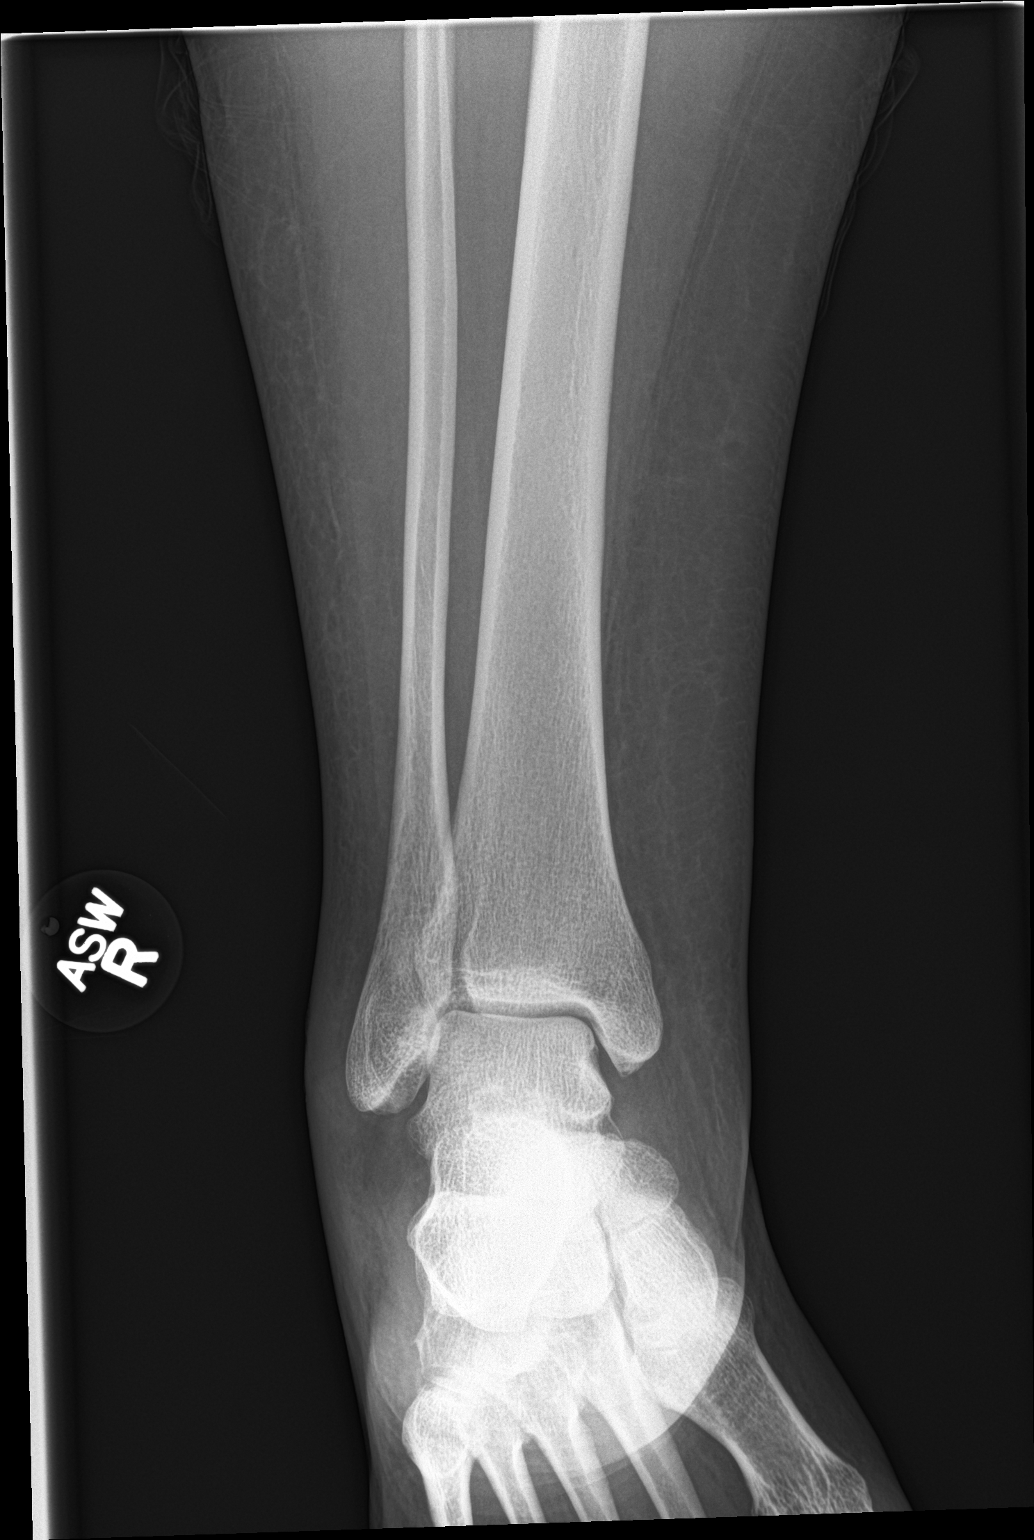

[ankle lat]
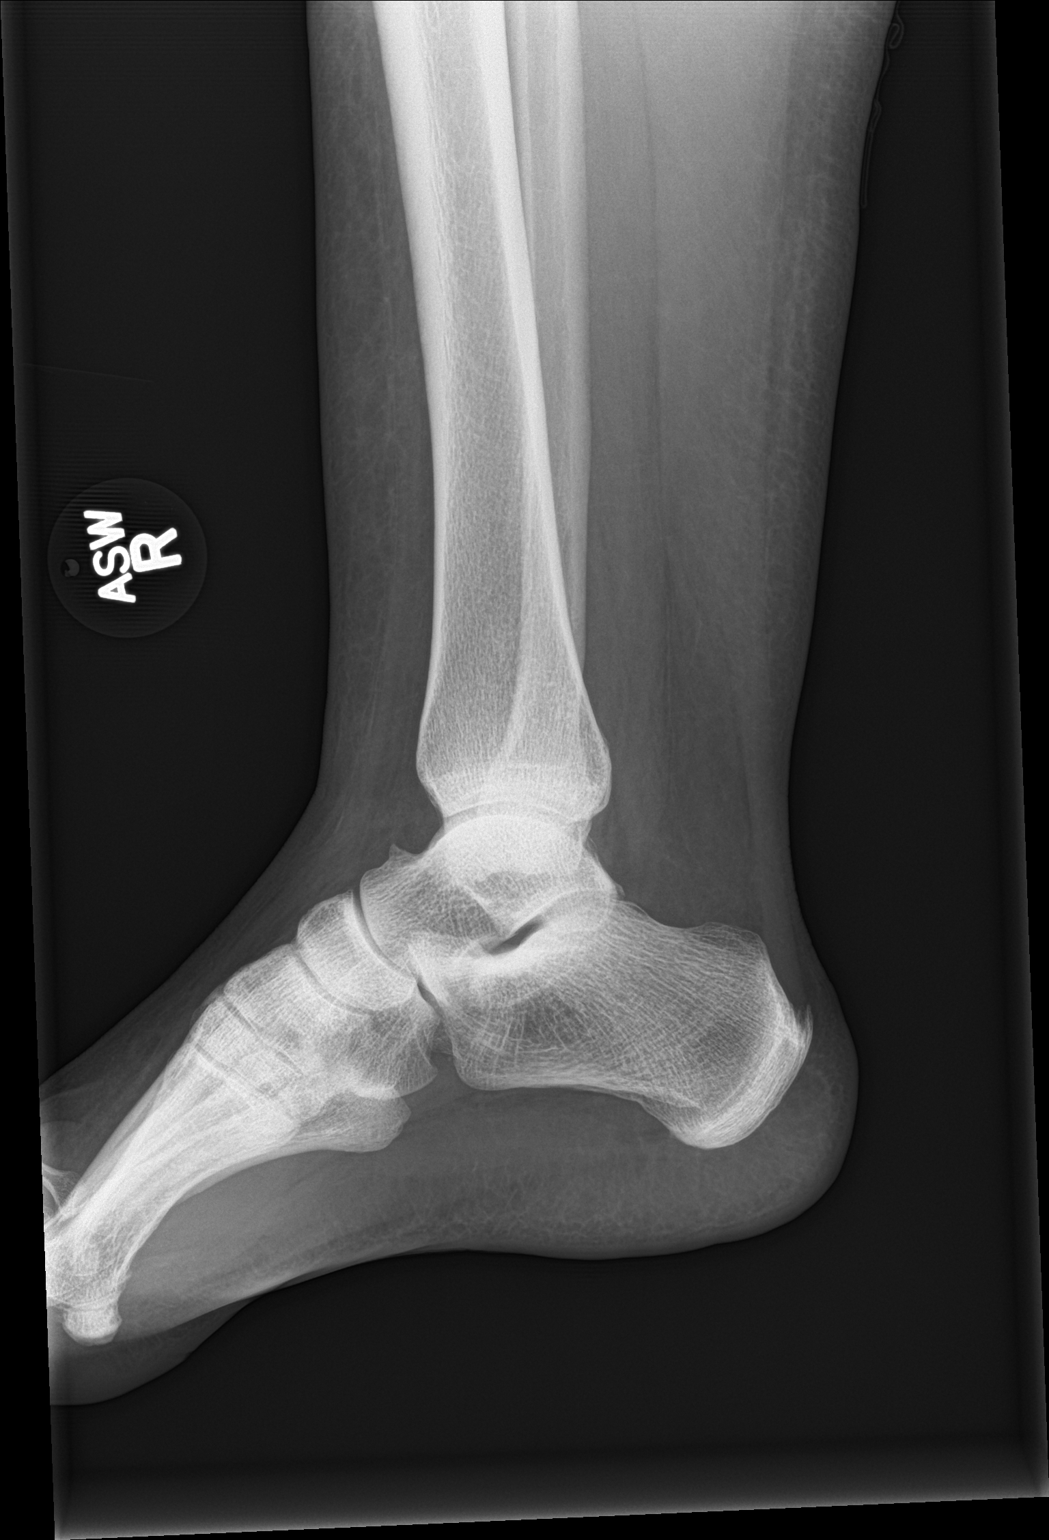

[3 of 3 positions shown; findings below may reference images not displayed]

FINDINGS: No fracture or bone lesion.

Ankle joint normally spaced and aligned.

No bone resorption to suggest osteomyelitis.

There is diffuse subcutaneous soft tissue edema. No focal soft
tissue abnormality is seen to suggest a mass or abscess. No soft
tissue air.
IMPRESSION: 1. No fracture, bone lesion or ankle joint abnormality.
2. No soft tissue air.

## 2021-10-08 ENCOUNTER — Inpatient Hospital Stay
Admission: EM | Admit: 2021-10-08 | Discharge: 2021-10-17 | DRG: 312 | Disposition: A | Discharge status: Home / Self Care | Payer: Medicaid Other | Attending: Internal Medicine | Admitting: Internal Medicine

## 2021-10-08 ENCOUNTER — Emergency Department: Payer: Medicaid Other

## 2021-10-08 ENCOUNTER — Other Ambulatory Visit: Payer: Self-pay

## 2021-10-08 ENCOUNTER — Observation Stay: Payer: Medicaid Other

## 2021-10-08 ENCOUNTER — Encounter: Payer: Self-pay | Admitting: Emergency Medicine

## 2021-10-08 DIAGNOSIS — D352 Benign neoplasm of pituitary gland: Secondary | ICD-10-CM

## 2021-10-08 DIAGNOSIS — Z79899 Other long term (current) drug therapy: Secondary | ICD-10-CM

## 2021-10-08 DIAGNOSIS — Z888 Allergy status to other drugs, medicaments and biological substances status: Secondary | ICD-10-CM

## 2021-10-08 DIAGNOSIS — H5509 Other forms of nystagmus: Secondary | ICD-10-CM | POA: Diagnosis present

## 2021-10-08 DIAGNOSIS — G4733 Obstructive sleep apnea (adult) (pediatric): Secondary | ICD-10-CM | POA: Diagnosis present

## 2021-10-08 DIAGNOSIS — R42 Dizziness and giddiness: Principal | ICD-10-CM

## 2021-10-08 DIAGNOSIS — F319 Bipolar disorder, unspecified: Secondary | ICD-10-CM | POA: Diagnosis not present

## 2021-10-08 DIAGNOSIS — J4520 Mild intermittent asthma, uncomplicated: Secondary | ICD-10-CM

## 2021-10-08 DIAGNOSIS — F17210 Nicotine dependence, cigarettes, uncomplicated: Secondary | ICD-10-CM | POA: Diagnosis present

## 2021-10-08 DIAGNOSIS — E876 Hypokalemia: Secondary | ICD-10-CM | POA: Diagnosis present

## 2021-10-08 DIAGNOSIS — G43909 Migraine, unspecified, not intractable, without status migrainosus: Secondary | ICD-10-CM | POA: Diagnosis present

## 2021-10-08 DIAGNOSIS — Z981 Arthrodesis status: Secondary | ICD-10-CM

## 2021-10-08 DIAGNOSIS — R001 Bradycardia, unspecified: Secondary | ICD-10-CM | POA: Diagnosis present

## 2021-10-08 DIAGNOSIS — Z882 Allergy status to sulfonamides status: Secondary | ICD-10-CM

## 2021-10-08 DIAGNOSIS — Z8616 Personal history of COVID-19: Secondary | ICD-10-CM

## 2021-10-08 DIAGNOSIS — I959 Hypotension, unspecified: Secondary | ICD-10-CM | POA: Diagnosis not present

## 2021-10-08 DIAGNOSIS — Z818 Family history of other mental and behavioral disorders: Secondary | ICD-10-CM

## 2021-10-08 DIAGNOSIS — K219 Gastro-esophageal reflux disease without esophagitis: Secondary | ICD-10-CM | POA: Diagnosis present

## 2021-10-08 DIAGNOSIS — E785 Hyperlipidemia, unspecified: Secondary | ICD-10-CM | POA: Diagnosis present

## 2021-10-08 DIAGNOSIS — I951 Orthostatic hypotension: Principal | ICD-10-CM | POA: Diagnosis present

## 2021-10-08 DIAGNOSIS — Z83438 Family history of other disorder of lipoprotein metabolism and other lipidemia: Secondary | ICD-10-CM

## 2021-10-08 DIAGNOSIS — Z881 Allergy status to other antibiotic agents status: Secondary | ICD-10-CM

## 2021-10-08 DIAGNOSIS — J45909 Unspecified asthma, uncomplicated: Secondary | ICD-10-CM

## 2021-10-08 DIAGNOSIS — Z885 Allergy status to narcotic agent status: Secondary | ICD-10-CM

## 2021-10-08 LAB — LITHIUM LEVEL: Lithium Lvl: 0.25 mmol/L — ABNORMAL LOW (ref 0.60–1.20)

## 2021-10-08 LAB — BASIC METABOLIC PANEL
Anion gap: 6 (ref 5–15)
BUN: 13 mg/dL (ref 6–20)
CO2: 29 mmol/L (ref 22–32)
Calcium: 9.3 mg/dL (ref 8.9–10.3)
Chloride: 104 mmol/L (ref 98–111)
Creatinine, Ser: 1.07 mg/dL — ABNORMAL HIGH (ref 0.44–1.00)
GFR, Estimated: >60 mL/min (ref >60)
Glucose, Bld: 94 mg/dL (ref 70–99)
Potassium: 3.8 mmol/L (ref 3.5–5.1)
Sodium: 139 mmol/L (ref 135–145)

## 2021-10-08 LAB — CBC WITH DIFFERENTIAL/PLATELET
Abs Immature Granulocytes: 0.02 10*3/uL (ref 0.00–0.07)
Basophils Absolute: 0.1 10*3/uL (ref 0.0–0.1)
Basophils Relative: 1 %
Eosinophils Absolute: 0.1 10*3/uL (ref 0.0–0.5)
Eosinophils Relative: 1 %
HCT: 46.1 % — ABNORMAL HIGH (ref 36.0–46.0)
Hemoglobin: 14.9 g/dL (ref 12.0–15.0)
Immature Granulocytes: 0 %
Lymphocytes Relative: 27 %
Lymphs Abs: 2.6 10*3/uL (ref 0.7–4.0)
MCH: 27.9 pg (ref 26.0–34.0)
MCHC: 32.3 g/dL (ref 30.0–36.0)
MCV: 86.2 fL (ref 80.0–100.0)
Monocytes Absolute: 0.4 10*3/uL (ref 0.1–1.0)
Monocytes Relative: 4 %
Neutro Abs: 6.4 10*3/uL (ref 1.7–7.7)
Neutrophils Relative %: 67 %
Platelets: 217 10*3/uL (ref 150–400)
RBC: 5.35 MIL/uL — ABNORMAL HIGH (ref 3.87–5.11)
RDW: 12.6 % (ref 11.5–15.5)
WBC: 9.5 10*3/uL (ref 4.0–10.5)
nRBC: 0 % (ref 0.0–0.2)

## 2021-10-08 LAB — TROPONIN I (HIGH SENSITIVITY)
Troponin I (High Sensitivity): 6 ng/L (ref <18)
Troponin I (High Sensitivity): 7 ng/L (ref <18)

## 2021-10-08 LAB — TSH: TSH: 1.767 u[IU]/mL (ref 0.350–4.500)

## 2021-10-08 LAB — T4, FREE: Free T4: 0.8 ng/dL (ref 0.61–1.12)

## 2021-10-08 MED ORDER — TRAZODONE HCL 100 MG PO TABS
100.0000 mg | ORAL_TABLET | Freq: Every day | ORAL | Status: DC
  Administered 2021-10-08: 100 mg via ORAL
  Filled 2021-10-08: qty 1

## 2021-10-08 MED ORDER — SODIUM CHLORIDE 0.9 % IV SOLN: INTRAVENOUS | Status: DC

## 2021-10-08 MED ORDER — ONDANSETRON HCL 4 MG/2ML IJ SOLN: 4.0000 mg | Freq: Four times a day (QID) | INTRAMUSCULAR | Status: DC | PRN

## 2021-10-08 MED ORDER — MAGNESIUM HYDROXIDE 400 MG/5ML PO SUSP: 30.0000 mL | Freq: Every day | ORAL | Status: DC | PRN

## 2021-10-08 MED ORDER — ACETAMINOPHEN 650 MG RE SUPP: 650.0000 mg | Freq: Four times a day (QID) | RECTAL | Status: DC | PRN

## 2021-10-08 MED ORDER — SODIUM CHLORIDE 0.9 % IV BOLUS
1000.0000 mL | Freq: Once | INTRAVENOUS | Status: AC
  Administered 2021-10-08: 1000 mL via INTRAVENOUS

## 2021-10-08 MED ORDER — SUMATRIPTAN SUCCINATE 50 MG PO TABS
50.0000 mg | ORAL_TABLET | ORAL | Status: DC | PRN
  Administered 2021-10-10 – 2021-10-17 (×4): 50 mg via ORAL
  Filled 2021-10-08 (×5): qty 1

## 2021-10-08 MED ORDER — MECLIZINE HCL 25 MG PO TABS
50.0000 mg | ORAL_TABLET | Freq: Once | ORAL | Status: AC
  Administered 2021-10-08: 50 mg via ORAL
  Filled 2021-10-08: qty 2

## 2021-10-08 MED ORDER — CLONAZEPAM 0.5 MG PO TABS: 0.5000 mg | ORAL_TABLET | Freq: Two times a day (BID) | ORAL | Status: DC | PRN

## 2021-10-08 MED ORDER — IPRATROPIUM-ALBUTEROL 0.5-2.5 (3) MG/3ML IN SOLN: 3.0000 mL | Freq: Four times a day (QID) | RESPIRATORY_TRACT | Status: DC | PRN

## 2021-10-08 MED ORDER — ACETAMINOPHEN 325 MG PO TABS
650.0000 mg | ORAL_TABLET | Freq: Four times a day (QID) | ORAL | Status: DC | PRN
  Administered 2021-10-10 – 2021-10-11 (×2): 650 mg via ORAL
  Filled 2021-10-08 (×3): qty 2

## 2021-10-08 MED ORDER — MONTELUKAST SODIUM 10 MG PO TABS
10.0000 mg | ORAL_TABLET | Freq: Every day | ORAL | Status: DC
  Administered 2021-10-08 – 2021-10-16 (×9): 10 mg via ORAL
  Filled 2021-10-08 (×9): qty 1

## 2021-10-08 MED ORDER — SODIUM CHLORIDE 0.9 % IV BOLUS
500.0000 mL | Freq: Once | INTRAVENOUS | Status: AC
  Administered 2021-10-08: 500 mL via INTRAVENOUS

## 2021-10-08 MED ORDER — TRAZODONE HCL 50 MG PO TABS: 25.0000 mg | ORAL_TABLET | Freq: Every evening | ORAL | Status: DC | PRN

## 2021-10-08 MED ORDER — ONDANSETRON HCL 4 MG PO TABS: 4.0000 mg | ORAL_TABLET | Freq: Four times a day (QID) | ORAL | Status: DC | PRN

## 2021-10-08 MED ORDER — LORAZEPAM 2 MG/ML IJ SOLN
0.5000 mg | Freq: Once | INTRAMUSCULAR | Status: AC
  Administered 2021-10-08: 0.5 mg via INTRAVENOUS
  Filled 2021-10-08: qty 1

## 2021-10-08 MED ORDER — ENOXAPARIN SODIUM 40 MG/0.4ML IJ SOSY
40.0000 mg | PREFILLED_SYRINGE | INTRAMUSCULAR | Status: DC
  Administered 2021-10-08 – 2021-10-16 (×9): 40 mg via SUBCUTANEOUS
  Filled 2021-10-08 (×9): qty 0.4

## 2021-10-08 MED ORDER — MIDODRINE HCL 5 MG PO TABS
10.0000 mg | ORAL_TABLET | Freq: Three times a day (TID) | ORAL | Status: DC
  Administered 2021-10-09 (×2): 10 mg via ORAL
  Filled 2021-10-08 (×2): qty 2

## 2021-10-08 NOTE — Assessment & Plan Note (Addendum)
-   The patient's dizziness and lightheadedness could certainly related to her bradycardia as well as orthostatic hypotension. - She will be admitted to an observation cardiac telemetry bed. - Cardiology consult will be obtained.  I notified Dr. Nehemiah Massed about the patient. - We will obtain thyroid profile specially given recent treatment with lithium. - We will check orthostatics specially given initial hypotension.

## 2021-10-08 NOTE — Assessment & Plan Note (Addendum)
-   Psychiatry consult will be obtained for adjustment of her psychotic medications that could be contributing to her bradycardia. - I notified Dr. Weber Cooks about the patient. - I will hold off lithium at this time that is currently subtherapeutic pending thyroid profile. - We will restart her Klonopin which she has not been advised to stop.

## 2021-10-08 NOTE — ED Notes (Signed)
Pt's BP has been steadily low since start of shift. Provider contacted and asked for orders. New orders in place

## 2021-10-08 NOTE — Assessment & Plan Note (Signed)
-   This could be associated with orthostatic hypotension contributing to her lightheadedness and dizziness. - We will check orthostatics as mentioned above. - She will be hydrated with IV normal saline.

## 2021-10-08 NOTE — Assessment & Plan Note (Addendum)
-   We will obtain a brain MRI to rule out vertebrobasilar insufficiency. - We will place on as needed Antivert 12.5 mg p.o. 3 times daily as needed. - PT evaluation will be obtained for the possibility of benign positional vertigo for vestibular rehabilitation.

## 2021-10-08 NOTE — ED Notes (Signed)
Pt's mother said that pt has lost 60lbs since Dec. Due to not eating. Pt has said that she has not been moving much due to being depressed.

## 2021-10-08 NOTE — Assessment & Plan Note (Signed)
-   She will be placed on as needed DuoNeb.

## 2021-10-08 NOTE — ED Provider Triage Note (Signed)
  Emergency Medicine Provider Triage Evaluation Note  Ann Morales , a 46 y.o.female,  was evaluated in triage.  Pt complains of persistent dizziness x3 weeks.  Reports room spinning sensation, as well as blurred vision, associated with mild headache.  She states that is progressively getting worse.   Review of Systems  Positive: Headache, dizziness, blurred vision Negative: Denies fever, chest pain, vomiting  Physical Exam  There were no vitals filed for this visit. Gen:   Awake, no distress   Resp:  Normal effort  MSK:   Moves extremities without difficulty  Other:    Medical Decision Making  Given the patient's initial medical screening exam, the following diagnostic evaluation has been ordered. The patient will be placed in the appropriate treatment space, once one is available, to complete the evaluation and treatment. I have discussed the plan of care with the patient and I have advised the patient that an ED physician or mid-level practitioner will reevaluate their condition after the test results have been received, as the results may give them additional insight into the type of treatment they may need.    Diagnostics: Labs, head CT  Treatments: none immediately   Teodoro Spray, [place] 10/08/21 1531

## 2021-10-08 NOTE — ED Triage Notes (Signed)
Patient c/o dizziness ongoing for about a month. States symptoms have been constant and getting worse. Feels like the room is spinning. Reports intermittent headaches.

## 2021-10-08 NOTE — Assessment & Plan Note (Signed)
-   We will continue her CPAP nightly at 12 cm H2O.

## 2021-10-08 NOTE — H&P (Addendum)
[place]   PATIENT NAME: Ann Morales    MR#:  [id number]  DATE OF BIRTH:  1975/08/30  DATE OF ADMISSION:  10/08/2021  PRIMARY CARE PHYSICIAN: Gwyneth Sprout, FNP   Patient is coming from: Home  REQUESTING/REFERRING PHYSICIAN: Valora Piccolo, MD CHIEF COMPLAINT:   Chief Complaint  Patient presents with   Dizziness    HISTORY OF PRESENT ILLNESS:  Ann Morales is a 46 y.o. Caucasian female with medical history significant for adult ADHD, bipolar obstructive sleep apnea on home CPAP, GERD, migraine, pituitary tumor and dyslipidemia who presented to the emergency room with acute onset of  dizziness which started about a month ago and has been getting significantly worse over the last 2 weeks with associated vertigo, as well as lightheadedness and occasional presyncope.  She denies any headache or blurred vision, paresthesias or focal muscle weakness.  She stated that she lost about 70 pounds over the last 7 to 8 months unintentionally.  No chest pain or palpitations.  No dyspnea or cough or wheezing or hemoptysis.  No tinnitus.  No fever or chills.  She denies dysuria, oliguria or hematuria or flank pain.  She ran out of several of her medications.  She was off of her lithium and restarted it about 3 weeks ago.  She continues on Invega subcutaneous injection monthly and she had it a couple weeks ago.  She stopped taking Klonopin when she ran out without medical advice.  She continues on her CPAP at 12 cm H2O for her OSA.  She follows with Dr. Dorann Ou at [facility].  ED Course: When she came to the ER heart rate was 60 with blood pressure 64/51 and later 97/64 heart rate is dropped to 43 with otherwise normal vital signs.  Labs revealed unremarkable CBC and BMP.  High-sensitivity troponin was 6.  Lithium level was subtherapeutic at 0.25 EKG as reviewed by me : EKG showed sinus bradycardia with a rate of 42 with low voltage QRS 70 with conversion anteroseptally and flattened T waves  laterally Imaging: Noncontrasted head CT scan revealed the following: No acute intracranial findings are seen in noncontrast CT brain. There are no signs of bleeding within the cranium. Ventricles are not dilated.   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 Similar finding was seen in previous MRI done on 07/09/2020. There is thinning of anterior cortical margin of sella, possibly due to remodeling caused by pituitary macro adenoma or possible early extension of the adenoma into the sinus.  The patient was given 2 L bolus of IV normal saline and 50 mg of p.o. Antivert.  She will be admitted to an observation cardiac telemetry bed for further evaluation and management. PAST MEDICAL HISTORY:   Past Medical History:  Diagnosis Date   ADHD (attention deficit hyperactivity disorder)    Anxiety    Arthritis    Asthma    well controlled   Bipolar disorder ([place])    COVID-19 03/29/2020   Depression    GERD (gastroesophageal reflux disease)    Headache    migraines   History of kidney stones    currently as of 05-30-19   Sleep apnea    cpap   Wears dentures    partial upper    PAST SURGICAL HISTORY:   Past Surgical History:  Procedure Laterality Date   BACK SURGERY  03/2017   cervical fusion  CHOLECYSTECTOMY     ETHMOIDECTOMY Right 06/12/2020   Procedure: TOTAL ETHMOIDECTOMY;  Surgeon: Clyde Canterbury, MD;  Location: [place];  Service: ENT;  Laterality: Right;   FRONTAL SINUS EXPLORATION Right 06/12/2020   Procedure: FRONTAL SINUS EXPLORATION;  Surgeon: Clyde Canterbury, MD;  Location: [place];  Service: ENT;  Laterality: Right;   GALLBLADDER SURGERY     HYSTEROSCOPY WITH NOVASURE N/A 06/02/2019   Procedure: [place];  Surgeon: Malachy Mood, MD;  Location: [facility];  Service: Gynecology;  Laterality: N/A;    IMAGE GUIDED SINUS SURGERY N/A 06/12/2020   Procedure: IMAGE GUIDED SINUS SURGERY;  Surgeon: Clyde Canterbury, MD;  Location: [place];  Service: ENT;  Laterality: N/A;  PLACED DISK ON OR CHARGE NURSE DESK 3-25 KP   LAPAROSCOPY N/A 12/21/2018   Procedure: LAPAROSCOPY DIAGNOSTIC;  Surgeon: Will Bonnet, MD;  Location: [facility];  Service: Gynecology;  Laterality: N/A;   MAXILLARY ANTROSTOMY Right 06/12/2020   Procedure: MAXILLARY ANTROSTOMY;  Surgeon: Clyde Canterbury, MD;  Location: [place];  Service: ENT;  Laterality: Right;  Covid (+) 03/29/20 sleep apnea   PLANTAR FASCIA RELEASE Left 06/12/2017   Procedure: ENDOSCOPIC PLANTAR FASCIOTOMY-RELEASE;  Surgeon: Samara Deist, DPM;  Location: [facility];  Service: Podiatry;  Laterality: Left;   REPAIR EXTENSOR TENDON Left 06/12/2017   Procedure: REPAIR FLEXOR TENDON;  Surgeon: Samara Deist, DPM;  Location: [facility];  Service: Podiatry;  Laterality: Left;   TARSAL TUNNEL RELEASE Left 06/12/2017   Procedure: TARSAL TUNNEL RELEASE-BAXTER RELEASE;  Surgeon: Samara Deist, DPM;  Location: [facility];  Service: Podiatry;  Laterality: Left;   TUBAL LIGATION      SOCIAL HISTORY:   Social History   Tobacco Use   Smoking status: Every Day    Packs/day: 2.00    Years: 15.00    Total pack years: 30.00    Types: Cigarettes    Start date: 09/25/1994   Smokeless tobacco: Never  Substance Use Topics   Alcohol use: No    Alcohol/week: 0.0 standard drinks of alcohol    FAMILY HISTORY:   Family History  Problem Relation Age of Onset   Hypercholesterolemia Father    Bipolar disorder Mother    Hypercholesterolemia Maternal Grandmother    Hypertension Maternal Grandmother    Anxiety disorder Maternal Grandmother    Depression Maternal Grandmother    Ovarian cancer Maternal Grandmother    Colon cancer Maternal Grandfather 60   Ovarian cancer Paternal Grandmother     DRUG ALLERGIES:   Allergies  Allergen Reactions   Codeine Hives  and Shortness Of Breath   Azithromycin Diarrhea   Calamine-Zinc Oxide Itching   Doxycycline Nausea And Vomiting   Sulfa Antibiotics Other (See Comments)    Severe abdominal pain.   Zinc Other (See Comments)    Burning sensation    REVIEW OF SYSTEMS:   ROS As per history of present illness. All pertinent systems were reviewed above. Constitutional, HEENT, cardiovascular, respiratory, GI, GU, musculoskeletal, neuro, psychiatric, endocrine, integumentary and hematologic systems were reviewed and are otherwise negative/unremarkable except for positive findings mentioned above in the HPI.   MEDICATIONS AT HOME:   Prior to Admission medications   Medication Sig Start Date End Date Taking? Authorizing Provider  lithium 300 MG tablet Take 300 mg by mouth 2 (two) times daily. 05/04/17  Yes [provider]  traZODone (DESYREL) 100 MG tablet Take 100 mg by mouth at bedtime.   Yes [provider]  albuterol (VENTOLIN HFA) 108 (90 Base) MCG/ACT inhaler Inhale 2 puffs into the lungs every 4 (four) hours as needed for wheezing or shortness of breath. Patient not taking: Reported on 10/08/2021 01/19/20   Margarette Canada, NP  amphetamine-dextroamphetamine (ADDERALL) 15 MG tablet Take 15 mg by mouth daily at 2 PM.  Patient not taking: Reported on 10/08/2021 05/04/17   [provider]  azelastine (ASTELIN) 0.1 % nasal spray Place 2 sprays into both nostrils 2 (two) times daily. Use in each nostril as directed Patient not taking: Reported on 10/08/2021 02/26/21   Drubel, Ria Comment, PA-C  buPROPion (WELLBUTRIN XL) 150 MG 24 hr tablet Take 150 mg by mouth every morning.  Patient not taking: Reported on 10/08/2021 04/16/16   [provider]  cefdinir (OMNICEF) 300 MG capsule Take 1 capsule (300 mg total) by mouth 2 (two) times daily. Patient not taking: Reported on 10/08/2021 06/12/20   Clyde Canterbury, MD  chlorhexidine (PERIDEX) 0.12 % solution  12/14/19   [provider]  clobetasol  cream (TEMOVATE) 3.21 % APPLY 1 APPLICATION TOPICALLY 2 (TWO) TIMES DAILY. UP TO 5 DAYS PER WEEK Patient not taking: Reported on 10/08/2021 08/07/20   Ralene Bathe, MD  clonazePAM (KLONOPIN) 0.5 MG tablet Take 1 tablet (0.5 mg total) by mouth 2 (two) times daily as needed for anxiety. Patient not taking: Reported on 10/08/2021 04/16/15   Marjie Skiff, MD  DOVONEX 0.005 % cream Apply topically 2 (two) times daily. Patient not taking: Reported on 10/08/2021 08/09/20   Ralene Bathe, MD  fluticasone [facility]) 50 MCG/ACT nasal spray Place 2 sprays into both nostrils daily. Patient not taking: Reported on 10/08/2021 01/11/20   Sharion Balloon, FNP  HYDROcodone-acetaminophen (NORCO/VICODIN) 5-325 MG tablet Take 1-2 tablets by mouth every 6 (six) hours as needed for moderate pain. Patient not taking: Reported on 10/08/2021 06/12/20   Clyde Canterbury, MD  ibuprofen (ADVIL) 800 MG tablet Take 1 tablet (800 mg total) by mouth every 8 (eight) hours as needed. Patient not taking: Reported on 10/08/2021 07/24/20   Chrismon, Driscilla Grammes  INVEGA SUSTENNA 234 MG/1.5ML SUSY injection Inject into the muscle. 02/28/20   [provider]  ipratropium (ATROVENT) 0.03 % nasal spray Place 2 sprays into both nostrils 3 (three) times daily as needed for rhinitis. Patient not taking: Reported on 10/08/2021 04/12/19   Rodell Perna A, PA-C  LATUDA 40 MG TABS tablet Take 40 mg by mouth daily after supper. Patient not taking: Reported on 10/08/2021 02/06/17   [provider]  lidocaine (XYLOCAINE) 2 % solution  12/21/19   [provider]  lisdexamfetamine (VYVANSE) 40 MG capsule Take 40 mg by mouth in the morning.  Patient not taking: Reported on 10/08/2021    [provider]  loratadine (CLARITIN) 10 MG tablet One daily as needed for allergies Patient not taking: Reported on 10/08/2021 11/20/17   Carmon Ginsberg, PA  meclizine (ANTIVERT) 25 MG tablet Take 1 tablet (25 mg total) by mouth 3 (three) times  daily as needed for dizziness. Patient not taking: Reported on 10/08/2021 02/26/21   Mikey Kirschner, PA-C  montelukast (SINGULAIR) 10 MG tablet Take 1 tablet (10 mg total) by mouth at bedtime. Patient not taking: Reported on 10/08/2021 03/29/20   Trinna Post, PA-C  omeprazole (PRILOSEC) 20 MG capsule TAKE 1 CAPSULE BY MOUTH EVERY DAY Patient not taking: Reported on 10/08/2021 03/27/20   Trinna Post, PA-C  ondansetron (ZOFRAN ODT) 8 MG disintegrating tablet Take 1 tablet (  8 mg total) by mouth every 8 (eight) hours as needed for nausea or vomiting. Patient not taking: Reported on 10/08/2021 01/19/20   Margarette Canada, NP  paliperidone (INVEGA) 6 MG 24 hr tablet Take 6 mg by mouth at bedtime. Patient not taking: Reported on 10/08/2021 02/29/20   [provider]  predniSONE (STERAPRED UNI-PAK 21 TAB) 10 MG (21) TBPK tablet Sterapred DS 6 day taper. Take as directed. Patient not taking: Reported on 10/08/2021 06/12/20   Clyde Canterbury, MD  sodium chloride (OCEAN) 0.65 % SOLN nasal spray Place 2 sprays into both nostrils as needed for congestion. Patient not taking: Reported on 10/08/2021 03/08/20   Flinchum, Kelby Aline, FNP  Spacer/Aero-Holding Chambers (AEROCHAMBER MV) inhaler Use as instructed 01/19/20   Margarette Canada, NP  SUMAtriptan (IMITREX) 50 MG tablet May repeat in 2 hours if headache persists or recurs. Patient not taking: Reported on 10/08/2021 07/24/20   Chrismon, Vickki Muff, PA-C  topiramate (TOPAMAX) 50 MG tablet Take 1 tablet (50 mg total) by mouth at bedtime. Patient not taking: Reported on 10/08/2021 12/06/19   Trinna Post, PA-C  triamcinolone (KENALOG) 0.025 % ointment Apply 1 application topically 2 (two) times daily. Patient not taking: Reported on 10/08/2021 04/17/20   Kris Hartmann, NP  TRINTELLIX 10 MG TABS tablet Take 10 mg by mouth daily. Patient not taking: Reported on 10/08/2021 06/11/20   [provider]  mirabegron ER (MYRBETRIQ) 25 MG TB24 tablet Take 1 tablet (25 mg  total) by mouth daily. 04/25/19 01/19/20  Trinna Post, PA-C      VITAL SIGNS:  Blood pressure 93/60, pulse (!) 53, temperature 98.1 F (36.7 C), temperature source Oral, resp. rate 11, height '5\' 7"'$  (1.702 m), weight 75.3 kg, SpO2 98 %.  PHYSICAL EXAMINATION:  Physical Exam  GENERAL:  46 y.o.-year-old Caucasian female patient lying in the bed with no acute distress.  EYES: Pupils equal, round, reactive to light and accommodation. No scleral icterus. Extraocular muscles intact.  HEENT: Head atraumatic, normocephalic. Oropharynx and nasopharynx clear.  NECK:  Supple, no jugular venous distention. No thyroid enlargement, no tenderness.  LUNGS: Normal breath sounds bilaterally, no wheezing, rales,rhonchi or crepitation. No use of accessory muscles of respiration.  CARDIOVASCULAR: Regular rate and rhythm, bradycardic,  S1, S2 normal. No murmurs, rubs, or gallops.  ABDOMEN: Soft, nondistended, nontender. Bowel sounds present. No organomegaly or mass.  EXTREMITIES: No pedal edema, cyanosis, or clubbing.  NEUROLOGIC: Cranial nerves II through XII are intact. Muscle strength 5/5 in all extremities. Sensation intact. Gait not checked.  PSYCHIATRIC: The patient is alert and oriented x 3.  Normal affect and good eye contact. SKIN: No obvious rash, lesion, or ulcer.   LABORATORY PANEL:   CBC Recent Labs  Lab 10/08/21 1535  WBC 9.5  HGB 14.9  HCT 46.1*  PLT 217   ------------------------------------------------------------------------------------------------------------------  Chemistries  Recent Labs  Lab 10/08/21 1535  NA 139  K 3.8  CL 104  CO2 29  GLUCOSE 94  BUN 13  CREATININE 1.07*  CALCIUM 9.3   ------------------------------------------------------------------------------------------------------------------  Cardiac Enzymes No results for input(s): "TROPONINI" in the last 168  hours. ------------------------------------------------------------------------------------------------------------------  RADIOLOGY:  CT Head Wo Contrast  Result Date: 10/08/2021 CLINICAL DATA:  Dizziness EXAM: CT HEAD WITHOUT CONTRAST TECHNIQUE: Contiguous axial images were obtained from the base of the skull through the vertex without intravenous contrast. RADIATION DOSE REDUCTION: This exam was performed according to the departmental dose-optimization program which includes automated exposure control, adjustment of the mA and/or  kV according to patient size and/or use of iterative reconstruction technique. COMPARISON:  MR brain done on 07/09/2020 FINDINGS: Brain: No acute intracranial findings are seen. There are no signs of bleeding within the cranium. Ventricles are not dilated. 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 This finding has not changed significantly since previous MRI done on 07/09/2020. There is thinning of cortical margin in the anterior aspect of sella. Vascular: Unremarkable. Skull: No acute findings are seen. Sinuses/Orbits: Unremarkable. Other: None. IMPRESSION: No acute intracranial findings are seen in noncontrast CT brain. There are no signs of bleeding within the cranium. Ventricles are not dilated. 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 Similar finding was seen in previous MRI done on 07/09/2020. There is thinning of anterior cortical margin of sella, possibly due to remodeling caused by pituitary macro adenoma or possible early extension of the adenoma into the sinus. Electronically Signed   By: Elmer Picker M.D.   On: 10/08/2021 15:55      IMPRESSION AND PLAN:  Assessment and Plan: * Symptomatic bradycardia - The  patient's dizziness and lightheadedness could certainly related to her bradycardia as well as orthostatic hypotension. - She will be admitted to an observation cardiac telemetry bed. - Cardiology consult will be obtained.  I notified Dr. Nehemiah Massed about the patient. - We will obtain thyroid profile specially given recent treatment with lithium. - We will check orthostatics specially given initial hypotension.  Hypotension - This could be associated with orthostatic hypotension contributing to her lightheadedness and dizziness. - We will check orthostatics as mentioned above. - She will be hydrated with IV normal saline.  Vertigo - We will obtain a brain MRI to rule out vertebrobasilar insufficiency. - We will place on as needed Antivert 12.5 mg p.o. 3 times daily as needed. - PT evaluation will be obtained for the possibility of benign positional vertigo for vestibular rehabilitation.  Bipolar disorder [facility]) - Psychiatry consult will be obtained for adjustment of her psychotic medications that could be contributing to her bradycardia. - I notified Dr. Weber Cooks about the patient. - I will hold off lithium at this time that is currently subtherapeutic pending thyroid profile. - We will restart her Klonopin which she has not been advised to stop.  Pituitary macroadenoma ([place]) - This could be affecting her thyroid levels which could be contributing to her bradycardia, hypotension as well as abnormal weight loss. - We are checking thyroid profile. - Her macroadenoma is currently stable compared to previous MRI.  Asthma, chronic - She will be placed on as needed DuoNeb.  Obstructive sleep apnea - We will continue her CPAP nightly at 12 cm H2O.   DVT prophylaxis: Lovenox.  Advanced Care Planning:  Code Status: full code.  Family Communication:  The plan of care was discussed in details with the patient (and family). I answered all questions. The patient agreed to proceed with the above  mentioned plan. Further management will depend upon hospital course. Disposition Plan: Back to previous home environment Consults called: Psychiatry, cardiology All the records are reviewed and case discussed with ED provider.  Status is: Observation   I certify that at the time of admission, it is my clinical judgment that the patient will require hospital care extending less than 2 midnights.  Dispo: The patient is from: Home              Anticipated d/c is to: Home              Patient currently is not medically stable to d/c.              Difficult to place patient: No  Christel Mormon M.D on 10/08/2021 at 10:02 PM  [facility]   From 7 PM-7 AM, contact night-coverage [email or web address]  CC: Primary care physician; Gwyneth Sprout, FNP

## 2021-10-08 NOTE — Assessment & Plan Note (Addendum)
-   This could be affecting her thyroid levels which could be contributing to her bradycardia, hypotension as well as abnormal weight loss. - We are checking thyroid profile. - Her macroadenoma is currently stable compared to previous MRI.

## 2021-10-09 ENCOUNTER — Observation Stay: Payer: Medicaid Other

## 2021-10-09 DIAGNOSIS — E785 Hyperlipidemia, unspecified: Secondary | ICD-10-CM | POA: Diagnosis present

## 2021-10-09 DIAGNOSIS — Z981 Arthrodesis status: Secondary | ICD-10-CM | POA: Diagnosis not present

## 2021-10-09 DIAGNOSIS — Z83438 Family history of other disorder of lipoprotein metabolism and other lipidemia: Secondary | ICD-10-CM | POA: Diagnosis not present

## 2021-10-09 DIAGNOSIS — D352 Benign neoplasm of pituitary gland: Secondary | ICD-10-CM | POA: Diagnosis present

## 2021-10-09 DIAGNOSIS — I951 Orthostatic hypotension: Secondary | ICD-10-CM | POA: Diagnosis present

## 2021-10-09 DIAGNOSIS — F319 Bipolar disorder, unspecified: Secondary | ICD-10-CM | POA: Diagnosis present

## 2021-10-09 DIAGNOSIS — Z818 Family history of other mental and behavioral disorders: Secondary | ICD-10-CM | POA: Diagnosis not present

## 2021-10-09 DIAGNOSIS — G43909 Migraine, unspecified, not intractable, without status migrainosus: Secondary | ICD-10-CM | POA: Diagnosis present

## 2021-10-09 DIAGNOSIS — R001 Bradycardia, unspecified: Secondary | ICD-10-CM | POA: Diagnosis present

## 2021-10-09 DIAGNOSIS — H5509 Other forms of nystagmus: Secondary | ICD-10-CM | POA: Diagnosis present

## 2021-10-09 DIAGNOSIS — R42 Dizziness and giddiness: Secondary | ICD-10-CM | POA: Diagnosis present

## 2021-10-09 DIAGNOSIS — Z881 Allergy status to other antibiotic agents status: Secondary | ICD-10-CM | POA: Diagnosis not present

## 2021-10-09 DIAGNOSIS — Z882 Allergy status to sulfonamides status: Secondary | ICD-10-CM | POA: Diagnosis not present

## 2021-10-09 DIAGNOSIS — Z888 Allergy status to other drugs, medicaments and biological substances status: Secondary | ICD-10-CM | POA: Diagnosis not present

## 2021-10-09 DIAGNOSIS — G4733 Obstructive sleep apnea (adult) (pediatric): Secondary | ICD-10-CM | POA: Diagnosis present

## 2021-10-09 DIAGNOSIS — J45909 Unspecified asthma, uncomplicated: Secondary | ICD-10-CM | POA: Diagnosis present

## 2021-10-09 DIAGNOSIS — K219 Gastro-esophageal reflux disease without esophagitis: Secondary | ICD-10-CM | POA: Diagnosis present

## 2021-10-09 DIAGNOSIS — Z885 Allergy status to narcotic agent status: Secondary | ICD-10-CM | POA: Diagnosis not present

## 2021-10-09 DIAGNOSIS — Z8616 Personal history of COVID-19: Secondary | ICD-10-CM | POA: Diagnosis not present

## 2021-10-09 DIAGNOSIS — F17210 Nicotine dependence, cigarettes, uncomplicated: Secondary | ICD-10-CM | POA: Diagnosis present

## 2021-10-09 DIAGNOSIS — E876 Hypokalemia: Secondary | ICD-10-CM | POA: Diagnosis present

## 2021-10-09 DIAGNOSIS — Z79899 Other long term (current) drug therapy: Secondary | ICD-10-CM | POA: Diagnosis not present

## 2021-10-09 LAB — BASIC METABOLIC PANEL
Anion gap: 6 (ref 5–15)
BUN: 10 mg/dL (ref 6–20)
CO2: 24 mmol/L (ref 22–32)
Calcium: 7.7 mg/dL — ABNORMAL LOW (ref 8.9–10.3)
Chloride: 110 mmol/L (ref 98–111)
Creatinine, Ser: 0.91 mg/dL (ref 0.44–1.00)
GFR, Estimated: >60 mL/min (ref >60)
Glucose, Bld: 96 mg/dL (ref 70–99)
Potassium: 3.4 mmol/L — ABNORMAL LOW (ref 3.5–5.1)
Sodium: 140 mmol/L (ref 135–145)

## 2021-10-09 MED ORDER — POTASSIUM CHLORIDE 20 MEQ PO PACK
40.0000 meq | PACK | Freq: Once | ORAL | Status: AC
  Administered 2021-10-09: 40 meq via ORAL
  Filled 2021-10-09: qty 2

## 2021-10-09 MED ORDER — MIDODRINE HCL 5 MG PO TABS: 2.5000 mg | ORAL_TABLET | Freq: Three times a day (TID) | ORAL | Status: DC

## 2021-10-09 NOTE — Progress Notes (Signed)
[facility] Progress Note  Patient: Ann Morales     MLY:[id number]  DOA: 10/08/2021   PCP: Gwyneth Sprout, FNP       Brief hospital course: This is a 46 year old female with bipolar disorder, asthma, obstructive sleep apnea may, sellar/suprasellar mass, migraines, dyslipidemia who presents to the hospital for dizziness.  He states the dizziness has been present for about 1 month. She tells me she has not seen her PCP in about 1 to 2 years however, there is a note in the chart from her PCP who evaluated her on 12/22 for "dizziness" and diagnosed her with vertigo.  I have discussed this with the patient.  An MRI was performed in the ED and is essentially unchanged regarding the sellar/suprasellar mass.  The patient was given 2-1/2 L of IV fluids and boluses and then started on 200 cc/h of normal saline along with midodrine. She was also noted to have a heart rate in the 40s and otherwise normal vital signs.  In regards to her psych medications, she was off her lithium and restarted about 3 weeks ago.  She continues to receive Invega injections monthly and last had one about 2 weeks ago.  She did run out of Klonopin and did not resume this  Subjective:   She is a poor historian Continues to complain of dizziness mainly when sitting up or standing.  It is not present when she is sitting still. She has no other complaints  Assessment and Plan: Principal Problem:   Orthostatic hypotension -Blood pressures in the 80s at laying and drops to 60s with standing.  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 - I do notice a horizontal nystagmus when she looks to the right -  Will await vestibular evaluation  Hypokalemia - Replace and recheck tomorrow    Bipolar disorder ([place]) -Lithium level is low - Have asked psychiatry to evaluate her to help decide which medications to resume    Obstructive sleep apnea -Continue CPAP    Asthma, chronic -No wheezing or dyspnea    Pituitary macroadenoma ([place]) -Continue outpatient follow-up  DVT prophylaxis:  Place TED hose Start: 10/09/21 1312 enoxaparin (LOVENOX) injection 40 mg Start: 10/08/21 1930     Code Status: Full Code  Consultants: Psychiatry, cardiology Level of Care: Level of care: Telemetry Cardiac Disposition Plan:  Status is: Observation The patient will require care spanning > 2 midnights and should be moved to inpatient because: Still orthostatic  Objective:   Vitals:   10/09/21 1000 10/09/21 1230 10/09/21 1330 10/09/21 1400  BP: 98/61 107/60 111/73 98/61  Pulse: (!) 56 (!) 56 (!) 52 (!) 51  Resp: 11   18  Temp:    98.6 F (37 C)  TempSrc:    Oral  SpO2: 99% 97% 98% 99%  Weight:      Height:       Filed Weights   10/08/21 1533  Weight: 75.3 kg   Exam: General exam: Appears comfortable  HEENT: PERRLA, oral mucosa moist, no sclera icterus or thrush Respiratory system: Clear to auscultation. Respiratory effort normal. Cardiovascular system: S1 & S2 heard, regular rate and rhythm Gastrointestinal  system: Abdomen soft, non-tender, nondistended. Normal bowel sounds   Central nervous system: Alert and oriented. No focal neurological deficits.  Horizontal nystagmus when looking to the right Extremities: No cyanosis, clubbing or edema Skin: No rashes or ulcers Psychiatry: Flat affect  Imaging and lab data was personally reviewed    CBC: Recent Labs  Lab 10/08/21 1535  WBC 9.5  NEUTROABS 6.4  HGB 14.9  HCT 46.1*  MCV 86.2  PLT 110   Basic Metabolic Panel: Recent Labs  Lab 10/08/21 1535 10/09/21 0604  NA 139 140  K 3.8 3.4*  CL 104 110  CO2 29 24  GLUCOSE 94 96  BUN  13 10  CREATININE 1.07* 0.91  CALCIUM 9.3 7.7*   GFR: Estimated Creatinine Clearance: 81.8 mL/min (by C-G formula based on SCr of 0.91 mg/dL).  Scheduled Meds:  enoxaparin (LOVENOX) injection  40 mg Subcutaneous Q24H   montelukast  10 mg Oral QHS   potassium chloride  40 mEq Oral Once   Continuous Infusions:  sodium chloride 100 mL/hr at 10/09/21 0758     LOS: 0 days   Author: Eunice Blase Arinze Rivadeneira  10/09/2021 4:01 PM

## 2021-10-09 NOTE — ED Notes (Signed)
Pt given med for MRI procedure after MRI tech called to say would be to ER in 15 minutes. Pt in gown and unhooked.

## 2021-10-09 NOTE — ED Notes (Signed)
Patient transported to MRI 

## 2021-10-09 NOTE — ED Notes (Signed)
Pt reports feeling "a little dizzy" with standing.

## 2021-10-09 NOTE — Consult Note (Addendum)
Patient was seen evaluated and examined by me and the PA on 10/09/21.  Course of action, evaluation, and management decisions were developed solely by me, but detailed below in the PA's note.  [place] NOTE       Patient ID: Ann Morales MRN: [id number] DOB/AGE: [birth year] 46 y.o.  Admit date: 10/08/2021 Referring Physician Dr. Debbe Odea Primary Physician Mikey Kirschner, PA-C Primary Cardiologist none Reason for Consultation symptomatic bradycardia   HPI: Ann Morales is a 46yoF with a PMH of bipolar disorder on lithium, invega injections, trazodone, ADHD, known pituitary adenoma, OSA on CPAP, dyslipidemia who presented to [facility] ED 10/08/21 with dizziness, lightheadedness, and occasional presyncope. Cardiology is consulted for assistance with her bradycardia.   Patient says she "does not feel good" and has been dizzy constantly over the past month.  She says it is much worse with standing and gets somewhat better not completely when she lies down.  She cannot discern whether she feels as if the room is spinning around her or not.  She says she has had the symptoms before and was told it was vertigo but she says this feels different in someway.  She denies any syncopal episodes, chest pain, shortness of breath, lower extremity edema.  She has never seen a cardiologist in the past and tells me she has been compliant with all her medications, although per chart review she was apparently off her lithium for an unknown amount of time after running out of it and restarted about 3 weeks ago.  Lithium level subtherapeutic at 0.25 on admission.  She has a known pituitary adenoma for which she was evaluated at [facility] neurosurgery and subsequently [place] [facility] for further surgical management, however she says she ended up missing her appointment with [place] [facility] due to car trouble and has not followed up since September 2022.  Her EKG on presentation shows sinus  bradycardia with rate 42, review of EKG from 2019 also shows sinus bradycardia with rate in the 50s.  Review of telemetry shows sinus bradycardia with rate mostly in the mid 40s to 50s, with several short sinus pauses, lasting less than 3 seconds.  During interview heart rate increases to the mid 50s to low 60s with conversation.  Review of systems complete and found to be negative unless listed above     Past Medical History:  Diagnosis Date   ADHD (attention deficit hyperactivity disorder)    Anxiety    Arthritis    Asthma    well controlled   Bipolar disorder ([place])    COVID-19 03/29/2020   Depression    GERD (gastroesophageal reflux disease)    Headache    migraines   History of kidney stones    currently as of 05-30-19   Sleep apnea    cpap   Wears dentures    partial upper    Past Surgical History:  Procedure Laterality Date   BACK SURGERY  03/2017   cervical fusion   CHOLECYSTECTOMY     ETHMOIDECTOMY Right 06/12/2020   Procedure: TOTAL ETHMOIDECTOMY;  Surgeon: Clyde Canterbury, MD;  Location: [place];  Service: ENT;  Laterality: Right;   FRONTAL SINUS EXPLORATION Right 06/12/2020   Procedure: FRONTAL SINUS EXPLORATION;  Surgeon: Clyde Canterbury, MD;  Location: [place];  Service: ENT;  Laterality: Right;   GALLBLADDER SURGERY     HYSTEROSCOPY WITH NOVASURE N/A 06/02/2019   Procedure: DILATATION & HYSTEROSCOPY WITH NOVASURE ABLATION;  Surgeon: Malachy Mood, MD;  Location: [facility];  Service: Gynecology;  Laterality: N/A;   IMAGE GUIDED SINUS SURGERY N/A 06/12/2020   Procedure: IMAGE GUIDED SINUS SURGERY;  Surgeon: Clyde Canterbury, MD;  Location: [place];  Service: ENT;  Laterality: N/A;  PLACED DISK ON OR CHARGE NURSE DESK 3-25 KP   LAPAROSCOPY N/A 12/21/2018   Procedure: LAPAROSCOPY DIAGNOSTIC;  Surgeon: Will Bonnet, MD;  Location: [facility];  Service: Gynecology;  Laterality: N/A;   MAXILLARY ANTROSTOMY Right 06/12/2020   Procedure:  MAXILLARY ANTROSTOMY;  Surgeon: Clyde Canterbury, MD;  Location: [place];  Service: ENT;  Laterality: Right;  Covid (+) 03/29/20 sleep apnea   PLANTAR FASCIA RELEASE Left 06/12/2017   Procedure: ENDOSCOPIC PLANTAR FASCIOTOMY-RELEASE;  Surgeon: Samara Deist, DPM;  Location: [facility];  Service: Podiatry;  Laterality: Left;   REPAIR EXTENSOR TENDON Left 06/12/2017   Procedure: REPAIR FLEXOR TENDON;  Surgeon: Samara Deist, DPM;  Location: [facility];  Service: Podiatry;  Laterality: Left;   TARSAL TUNNEL RELEASE Left 06/12/2017   Procedure: TARSAL TUNNEL RELEASE-BAXTER RELEASE;  Surgeon: Samara Deist, DPM;  Location: [facility];  Service: Podiatry;  Laterality: Left;   TUBAL LIGATION      (Not in a hospital admission)  Social History   Socioeconomic History   Marital status: Single    Spouse name: Not on file   Number of children: Not on file   Years of education: Not on file   Highest education level: Not on file  Occupational History   Not on file  Tobacco Use   Smoking status: Every Day    Packs/day: 2.00    Years: 15.00    Total pack years: 30.00    Types: Cigarettes    Start date: 09/25/1994   Smokeless tobacco: Never  Vaping Use   Vaping Use: Never used  Substance and Sexual Activity   Alcohol use: No    Alcohol/week: 0.0 standard drinks of alcohol   Drug use: No   Sexual activity: Yes    Birth control/protection: Surgical  Other Topics Concern   Not on file  Social History Narrative   Not on file   Social Determinants of Health   Financial Resource Strain: Not on file  Food Insecurity: Not on file  Transportation Needs: Not on file  Physical Activity: Not on file  Stress: Not on file  Social Connections: Not on file  Intimate Partner Violence: Not on file    Family History  Problem Relation Age of Onset   Hypercholesterolemia Father    Bipolar disorder Mother    Hypercholesterolemia Maternal Grandmother    Hypertension Maternal Grandmother    Anxiety  disorder Maternal Grandmother    Depression Maternal Grandmother    Ovarian cancer Maternal Grandmother    Colon cancer Maternal Grandfather 60   Ovarian cancer Paternal Grandmother       PHYSICAL EXAM General: Caucasian female , well nourished, in no acute distress. Laying flat in ed stretcher HEENT:  Normocephalic and atraumatic. Neck:  No JVD.  Lungs: Normal respiratory effort on room air. Clear bilaterally to auscultation. No wheezes, crackles, rhonchi.  Heart: bradycardic but regular. Normal S1 and S2 without gallops or murmurs. Radial & DP pulses 2+ bilaterally. Abdomen: Non-distended appearing.  Msk: Normal strength and tone for age. Extremities: Warm and well perfused. No clubbing, cyanosis. No peripheral edema.  Neuro: Alert and oriented X 3. Psych:  flat affect. answers questions appropriately.   Labs:   Lab Results  Component Value Date   WBC  9.5 10/08/2021   HGB 14.9 10/08/2021   HCT 46.1 (H) 10/08/2021   MCV 86.2 10/08/2021   PLT 217 10/08/2021    Recent Labs  Lab 10/09/21 0604  NA 140  K 3.4*  CL 110  CO2 24  BUN 10  CREATININE 0.91  CALCIUM 7.7*  GLUCOSE 96   Lab Results  Component Value Date   CKTOTAL 82 05/28/2013   CKMB 0.6 05/28/2013   TROPONINI < 0.02 05/28/2013   No results found for: "CHOL" No results found for: "HDL" No results found for: "LDLCALC" No results found for: "TRIG" No results found for: "CHOLHDL" No results found for: "LDLDIRECT"    Radiology: MR BRAIN WO CONTRAST  Result Date: 10/09/2021 CLINICAL DATA:  Vertigo EXAM: MRI HEAD WITHOUT CONTRAST TECHNIQUE: Multiplanar, multiecho pulse sequences of the brain and surrounding structures were obtained without intravenous contrast. COMPARISON:  07/09/2020 brain MRI FINDINGS: Brain: No acute infarct, mass effect or extra-axial collection. No acute or chronic hemorrhage. Normal white matter signal, parenchymal volume and CSF spaces. The midline structures are normal. 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 Vascular: Major flow voids are preserved. Skull and upper cervical spine: Normal calvarium and skull base. Visualized upper cervical spine and soft tissues are normal. Sinuses/Orbits:No paranasal sinus fluid levels or advanced mucosal thickening. No mastoid or middle ear effusion. Normal orbits. IMPRESSION: 1. No acute intracranial abnormality. 2. 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 Electronically Signed   By: Ulyses Jarred M.D.   On: 10/09/2021 01:32   CT Head Wo Contrast  Result Date: 10/08/2021 CLINICAL DATA:  Dizziness EXAM: CT HEAD WITHOUT CONTRAST TECHNIQUE: Contiguous axial images were obtained from the base of the skull through the vertex without intravenous contrast. RADIATION DOSE REDUCTION: This exam was performed according to the departmental dose-optimization program which includes automated exposure control, adjustment of the mA and/or kV according to patient size and/or use of iterative reconstruction technique. COMPARISON:  MR brain done on 07/09/2020 FINDINGS: Brain: No acute intracranial findings are seen. There are no signs of bleeding within the cranium. Ventricles are not dilated. 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 This finding has not changed significantly since previous MRI done on 07/09/2020. There is thinning of cortical margin in the anterior aspect of sella. Vascular: Unremarkable. Skull: No acute findings are seen. Sinuses/Orbits: Unremarkable. Other: None. IMPRESSION: No acute intracranial findings are seen in noncontrast CT brain. There are no signs of bleeding within the cranium. Ventricles are not dilated. 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 Similar finding was seen in previous MRI done on 07/09/2020. There is thinning of anterior cortical margin of sella, possibly due to remodeling caused by pituitary macro adenoma or possible early extension of the adenoma into the sinus. Electronically Signed   By: Elmer Picker M.D.   On: 10/08/2021 15:55    ECHO no prior available for review  TELEMETRY reviewed by me: Sinus bradycardia with rates in the mid to high 40s to low 50s, increasing to the 50s and 60s with conversation and ambulation per nursing with several episodes of sinus pauses lasting less than 3 seconds.  EKG reviewed by me: Sinus bradycardia rate 42 nonspecific T wave abnormality  ASSESSMENT AND PLAN:  Ann Morales is a 47yoF with a PMH of bipolar disorder on lithium, invega injections, trazodone, ADHD, known pituitary adenoma, OSA on CPAP, dyslipidemia who presented to [facility] ED 10/08/21 with dizziness, lightheadedness, and occasional presyncope. Cardiology is consulted for assistance with her bradycardia.   #Dizziness #Pituitary adenoma #Bipolar disorder #Sinus bradycardia The patient presents with a month history of dizziness that is worse when standing and walking and better when she is lying down.  She denies any episodes of syncope, palpitations, chest pain.  She is was hypotensive on presentation which improved with IV fluids and midodrine overnight and has been in sinus bradycardia with rate in the mid 40s to 50s while she is resting, and heart rate improves to the mid 50s to 60s during interview with conversation while she is alert.  She is not on antihypertensives, beta blockers, or CCBs but does take lithium for her bipolar disorder in addition to Invega injections monthly and trazodone. Unclear if her bradycardia is the primary etiology of her dizziness vs orthostasis vs her known pituitary adenoma is playing a role  -Continuous monitoring on telemetry -Consider  low-dose midodrine for orthostatic hypotension -monitor and replete electrolytes -conservative management from a cardiac standpoint, consider psychiatry input for further medication management   This patient's plan of care was discussed and created with Dr. Clayborn Bigness and he is in agreement.  Signed: Tristan Schroeder , PA-C 10/09/2021, 9:16 AM [facility]

## 2021-10-09 NOTE — Progress Notes (Signed)
PT Cancellation Note  Patient Details Name: Ann Morales MRN: [id number] DOB: 16-Sep-1975   Cancelled Treatment:    Reason Eval/Treat Not Completed: Other (comment). Per chart review and discussion with RN, pt with orthostatic hypotension. PT to hold exertional activity pending further workup, MD and RN notified, to re-attempt as able.    Lieutenant Diego PT, DPT 1:29 PM,10/09/21

## 2021-10-09 NOTE — ED Notes (Signed)
Pt's HR noted to increase into the mid 50s- low 60s w/ activity (using toilet).

## 2021-10-09 NOTE — ED Provider Notes (Incomplete)
[facility] Provider Note   Event Date/Time   First MD Initiated Contact with Patient 10/08/21 1701     (approximate) History  Dizziness  HPI Ann Morales is a 46 y.o. female with a past medical history of bipolar disorder who presents for lightheadedness and vertigo that has been worsening over the last few weeks.  Patient states that when she gets up from a seated position she becomes extremely lightheaded.  Patient ROS: Patient currently denies any vision changes, tinnitus, difficulty speaking, facial droop, sore throat, chest pain, shortness of breath, abdominal pain, nausea/vomiting/diarrhea, dysuria, or weakness/numbness/paresthesias in any extremity   Physical Exam  Triage Vital Signs: ED Triage Vitals  Enc Vitals Group     BP 10/08/21 1536 (!) 64/51     Pulse Rate 10/08/21 1531 60     Resp 10/08/21 1531 16     Temp 10/08/21 1531 98.5 F (36.9 C)     Temp Source 10/08/21 1531 Oral     SpO2 10/08/21 1531 100 %     Weight 10/08/21 1533 166 lb (75.3 kg)     Height 10/08/21 1533 '5\' 7"'$  (1.702 m)     Head Circumference --      Peak Flow --      Pain Score 10/08/21 1532 0     Pain Loc --      Pain Edu? --      Excl. in [place]? --    Most recent vital signs: Vitals:   10/08/21 2245 10/08/21 2315  BP: (!) 83/47 (!) 84/61  Pulse: (!) 50 (!) 50  Resp:    Temp:    SpO2: 96% 96%   General: Awake, oriented x4.*** CV:  Good peripheral perfusion. *** Resp:  Normal effort. *** Abd:  No distention. *** Other:  *** ED Results / Procedures / Treatments  Labs (all labs ordered are listed, but only abnormal results are displayed) Labs Reviewed  BASIC METABOLIC PANEL - Abnormal; Notable for the following components:      Result Value   Creatinine, Ser 1.07 (*)    All other components within normal limits  CBC WITH DIFFERENTIAL/PLATELET - Abnormal; Notable for the following components:   RBC 5.35 (*)    HCT 46.1 (*)    All other components within  normal limits  LITHIUM LEVEL - Abnormal; Notable for the following components:   Lithium Lvl 0.25 (*)    All other components within normal limits  TSH  T4, FREE  BASIC METABOLIC PANEL  HIV ANTIBODY (ROUTINE TESTING W REFLEX)  T3, FREE  TROPONIN I (HIGH SENSITIVITY)  TROPONIN I (HIGH SENSITIVITY)   EKG ED ECG REPORT I, Naaman Plummer, the attending physician, personally viewed and interpreted this ECG. Date: 10/09/2021 EKG Time: *** Rate: *** Rhythm: normal sinus rhythm QRS Axis: normal Intervals: normal ST/T Wave abnormalities: normal Narrative Interpretation: no evidence of acute ischemia RADIOLOGY ED MD interpretation:  *** -Agree with radiology assessment Official radiology report(s): CT Head Wo Contrast  Result Date: 10/08/2021 CLINICAL DATA:  Dizziness EXAM: CT HEAD WITHOUT CONTRAST TECHNIQUE: Contiguous axial images were obtained from the base of the skull through the vertex without intravenous contrast. RADIATION DOSE REDUCTION: This exam was performed according to the departmental dose-optimization program which includes automated exposure control, adjustment of the mA and/or kV according to patient size and/or use of iterative reconstruction technique. COMPARISON:  MR brain done on 07/09/2020 FINDINGS: Brain: No acute intracranial findings are seen. There are no signs of  bleeding within the cranium. Ventricles are not dilated. 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 This finding has not changed significantly since previous MRI done on 07/09/2020. There is thinning of cortical margin in the anterior aspect of sella. Vascular: Unremarkable. Skull: No acute findings are seen. Sinuses/Orbits: Unremarkable. Other: None. IMPRESSION: No acute intracranial findings are seen in noncontrast CT brain. There are no signs of bleeding within the cranium.  Ventricles are not dilated. 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 Similar finding was seen in previous MRI done on 07/09/2020. There is thinning of anterior cortical margin of sella, possibly due to remodeling caused by pituitary macro adenoma or possible early extension of the adenoma into the sinus. Electronically Signed   By: Elmer Picker M.D.   On: 10/08/2021 15:55   PROCEDURES: Critical Care performed: {CriticalCareYesNo:19197::"Yes, see critical care procedure note(s)","No"} Procedures MEDICATIONS ORDERED IN ED: Medications  enoxaparin (LOVENOX) injection 40 mg (40 mg Subcutaneous Given 10/08/21 2127)  acetaminophen (TYLENOL) tablet 650 mg (has no administration in time range)    Or  acetaminophen (TYLENOL) suppository 650 mg (has no administration in time range)  traZODone (DESYREL) tablet 25 mg (has no administration in time range)  magnesium hydroxide (MILK OF MAGNESIA) suspension 30 mL (has no administration in time range)  ondansetron (ZOFRAN) tablet 4 mg (has no administration in time range)    Or  ondansetron (ZOFRAN) injection 4 mg (has no administration in time range)  traZODone (DESYREL) tablet 100 mg (100 mg Oral Given 10/08/21 2127)  SUMAtriptan (IMITREX) tablet 50 mg (has no administration in time range)  clonazePAM (KLONOPIN) tablet 0.5 mg (has no administration in time range)  montelukast (SINGULAIR) tablet 10 mg (10 mg Oral Given 10/08/21 2358)  ipratropium-albuterol (DUONEB) 0.5-2.5 (3) MG/3ML nebulizer solution 3 mL (has no administration in time range)  midodrine (PROAMATINE) tablet 10 mg (has no administration in time range)  0.9 %  sodium chloride infusion (has no administration in time range)  sodium chloride 0.9 % bolus 1,000 mL (0 mLs Intravenous Stopped 10/08/21 1708)  sodium chloride 0.9 % bolus 1,000 mL (0 mLs Intravenous Stopped 10/08/21 2018)   meclizine (ANTIVERT) tablet 50 mg (50 mg Oral Given 10/08/21 1838)  LORazepam (ATIVAN) injection 0.5 mg (0.5 mg Intravenous Given 10/08/21 2358)  sodium chloride 0.9 % bolus 500 mL (500 mLs Intravenous New Bag/Given 10/08/21 2346)   IMPRESSION / MDM / ASSESSMENT AND PLAN / ED COURSE  I reviewed the triage vital signs and the nursing notes.                             Differential diagnosis includes, but is not limited to, *** {**The patient is on the cardiac monitor to evaluate for evidence of arrhythmia and/or significant heart rate changes.**} Patient's presentation is most consistent with {EM COPA:27473} {Remember to include, when applicable, any/all of the following data: independent review of imaging independent review of labs (comment specifically on pertinent positives and negatives) review of specific prior hospitalizations, PCP/specialist notes, etc. discuss meds given and prescribed document any discussion with consultants (including hospitalists) any clinical decision tools you used and why (PECARN, NEXUS, etc.) did you consider admitting the patient? document social determinants of health affecting patient's care (homelessness, inability to  follow up in a timely fashion, etc) document any pre-existing conditions increasing risk on current visit (e.g. diabetes and HTN increasing danger of high-risk chest pain/ACS) describes what meds you gave (especially parenteral) and why any other interventions?:1}   FINAL CLINICAL IMPRESSION(S) / ED DIAGNOSES   Final diagnoses:  None   Rx / DC Orders   ED Discharge Orders     None      Note:  This document was prepared using Dragon voice recognition software and may include unintentional dictation errors.

## 2021-10-09 NOTE — ED Provider Notes (Signed)
[facility] Provider Note   Event Date/Time   First MD Initiated Contact with Patient 10/08/21 1701     (approximate) History  Dizziness  HPI Ann Morales is a 46 y.o. female with a past medical history of bipolar disorder who presents for lightheadedness and vertigo that has been worsening over the last few weeks.  Patient states that when she gets up from a seated position she becomes extremely lightheaded. ROS: Patient currently denies any vision changes, tinnitus, difficulty speaking, facial droop, sore throat, chest pain, shortness of breath, abdominal pain, nausea/vomiting/diarrhea, dysuria, or weakness/numbness/paresthesias in any extremity   Physical Exam  Triage Vital Signs: ED Triage Vitals  Enc Vitals Group     BP 10/08/21 1536 (!) 64/51     Pulse Rate 10/08/21 1531 60     Resp 10/08/21 1531 16     Temp 10/08/21 1531 98.5 F (36.9 C)     Temp Source 10/08/21 1531 Oral     SpO2 10/08/21 1531 100 %     Weight 10/08/21 1533 166 lb (75.3 kg)     Height 10/08/21 1533 '5\' 7"'$  (1.702 m)     Head Circumference --      Peak Flow --      Pain Score 10/08/21 1532 0     Pain Loc --      Pain Edu? --      Excl. in [place]? --    Most recent vital signs: Vitals:   10/08/21 2245 10/08/21 2315  BP: (!) 83/47 (!) 84/61  Pulse: (!) 50 (!) 50  Resp:    Temp:    SpO2: 96% 96%   General: Awake, oriented x4. CV:  Good peripheral perfusion.  Resp:  Normal effort.  Abd:  No distention.  Other:  Middle-aged obese Caucasian female laying in bed in no acute distress.  Horizontal nystagmus appreciated ED Results / Procedures / Treatments  Labs (all labs ordered are listed, but only abnormal results are displayed) Labs Reviewed  BASIC METABOLIC PANEL - Abnormal; Notable for the following components:      Result Value   Creatinine, Ser 1.07 (*)    All other components within normal limits  CBC WITH DIFFERENTIAL/PLATELET - Abnormal; Notable for the following  components:   RBC 5.35 (*)    HCT 46.1 (*)    All other components within normal limits  LITHIUM LEVEL - Abnormal; Notable for the following components:   Lithium Lvl 0.25 (*)    All other components within normal limits  TSH  T4, FREE  BASIC METABOLIC PANEL  HIV ANTIBODY (ROUTINE TESTING W REFLEX)  T3, FREE  TROPONIN I (HIGH SENSITIVITY)  TROPONIN I (HIGH SENSITIVITY)   EKG ED ECG REPORT I, Naaman Plummer, the attending physician, personally viewed and interpreted this ECG. Date: 10/09/2021 EKG Time: 1742 Rate: 42 Rhythm: Bradycardic sinus rhythm QRS Axis: normal Intervals: normal ST/T Wave abnormalities: normal Narrative Interpretation: Bradycardic sinus rhythm.  No evidence of acute ischemia RADIOLOGY ED MD interpretation: CT of the head without contrast shows a 1.5 cm soft tissue density extending from the sella into the suprasellar cistern concerning for pituitary adenoma that was similar to MRI done on 07/09/2020 -Agree with radiology assessment Official radiology report(s): CT Head Wo Contrast  Result Date: 10/08/2021 CLINICAL DATA:  Dizziness EXAM: CT HEAD WITHOUT CONTRAST TECHNIQUE: Contiguous axial images were obtained from the base of the skull through the vertex without intravenous contrast. RADIATION DOSE REDUCTION: This exam was performed  according to the departmental dose-optimization program which includes automated exposure control, adjustment of the mA and/or kV according to patient size and/or use of iterative reconstruction technique. COMPARISON:  MR brain done on 07/09/2020 FINDINGS: Brain: No acute intracranial findings are seen. There are no signs of bleeding within the cranium. Ventricles are not dilated. 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 This finding has not changed significantly since previous MRI done on 07/09/2020.  There is thinning of cortical margin in the anterior aspect of sella. Vascular: Unremarkable. Skull: No acute findings are seen. Sinuses/Orbits: Unremarkable. Other: None. IMPRESSION: No acute intracranial findings are seen in noncontrast CT brain. There are no signs of bleeding within the cranium. Ventricles are not dilated. 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 Similar finding was seen in previous MRI done on 07/09/2020. There is thinning of anterior cortical margin of sella, possibly due to remodeling caused by pituitary macro adenoma or possible early extension of the adenoma into the sinus. Electronically Signed   By: Elmer Picker M.D.   On: 10/08/2021 15:55   PROCEDURES: Critical Care performed: No .1-3 Lead EKG Interpretation  Performed by: Naaman Plummer, MD Authorized by: Naaman Plummer, MD     Interpretation: abnormal     ECG rate:  46   ECG rate assessment: bradycardic     Rhythm: sinus bradycardia     Ectopy: none     Conduction: normal    MEDICATIONS ORDERED IN ED: Medications  enoxaparin (LOVENOX) injection 40 mg (40 mg Subcutaneous Given 10/08/21 2127)  acetaminophen (TYLENOL) tablet 650 mg (has no administration in time range)    Or  acetaminophen (TYLENOL) suppository 650 mg (has no administration in time range)  traZODone (DESYREL) tablet 25 mg (has no administration in time range)  magnesium hydroxide (MILK OF MAGNESIA) suspension 30 mL (has no administration in time range)  ondansetron (ZOFRAN) tablet 4 mg (has no administration in time range)    Or  ondansetron (ZOFRAN) injection 4 mg (has no administration in time range)  traZODone (DESYREL) tablet 100 mg (100 mg Oral Given 10/08/21 2127)  SUMAtriptan (IMITREX) tablet 50 mg (has no administration in time range)  clonazePAM (KLONOPIN) tablet 0.5 mg (has no administration in time range)  montelukast  (SINGULAIR) tablet 10 mg (10 mg Oral Given 10/08/21 2358)  ipratropium-albuterol (DUONEB) 0.5-2.5 (3) MG/3ML nebulizer solution 3 mL (has no administration in time range)  midodrine (PROAMATINE) tablet 10 mg (has no administration in time range)  0.9 %  sodium chloride infusion (has no administration in time range)  sodium chloride 0.9 % bolus 1,000 mL (0 mLs Intravenous Stopped 10/08/21 1708)  sodium chloride 0.9 % bolus 1,000 mL (0 mLs Intravenous Stopped 10/08/21 2018)  meclizine (ANTIVERT) tablet 50 mg (50 mg Oral Given 10/08/21 1838)  LORazepam (ATIVAN) injection 0.5 mg (0.5 mg Intravenous Given 10/08/21 2358)  sodium chloride 0.9 % bolus 500 mL (500 mLs Intravenous New Bag/Given 10/08/21 2346)   IMPRESSION / MDM / ASSESSMENT AND PLAN / ED COURSE  I reviewed the triage vital signs and the nursing notes.                             The patient is on the cardiac monitor to  evaluate for evidence of arrhythmia and/or significant heart rate changes. Patient's presentation is most consistent with acute presentation with potential threat to life or bodily function. The patient is suffering from bradycardia without concerning signs of instability on exam such as altered mental status, hypotension, evidence of cardiac end organ dysfunction, or acute heart failure.  Ddx: sick sinus syndrome, vasovagal, unstable heart block (ekg with no signs of Mobitz II, complete heart block), right coronary artery myocardial infarction (neg trop, non STEMI, no chest pain), infection (afebrile, no leukocytosis, no recent illness), hypothyroidism, hyperkalemia, hypoglycemia, dehydration, or intoxication (beta blockade, calcium channel blockade, clonidine, digoxin, opiates, alcohol or other). Workup: CBC, CMP, Trop, EKG, telemetry Results: CBC no evidence of anemia or thrombocytopenia CMP no evidence of renal dysfunction or electrolyte abnormalities Trop Neg x1 EKG interpreted by me and shows sinus bradycardia  Patient  remained symptomatic throughout ED course.   Dispo: Admit to medicine   FINAL CLINICAL IMPRESSION(S) / ED DIAGNOSES   Final diagnoses:  Lightheadedness  Vertigo  Symptomatic bradycardia   Rx / DC Orders   ED Discharge Orders     None      Note:  This document was prepared using Dragon voice recognition software and may include unintentional dictation errors.   Naaman Plummer, MD 10/09/21 [phone]

## 2021-10-10 DIAGNOSIS — R001 Bradycardia, unspecified: Secondary | ICD-10-CM

## 2021-10-10 DIAGNOSIS — D352 Benign neoplasm of pituitary gland: Secondary | ICD-10-CM | POA: Diagnosis not present

## 2021-10-10 DIAGNOSIS — R42 Dizziness and giddiness: Secondary | ICD-10-CM

## 2021-10-10 DIAGNOSIS — I951 Orthostatic hypotension: Secondary | ICD-10-CM | POA: Diagnosis not present

## 2021-10-10 LAB — BASIC METABOLIC PANEL
Anion gap: 2 — ABNORMAL LOW (ref 5–15)
BUN: 7 mg/dL (ref 6–20)
CO2: 23 mmol/L (ref 22–32)
Calcium: 8.4 mg/dL — ABNORMAL LOW (ref 8.9–10.3)
Chloride: 114 mmol/L — ABNORMAL HIGH (ref 98–111)
Creatinine, Ser: 0.76 mg/dL (ref 0.44–1.00)
GFR, Estimated: >60 mL/min (ref >60)
Glucose, Bld: 87 mg/dL (ref 70–99)
Potassium: 3.9 mmol/L (ref 3.5–5.1)
Sodium: 139 mmol/L (ref 135–145)

## 2021-10-10 LAB — T3, FREE: T3, Free: 2.5 pg/mL (ref 2.0–4.4)

## 2021-10-10 LAB — HIV ANTIBODY (ROUTINE TESTING W REFLEX): HIV Screen 4th Generation wRfx: NONREACTIVE

## 2021-10-10 MED ORDER — MIDODRINE HCL 5 MG PO TABS
5.0000 mg | ORAL_TABLET | Freq: Three times a day (TID) | ORAL | Status: DC
  Administered 2021-10-10 – 2021-10-11 (×3): 5 mg via ORAL
  Filled 2021-10-10 (×3): qty 1

## 2021-10-10 MED ORDER — LITHIUM CARBONATE 300 MG PO CAPS
300.0000 mg | ORAL_CAPSULE | Freq: Two times a day (BID) | ORAL | Status: DC
  Administered 2021-10-10 – 2021-10-17 (×15): 300 mg via ORAL
  Filled 2021-10-10 (×15): qty 1

## 2021-10-10 NOTE — Evaluation (Signed)
Physical Therapy Evaluation Patient Details Name: Ann Morales MRN: [id number] DOB: 1975-11-18 Today's Date: 10/10/2021  History of Present Illness  Pt is a 46 y.o. Caucasian female with medical history significant for adult ADHD, bipolar obstructive sleep apnea on home CPAP, GERD, migraine, pituitary tumor and dyslipidemia who presented to the emergency room with acute onset of  dizziness which started about a month ago and has been getting significantly worse over the last 2 weeks with associated vertigo, as well as lightheadedness and occasional presyncope.   Clinical Impression  Patient alert, agreeable to PT and assessment, denied pain. Pt reported dizziness as 3/10 at rest, stated standing up often makes it worse, and more often feels as if she's going to pass out then true rotational dizziness. Pt also endorsed blurry vision, denied any recent head trauma, GI or respiratory infections, as well as denied any changing to hearing, aural fullness.   The patient was able to perform all bed mobility with modI, assistance provided as needed for dix hallpike positioning. Pt reported dizziness with R Marye Round, so Epley maneuver performed with no change in patient status. BP also taken several times in many positions, pt noted for orthostatic hypotension, RN and MD notified. Overall the patient does not appear to have BPPV, but would benefit from further assessment as well as intervention for potential habitation exercise and activity modifications.   Recommendation is outpatient PT.     Recommendations for follow up therapy are one component of a multi-disciplinary discharge planning process, led by the attending physician.  Recommendations may be updated based on patient status, additional functional criteria and insurance authorization.  Follow Up Recommendations Outpatient PT      Assistance Recommended at Discharge Intermittent Supervision/Assistance  Patient can return home with the  following  Assistance with cooking/housework;Assist for transportation;Help with stairs or ramp for entrance    Equipment Recommendations None recommended by PT  Recommendations for Other Services       Functional Status Assessment Patient has had a recent decline in their functional status and demonstrates the ability to make significant improvements in function in a reasonable and predictable amount of time.     Precautions / Restrictions Precautions Precautions: Fall Restrictions Weight Bearing Restrictions: No      Mobility  Bed Mobility Overal bed mobility: Needs Assistance Bed Mobility: Supine to Sit, Sit to Supine, Rolling Rolling: Modified independent (Device/Increase time)   Supine to sit: Modified independent (Device/Increase time) Sit to supine: Modified independent (Device/Increase time)        Transfers Overall transfer level: Modified independent Equipment used: None                    Ambulation/Gait               General Gait Details: deferred low BP  Stairs            Wheelchair Mobility    Modified Rankin (Stroke Patients Only)       Balance Overall balance assessment: Needs assistance Sitting-balance support: Feet supported Sitting balance-Leahy Scale: Good       Standing balance-Leahy Scale: Fair                               Pertinent Vitals/Pain Pain Assessment Pain Assessment: No/denies pain    Home Living Family/patient expects to be discharged to:: Private residence Living Arrangements: Spouse/significant other Available Help at Discharge: Family;Available PRN/intermittently Type of  Home: Apartment         Home Layout: One level Home Equipment: None      Prior Function Prior Level of Function : Independent/Modified Independent;Driving                     Hand Dominance   Dominant Hand: Right    Extremity/Trunk Assessment   Upper Extremity Assessment Upper Extremity  Assessment: Overall WFL for tasks assessed    Lower Extremity Assessment Lower Extremity Assessment: Overall WFL for tasks assessed    Cervical / Trunk Assessment Cervical / Trunk Assessment: Normal  Communication   Communication: No difficulties  Cognition Arousal/Alertness: Awake/alert Behavior During Therapy: WFL for tasks assessed/performed Overall Cognitive Status: Within Functional Limits for tasks assessed                                          General Comments      Exercises Other Exercises Other Exercises: supine head roll test negative bilaterally, pt reported symptoms with R dix hallpike, no nystagmus noted. L dix hallpike negative   Assessment/Plan    PT Assessment Patient needs continued PT services  PT Problem List         PT Treatment Interventions DME instruction;Therapeutic exercise;Gait training;Balance training;Stair training;Neuromuscular re-education;Functional mobility training;Therapeutic activities;Patient/family education    PT Goals (Current goals can be found in the Care Plan section)  Acute Rehab PT Goals Patient Stated Goal: to stop being dizzy PT Goal Formulation: With patient Time For Goal Achievement: 10/24/21 Potential to Achieve Goals: Fair    Frequency Min 2X/week     Co-evaluation               AM-PAC PT "6 Clicks" Mobility  Outcome Measure Help needed turning from your back to your side while in a flat bed without using bedrails?: None Help needed moving from lying on your back to sitting on the side of a flat bed without using bedrails?: None Help needed moving to and from a bed to a chair (including a wheelchair)?: None Help needed standing up from a chair using your arms (e.g., wheelchair or bedside chair)?: None Help needed to walk in hospital room?: A Little Help needed climbing 3-5 steps with a railing? : A Little 6 Click Score: 22    End of Session   Activity Tolerance: Patient tolerated  treatment well;Other (comment) (limited due to low BP) Patient left: in bed;with call bell/phone within reach;with bed alarm set Nurse Communication: Mobility status PT Visit Diagnosis: Other abnormalities of gait and mobility (R26.89)    Time: 1740-8144 PT Time Calculation (min) (ACUTE ONLY): 39 min   Charges:   PT Evaluation $PT Eval Low Complexity: 1 Low PT Treatments $Therapeutic Activity: 23-37 mins $Canalith Rep Proc: 8-22 mins       Lieutenant Diego PT, DPT 12:46 PM,10/10/21

## 2021-10-10 NOTE — Progress Notes (Signed)
[facility] Progress Note  Patient: Ann Morales     LTJ:[id number]  DOA: 10/08/2021   PCP: Gwyneth Sprout, FNP       Brief hospital course: This is a 46 year old female with bipolar disorder, asthma, obstructive sleep apnea may, sellar/suprasellar mass, migraines, dyslipidemia who presents to the hospital for dizziness.  He states the dizziness has been present for about 1 month. She tells me she has not seen her PCP in about 1 to 2 years however, there is a note in the chart from her PCP who evaluated her on 12/22 for "dizziness" and diagnosed her with vertigo.  I have discussed this with the patient.  An MRI was performed in the ED and is essentially unchanged regarding the sellar/suprasellar mass.  The patient was given 2-1/2 L of IV fluids and boluses and then started on 200 cc/h of normal saline along with midodrine. She was also noted to have a heart rate in the 40s and otherwise normal vital signs.  In regards to her psych medications, she was off her lithium and restarted about 3 weeks ago.  She continues to receive Invega injections monthly and last had one about 2 weeks ago.  She did run out of Klonopin and did not resume this  Subjective:  She continues to feel dizzy.  Assessment and Plan: Principal Problem:   Orthostatic hypotension Sellar and suprasellar mass -Blood pressures in the 80s at laying and drops to 60s with standing.  The patient admits to feeling dizzy Despite adequate hydration, and TED stockings she continues to be quite orthostatic with symptoms of lightheadedness which she refers to as dizziness -Resume midodrine - [name] can also cause orthostatic hypotension -Due to pituitary mass, will do an endocrine work-up with 24-hour urine cortisol, ACTH level, prolactin level, IGF-1 - note from 10/16/20> neurosurgeon, Dr. Lacinda Axon at [facility] recommended a full ophthalmological eval and surgical resection which she agreed with at the time-subsequent telephone  note from 12/22 mentions that the patient canceled the surgery due to financial limitations and difficulty traveling back and forth to the  Active Problems:   Vertigo- -Apparently was diagnosed in December with BPPV - I do notice a horizontal nystagmus when she looks to the right -Vestibular evaluation performed today is not suggestive of BPPV  Hypokalemia - Replaced    Bipolar disorder ([place]) -She takes [place] daily and IM -Lithium level is low despite taking morphine - Have asked psychiatry to evaluate her to help decide which medications to resume -There are multiple other medications listed on her list which she states she is not taking.  These include Adderall, Wellbutrin, Latuda, Vyvanse,  Trintellix and Topamax    Obstructive sleep apnea -Continue CPAP    Asthma, chronic -No wheezing or dyspnea  DVT prophylaxis:  Place TED hose Start: 10/09/21 1312 enoxaparin (LOVENOX) injection 40 mg Start: 10/08/21 1930     Code Status: Full Code  Consultants: Psychiatry, cardiology Level of Care: Level of care: Telemetry Cardiac Disposition Plan:  Status is: Inpatient due to ongoing orthostatic symptoms and work up  Objective:   Vitals:   10/10/21 0338 10/10/21 0813 10/10/21 1240 10/10/21 1601  BP: 114/81 108/65 112/77 113/65  Pulse: (!) 45 (!) 50 (!) 48 (!) 53  Resp: '18 19 16 16  '$ Temp: 98.5 F (36.9 C) 98.2 F (36.8 C) 98.4 F (36.9 C) 98.2 F (36.8 C)  TempSrc: Oral Oral Oral Oral  SpO2: 99% 99% 99% 99%  Weight:      Height:  Filed Weights   10/08/21 1533  Weight: 75.3 kg   Exam: General exam: Appears comfortable  HEENT: PERRLA, oral mucosa moist, no sclera icterus or thrush Respiratory system: Clear to auscultation. Respiratory effort normal. Cardiovascular system: S1 & S2 heard, regular rate and rhythm Gastrointestinal system: Abdomen soft, non-tender, nondistended. Normal bowel sounds   Central nervous system: Alert and oriented. No focal neurological  deficits. Extremities: No cyanosis, clubbing or edema Skin: No rashes or ulcers Psychiatry:  Mood & affect appropriate.    Imaging and lab data was personally reviewed    CBC: Recent Labs  Lab 10/08/21 1535  WBC 9.5  NEUTROABS 6.4  HGB 14.9  HCT 46.1*  MCV 86.2  PLT 898    Basic Metabolic Panel: Recent Labs  Lab 10/08/21 1535 10/09/21 0604 10/10/21 0745  NA 139 140 139  K 3.8 3.4* 3.9  CL 104 110 114*  CO2 '29 24 23  '$ GLUCOSE 94 96 87  BUN '13 10 7  '$ CREATININE 1.07* 0.91 0.76  CALCIUM 9.3 7.7* 8.4*    GFR: Estimated Creatinine Clearance: 93.1 mL/min (by C-G formula based on SCr of 0.76 mg/dL).  Scheduled Meds:  enoxaparin (LOVENOX) injection  40 mg Subcutaneous Q24H   lithium carbonate  300 mg Oral BID   midodrine  5 mg Oral TID WC   montelukast  10 mg Oral QHS   Continuous Infusions:  sodium chloride 100 mL/hr at 10/10/21 1210     LOS: 1 day   Author: Debbe Odea  10/10/2021 4:38 PM

## 2021-10-11 DIAGNOSIS — I951 Orthostatic hypotension: Secondary | ICD-10-CM | POA: Diagnosis not present

## 2021-10-11 DIAGNOSIS — R42 Dizziness and giddiness: Secondary | ICD-10-CM | POA: Diagnosis not present

## 2021-10-11 DIAGNOSIS — R001 Bradycardia, unspecified: Secondary | ICD-10-CM | POA: Diagnosis not present

## 2021-10-11 DIAGNOSIS — D352 Benign neoplasm of pituitary gland: Secondary | ICD-10-CM | POA: Diagnosis not present

## 2021-10-11 LAB — PROLACTIN: Prolactin: 83 ng/mL — ABNORMAL HIGH (ref 4.8–23.3)

## 2021-10-11 LAB — INSULIN-LIKE GROWTH FACTOR: Somatomedin C: 51 ng/mL — ABNORMAL LOW (ref 70–225)

## 2021-10-11 MED ORDER — MIDODRINE HCL 5 MG PO TABS
10.0000 mg | ORAL_TABLET | Freq: Three times a day (TID) | ORAL | Status: DC
  Administered 2021-10-11 – 2021-10-13 (×6): 10 mg via ORAL
  Filled 2021-10-11 (×6): qty 2

## 2021-10-11 NOTE — Consult Note (Signed)
[facility] Consult   Reason for Consult: Consult for 46 year old woman currently in the hospital with orthostatic hypotension and dizziness.  Question of the consult is whether her antipsychotic could be responsible Referring Physician: Rizwan Patient Identification: Ann Morales MRN:  [id number] Principal Diagnosis: Orthostatic hypotension Diagnosis:  Principal Problem:   Orthostatic hypotension Active Problems:   Obstructive sleep apnea   Bipolar disorder (HCC)   Asthma, chronic   Pituitary macroadenoma ([place])   Vertigo   Total Time spent with patient: 45 minutes  Subjective:   Ann Morales is a 46 y.o. female patient admitted with "I am having a bad day".  HPI: Patient seen and chart reviewed.  46 year old woman with chronic bipolar disorder who is admitted to the hospital with dizziness.  Patient tells me the dizziness has been going on for about a month.  It occurs uniquely when she is getting up from a recumbent position to sitting up and then standing up.  She says it will feel like she has foggy vision and that her legs go out from under her.  She claims it takes up to 10 minutes before it resolves.  Patient says she is not having any of these symptoms if she stays recumbent in bed without trying to get up.  Blood pressure here in the hospital is running low in general but it does drop significantly when she sits up and stands up.  As far as her mood she says she has been feeling depressed for a few months.  She had tried going off of all of her psychiatric medicines for about 10 months and had found that her depression recurred.  She sees an outpatient psychiatrist Dr. Dorann Ou and that doctor has been restarting her medicines one of the time.  She has restarted her lithium at low dose although the level when she presented to the hospital was very low.  She also restarted her Invega long-acting injectable shot a few weeks ago.  Patient reports that in the past  she had never noticed the Invega shot being accompanied by similar orthostatic symptoms.  She denies any suicidal thoughts or psychotic symptoms  Past Psychiatric History: Past history of longstanding bipolar disorder.  Used to be seen outpatient in our clinic until a few years ago now seen at [facility].  Has been stable in the past on lithium and other medications.  Stopped medicine for several months and got symptomatic now resuming them.  No past suicide attempts reported  Risk to Self:   Risk to Others:   Prior Inpatient Therapy:   Prior Outpatient Therapy:    Past Medical History:  Past Medical History:  Diagnosis Date   ADHD (attention deficit hyperactivity disorder)    Anxiety    Arthritis    Asthma    well controlled   Bipolar disorder ([place])    COVID-19 03/29/2020   Depression    GERD (gastroesophageal reflux disease)    Headache    migraines   History of kidney stones    currently as of 05-30-19   Sleep apnea    cpap   Wears dentures    partial upper    Past Surgical History:  Procedure Laterality Date   BACK SURGERY  03/2017   cervical fusion   CHOLECYSTECTOMY     ETHMOIDECTOMY Right 06/12/2020   Procedure: TOTAL ETHMOIDECTOMY;  Surgeon: Clyde Canterbury, MD;  Location: [place];  Service: ENT;  Laterality: Right;   FRONTAL SINUS EXPLORATION Right 06/12/2020  Procedure: FRONTAL SINUS EXPLORATION;  Surgeon: Clyde Canterbury, MD;  Location: [place];  Service: ENT;  Laterality: Right;   GALLBLADDER SURGERY     HYSTEROSCOPY WITH NOVASURE N/A 06/02/2019   Procedure: [place];  Surgeon: Malachy Mood, MD;  Location: [facility];  Service: Gynecology;  Laterality: N/A;   IMAGE GUIDED SINUS SURGERY N/A 06/12/2020   Procedure: IMAGE GUIDED SINUS SURGERY;  Surgeon: Clyde Canterbury, MD;  Location: [place];  Service: ENT;  Laterality: N/A;  PLACED DISK ON OR CHARGE NURSE DESK 3-25 KP   LAPAROSCOPY N/A 12/21/2018    Procedure: LAPAROSCOPY DIAGNOSTIC;  Surgeon: Will Bonnet, MD;  Location: [facility];  Service: Gynecology;  Laterality: N/A;   MAXILLARY ANTROSTOMY Right 06/12/2020   Procedure: MAXILLARY ANTROSTOMY;  Surgeon: Clyde Canterbury, MD;  Location: [place];  Service: ENT;  Laterality: Right;  Covid (+) 03/29/20 sleep apnea   PLANTAR FASCIA RELEASE Left 06/12/2017   Procedure: ENDOSCOPIC PLANTAR FASCIOTOMY-RELEASE;  Surgeon: Samara Deist, DPM;  Location: [facility];  Service: Podiatry;  Laterality: Left;   REPAIR EXTENSOR TENDON Left 06/12/2017   Procedure: REPAIR FLEXOR TENDON;  Surgeon: Samara Deist, DPM;  Location: [facility];  Service: Podiatry;  Laterality: Left;   TARSAL TUNNEL RELEASE Left 06/12/2017   Procedure: TARSAL TUNNEL RELEASE-BAXTER RELEASE;  Surgeon: Samara Deist, DPM;  Location: [facility];  Service: Podiatry;  Laterality: Left;   TUBAL LIGATION     Family History:  Family History  Problem Relation Age of Onset   Hypercholesterolemia Father    Bipolar disorder Mother    Hypercholesterolemia Maternal Grandmother    Hypertension Maternal Grandmother    Anxiety disorder Maternal Grandmother    Depression Maternal Grandmother    Ovarian cancer Maternal Grandmother    Colon cancer Maternal Grandfather 61   Ovarian cancer Paternal Grandmother    Family Psychiatric  History: See previous Social History:  Social History   Substance and Sexual Activity  Alcohol Use No   Alcohol/week: 0.0 standard drinks of alcohol     Social History   Substance and Sexual Activity  Drug Use No    Social History   Socioeconomic History   Marital status: Single    Spouse name: Not on file   Number of children: Not on file   Years of education: Not on file   Highest education level: Not on file  Occupational History   Not on file  Tobacco Use   Smoking status: Every Day    Packs/day: 2.00    Years: 15.00    Total pack years: 30.00    Types: Cigarettes    Start date: 09/25/1994    Smokeless tobacco: Never  Vaping Use   Vaping Use: Never used  Substance and Sexual Activity   Alcohol use: No    Alcohol/week: 0.0 standard drinks of alcohol   Drug use: No   Sexual activity: Yes    Birth control/protection: Surgical  Other Topics Concern   Not on file  Social History Narrative   Not on file   Social Determinants of Health   Financial Resource Strain: Not on file  Food Insecurity: Not on file  Transportation Needs: Not on file  Physical Activity: Not on file  Stress: Not on file  Social Connections: Not on file   Additional Social History:    Allergies:   Allergies  Allergen Reactions   Codeine Hives and Shortness Of Breath   Azithromycin Diarrhea   Calamine-Zinc Oxide Itching  Doxycycline Nausea And Vomiting   Sulfa Antibiotics Other (See Comments)    Severe abdominal pain.   Zinc Other (See Comments)    Burning sensation    Labs:  Results for orders placed or performed during the hospital encounter of 10/08/21 (from the past 48 hour(s))  HIV Antibody (routine testing w rflx)     Status: None   Collection Time: 10/10/21  6:12 AM  Result Value Ref Range   HIV Screen 4th Generation wRfx Non Reactive Non Reactive    Comment: Performed at [facility], [place] [address], [place], [place] [address]  Basic metabolic panel     Status: Abnormal   Collection Time: 10/10/21  7:45 AM  Result Value Ref Range   Sodium 139 135 - 145 mmol/L    Comment: ELECTROLYTES REPEATED TO VERIFY DAS   Potassium 3.9 3.5 - 5.1 mmol/L   Chloride 114 (H) 98 - 111 mmol/L   CO2 23 22 - 32 mmol/L   Glucose, Bld 87 70 - 99 mg/dL    Comment: Glucose reference range applies only to samples taken after fasting for at least 8 hours.   BUN 7 6 - 20 mg/dL   Creatinine, Ser 0.76 0.44 - 1.00 mg/dL   Calcium 8.4 (L) 8.9 - 10.3 mg/dL   GFR, Estimated >60 >60 mL/min    Comment: (NOTE) Calculated using the CKD-EPI Creatinine Equation (2021)    Anion gap 2 (L) 5 - 15     Comment: Performed at [facility], [address] [place],  [address]  Prolactin     Status: Abnormal   Collection Time: 10/10/21 10:19 AM  Result Value Ref Range   Prolactin 83.0 (H) 4.8 - 23.3 ng/mL    Comment: (NOTE) Performed At: [facility] [place], [place] [id number] Rush Farmer MD [id number]   Insulin-like growth factor     Status: Abnormal   Collection Time: 10/10/21 10:19 AM  Result Value Ref Range   Somatomedin C 51 (L) 70 - 225 ng/mL    Comment: (NOTE) Performed At: [facility] [place], [place] [id number] Rush Farmer MD [id number]     Current Facility-Administered Medications  Medication Dose Route Frequency Provider Last Rate Last Admin   acetaminophen (TYLENOL) tablet 650 mg  650 mg Oral Q6H PRN [place], Jan A, MD   650 mg at 10/11/21 0038   Or   acetaminophen (TYLENOL) suppository 650 mg  650 mg Rectal Q6H PRN [place]y, Jan A, MD       enoxaparin (LOVENOX) injection 40 mg  40 mg Subcutaneous Q24H [place]y, Jan A, MD   40 mg at 10/10/21 2112   ipratropium-albuterol (DUONEB) 0.5-2.5 (3) MG/3ML nebulizer solution 3 mL  3 mL Nebulization QID PRN [place]y, Jan A, MD       lithium carbonate capsule 300 mg  300 mg Oral BID Debbe Odea, MD   300 mg at 10/11/21 1007   magnesium hydroxide (MILK OF MAGNESIA) suspension 30 mL  30 mL Oral Daily PRN [place]y, Jan A, MD       midodrine (PROAMATINE) tablet 10 mg  10 mg Oral TID WC Rizwan, Saima, MD   10 mg at 10/11/21 1316   montelukast (SINGULAIR) tablet 10 mg  10 mg Oral QHS [place]y, Jan A, MD   10 mg at 10/10/21 2115   ondansetron (ZOFRAN) tablet 4 mg  4 mg Oral Q6H PRN [place]y, Arvella Merles, MD       Or  ondansetron (ZOFRAN) injection 4 mg  4 mg Intravenous Q6H PRN [place]y, Jan A, MD       SUMAtriptan (IMITREX) tablet 50 mg  50 mg Oral Q2H PRN [place]y, Jan A, MD   50 mg at 10/10/21 2113    Musculoskeletal: Strength & Muscle Tone: within normal limits Gait & Station:  Did  not attempt to get her up Patient leans: N/A            Psychiatric Specialty Exam:  Presentation  General Appearance: No data recorded Eye Contact:No data recorded Speech:No data recorded Speech Volume:No data recorded Handedness:No data recorded  Mood and Affect  Mood:No data recorded Affect:No data recorded  Thought Process  Thought Processes:No data recorded Descriptions of Associations:No data recorded Orientation:No data recorded Thought Content:No data recorded History of Schizophrenia/Schizoaffective disorder:No data recorded Duration of Psychotic Symptoms:No data recorded Hallucinations:No data recorded Ideas of Reference:No data recorded Suicidal Thoughts:No data recorded Homicidal Thoughts:No data recorded  Sensorium  Memory:No data recorded Judgment:No data recorded Insight:No data recorded  Executive Functions  Concentration:No data recorded Attention Span:No data recorded Recall:No data recorded Fund of Knowledge:No data recorded Language:No data recorded  Psychomotor Activity  Psychomotor Activity:No data recorded  Assets  Assets:No data recorded  Sleep  Sleep:No data recorded  Physical Exam: Physical Exam Vitals and nursing note reviewed.  Constitutional:      Appearance: Normal appearance.  HENT:     Head: Normocephalic and atraumatic.     Mouth/Throat:     Pharynx: Oropharynx is clear.  Eyes:     Pupils: Pupils are equal, round, and reactive to light.  Cardiovascular:     Rate and Rhythm: Normal rate and regular rhythm.  Pulmonary:     Effort: Pulmonary effort is normal.     Breath sounds: Normal breath sounds.  Abdominal:     General: Abdomen is flat.     Palpations: Abdomen is soft.  Musculoskeletal:        General: Normal range of motion.  Skin:    General: Skin is warm and dry.  Neurological:     General: No focal deficit present.     Mental Status: She is alert. Mental status is at baseline.  Psychiatric:         Attention and Perception: Attention normal.        Mood and Affect: Mood is depressed.        Speech: Speech normal.        Behavior: Behavior is cooperative.        Thought Content: Thought content normal. Thought content does not include suicidal ideation.        Cognition and Memory: Cognition normal.    Review of Systems  Constitutional: Negative.   HENT: Negative.    Eyes: Negative.   Respiratory: Negative.    Cardiovascular: Negative.   Gastrointestinal: Negative.   Musculoskeletal: Negative.   Skin: Negative.   Neurological: Negative.   Psychiatric/Behavioral:  Positive for depression. Negative for substance abuse and suicidal ideas.    Blood pressure 121/72, pulse (!) 46, temperature 98.6 F (37 C), resp. rate 17, height '5\' 7"'$  (1.702 m), weight 75.3 kg, SpO2 100 %. Body mass index is 26 kg/m.  Treatment Plan Summary: Plan it is certainly true that orthostatic hypotension is a potential side effect of Invega and its long-acting injectable.  The patient reports that she had never had this as a side effect previously but that does not rule out that it could be at least partially  involved in the current symptoms.  Unfortunately it is not possible to do anything about that now since she had the shot about 2 weeks ago and it is usually designed to last about 4 weeks.  Depending on the outcome of other tests and interventions it would be possible that she could report back to her psychiatrist and not get the Invega shot as a follow-up and see whether something else could be done and avoid the orthostasis.  In the meantime I do not think there would be any reason to add any medication which just might worsen the condition.  Disposition: No evidence of imminent risk to self or others at present.   Patient does not meet criteria for psychiatric inpatient admission. Discussed crisis plan, support from social network, calling 911, coming to the Emergency Department, and calling Suicide  Hotline.  Alethia Berthold, MD 10/11/2021 6:10 PM

## 2021-10-11 NOTE — Progress Notes (Signed)
Physical Therapy Treatment Patient Details Name: Ann Morales MRN: [id number] DOB: Nov 24, 1975 Today's Date: 10/11/2021   History of Present Illness Pt is a 46 y.o. Caucasian female with medical history significant for adult ADHD, bipolar obstructive sleep apnea on home CPAP, GERD, migraine, pituitary tumor and dyslipidemia who presented to the emergency room with acute onset of  dizziness which started about a month ago and has been getting significantly worse over the last 2 weeks with associated vertigo, as well as lightheadedness and occasional presyncope.    PT Comments    The pt is presenting with orthostatic hypotension with symptoms from sit<>stand that do not get better with time. The pt is able to complete ADL activity without LOB and with supervision. She was educated on gaze stabilization habituation exercise as apart of VRT. PT will continue to follow.    Recommendations for follow up therapy are one component of a multi-disciplinary discharge planning process, led by the attending physician.  Recommendations may be updated based on patient status, additional functional criteria and insurance authorization.  Follow Up Recommendations  Outpatient PT     Assistance Recommended at Discharge Intermittent Supervision/Assistance  Patient can return home with the following Assistance with cooking/housework;Assist for transportation;Help with stairs or ramp for entrance   Equipment Recommendations  None recommended by PT    Recommendations for Other Services       Precautions / Restrictions Precautions Precautions: Fall Restrictions Weight Bearing Restrictions: No     Mobility  Bed Mobility Overal bed mobility: Independent Bed Mobility: Supine to Sit Rolling: Independent   Supine to sit: Independent Sit to supine: Independent        Transfers Overall transfer level: Independent Equipment used: None                    Ambulation/Gait Ambulation/Gait  assistance: Supervision Gait Distance (Feet): 40 Feet Assistive device: None Gait Pattern/deviations: WFL(Within Functional Limits)           Stairs             Wheelchair Mobility    Modified Rankin (Stroke Patients Only)       Balance     Sitting balance-Leahy Scale: Good       Standing balance-Leahy Scale: Good                              Cognition Arousal/Alertness: Awake/alert Behavior During Therapy: WFL for tasks assessed/performed Overall Cognitive Status: Within Functional Limits for tasks assessed                                          Exercises Other Exercises Other Exercises: Habituation exercise: gaze stabilization taught to the patient with min-mod symptoms occuring. Pt educated to complete exercise x30s every hour and PT will continue to progress as POC completed.    General Comments        Pertinent Vitals/Pain Pain Assessment Pain Assessment: No/denies pain    Home Living                          Prior Function            PT Goals (current goals can now be found in the care plan section) Acute Rehab PT Goals Patient Stated Goal: to stop being dizzy PT  Goal Formulation: With patient Time For Goal Achievement: 10/24/21 Potential to Achieve Goals: Fair Progress towards PT goals: Progressing toward goals    Frequency    Min 2X/week      PT Plan Current plan remains appropriate    Co-evaluation              AM-PAC PT "6 Clicks" Mobility   Outcome Measure  Help needed turning from your back to your side while in a flat bed without using bedrails?: None Help needed moving from lying on your back to sitting on the side of a flat bed without using bedrails?: None Help needed moving to and from a bed to a chair (including a wheelchair)?: None Help needed standing up from a chair using your arms (e.g., wheelchair or bedside chair)?: None Help needed to walk in hospital room?:  A Little Help needed climbing 3-5 steps with a railing? : A Little 6 Click Score: 22    End of Session   Activity Tolerance: Patient tolerated treatment well Patient left: in bed;with call bell/phone within reach Nurse Communication: Mobility status PT Visit Diagnosis: Other abnormalities of gait and mobility (R26.89)     Time: 7591-6384 PT Time Calculation (min) (ACUTE ONLY): 23 min  Charges:  $Therapeutic Exercise: 23-37 mins                     11:41 AM, 10/11/21 Kindrick Lankford A. Saverio Danker PT, DPT Physical Therapist - [facility]    Tabby Beaston A Temitope Griffing 10/11/2021, 11:39 AM

## 2021-10-11 NOTE — Progress Notes (Addendum)
[facility] Progress Note  Patient: Ann Morales     AYT:[id number]  DOA: 10/08/2021   PCP: Gwyneth Sprout, FNP       Brief hospital course: This is a 46 year old female with bipolar disorder, asthma, obstructive sleep apnea may, sellar/suprasellar mass, migraines, dyslipidemia who presents to the hospital for dizziness.  He states the dizziness has been present for about 1 month. She tells me she has not seen her PCP in about 1 to 2 years however, there is a note in the chart from her PCP who evaluated her on 12/22 for "dizziness" and diagnosed her with vertigo.  I have discussed this with the patient.  An MRI was performed in the ED and is essentially unchanged regarding the sellar/suprasellar mass.  The patient was given 2-1/2 L of IV fluids and boluses and then started on 200 cc/h of normal saline along with midodrine. She was also noted to have a heart rate in the 40s and otherwise normal vital signs.  In regards to her psych medications, she was off her lithium and restarted about 3 weeks ago.  She continues to receive Invega injections monthly and last had one about 2 weeks ago.  She did run out of Klonopin and did not resume this  Subjective:  She still feels dizzy.   Assessment and Plan: Principal Problem:   Orthostatic hypotension Sellar and suprasellar mass - The patient admits to feeling dizzy Despite adequate hydration, and TED stockings she continues to be quite orthostatic with symptoms of lightheadedness which she refers to as dizziness -bP increasing after starting Midodrine but still orthostatic today (117/74> 88/75)-  Increase Midodrine today to 10 mg  - Invega can also cause orthostatic hypotension -Due to pituitary mass, will do an endocrine work-up with 24-hour urine cortisol, ACTH level, prolactin level, IGF-1 - Prolactin elevated at 83.0 - TFTs normal - 24 hr urine collection for cortisol not started yet- messaged RN about this - note from 10/16/20>  neurosurgeon, Dr. Lacinda Axon at [facility] recommended a full ophthalmological eval and surgical resection which she agreed with at the time-subsequent telephone note from 12/22 mentions that the patient canceled the surgery due to financial limitations and difficulty traveling back and forth  - I discussed her following up with neurosurgery. She began to cry and did not want to discuss it further.   Active Problems:   Vertigo- -Apparently was diagnosed in December with BPPV - I do notice a horizontal nystagmus when she looks to the right -Vestibular evaluation performed in the hospital s not suggestive of BPPV  Hypokalemia - Replaced  Sinus bradycardia - per cardiology PA, no further work up is needed - BP has been low normal    Bipolar disorder ([place]) -She takes [place] daily and IM -Lithium level is low despite taking morphine - Have asked psychiatry to evaluate her to help decide which medications to resume- still waiting on eval -There are multiple other medications listed on her list which she states she is not taking.  These include Adderall, Wellbutrin, Latuda, Vyvanse,  Trintellix and Topamax    Obstructive sleep apnea -Continue CPAP    Asthma, chronic -No wheezing or dyspnea  DVT prophylaxis:  Place TED hose Start: 10/09/21 1312 enoxaparin (LOVENOX) injection 40 mg Start: 10/08/21 1930     Code Status: Full Code  Consultants: Psychiatry, cardiology Level of Care: Level of care: Telemetry Cardiac Disposition Plan:  Status is: Inpatient due to ongoing orthostatic symptoms and work up  Objective:  Vitals:   10/11/21 1132 10/11/21 1151 10/11/21 1200 10/11/21 1532  BP:  95/64  121/72  Pulse:  (!) 51  (!) 46  Resp:  '17 11 17  '$ Temp:  98.7 F (37.1 C)  98.6 F (37 C)  TempSrc:      SpO2: 99% 97%  100%  Weight:      Height:       Filed Weights   10/08/21 1533  Weight: 75.3 kg   Exam: General exam: Appears comfortable  HEENT: PERRLA, oral mucosa moist, no sclera icterus  or thrush Respiratory system: Clear to auscultation. Respiratory effort normal. Cardiovascular system: S1 & S2 heard, regular rate and rhythm Gastrointestinal system: Abdomen soft, non-tender, nondistended. Normal bowel sounds   Central nervous system: Alert and oriented. No focal neurological deficits.- mild horizontal nystagmus upon looking to the right Extremities: No cyanosis, clubbing or edema Skin: No rashes or ulcers Psychiatry:  Mood & affect appropriate.     Imaging and lab data was personally reviewed    CBC: Recent Labs  Lab 10/08/21 1535  WBC 9.5  NEUTROABS 6.4  HGB 14.9  HCT 46.1*  MCV 86.2  PLT 384    Basic Metabolic Panel: Recent Labs  Lab 10/08/21 1535 10/09/21 0604 10/10/21 0745  NA 139 140 139  K 3.8 3.4* 3.9  CL 104 110 114*  CO2 '29 24 23  '$ GLUCOSE 94 96 87  BUN '13 10 7  '$ CREATININE 1.07* 0.91 0.76  CALCIUM 9.3 7.7* 8.4*    GFR: Estimated Creatinine Clearance: 93.1 mL/min (by C-G formula based on SCr of 0.76 mg/dL).  Scheduled Meds:  enoxaparin (LOVENOX) injection  40 mg Subcutaneous Q24H   lithium carbonate  300 mg Oral BID   midodrine  10 mg Oral TID WC   montelukast  10 mg Oral QHS   Continuous Infusions:     LOS: 2 days   Author: Debbe Odea  10/11/2021 4:25 PM

## 2021-10-12 DIAGNOSIS — D352 Benign neoplasm of pituitary gland: Secondary | ICD-10-CM | POA: Diagnosis not present

## 2021-10-12 DIAGNOSIS — I951 Orthostatic hypotension: Secondary | ICD-10-CM | POA: Diagnosis not present

## 2021-10-12 DIAGNOSIS — R42 Dizziness and giddiness: Secondary | ICD-10-CM | POA: Diagnosis not present

## 2021-10-12 DIAGNOSIS — R001 Bradycardia, unspecified: Secondary | ICD-10-CM | POA: Diagnosis not present

## 2021-10-12 LAB — ACTH: C206 ACTH: 18.8 pg/mL (ref 7.2–63.3)

## 2021-10-12 LAB — CBC
HCT: 42.1 % (ref 36.0–46.0)
Hemoglobin: 13.6 g/dL (ref 12.0–15.0)
MCH: 27.7 pg (ref 26.0–34.0)
MCHC: 32.3 g/dL (ref 30.0–36.0)
MCV: 85.7 fL (ref 80.0–100.0)
Platelets: 182 10*3/uL (ref 150–400)
RBC: 4.91 MIL/uL (ref 3.87–5.11)
RDW: 12.7 % (ref 11.5–15.5)
WBC: 8.9 10*3/uL (ref 4.0–10.5)
nRBC: 0 % (ref 0.0–0.2)

## 2021-10-12 LAB — BASIC METABOLIC PANEL
Anion gap: 5 (ref 5–15)
BUN: 13 mg/dL (ref 6–20)
CO2: 31 mmol/L (ref 22–32)
Calcium: 8.8 mg/dL — ABNORMAL LOW (ref 8.9–10.3)
Chloride: 105 mmol/L (ref 98–111)
Creatinine, Ser: 1.03 mg/dL — ABNORMAL HIGH (ref 0.44–1.00)
GFR, Estimated: >60 mL/min (ref >60)
Glucose, Bld: 99 mg/dL (ref 70–99)
Potassium: 3.7 mmol/L (ref 3.5–5.1)
Sodium: 141 mmol/L (ref 135–145)

## 2021-10-12 LAB — CORTISOL-AM, BLOOD: Cortisol - AM: 7.3 ug/dL (ref 6.7–22.6)

## 2021-10-12 NOTE — Progress Notes (Addendum)
[facility] Progress Note  Patient: Ann Morales     QQP:[id number]  DOA: 10/08/2021   PCP: Gwyneth Sprout, FNP       Brief hospital course: This is a 46 year old female with bipolar disorder, asthma, obstructive sleep apnea may, sellar/suprasellar mass, migraines, dyslipidemia who presents to the hospital for dizziness.  He states the dizziness has been present for about 1 month. She tells me she has not seen her PCP in about 1 to 2 years however, there is a note in the chart from her PCP who evaluated her on 12/22 for "dizziness" and diagnosed her with vertigo.  I have discussed this with the patient.  An MRI was performed in the ED and is essentially unchanged regarding the sellar/suprasellar mass.  The patient was given 2-1/2 L of IV fluids and boluses and then started on 200 cc/h of normal saline along with midodrine. She was also noted to have a heart rate in the 40s and otherwise normal vital signs.  In regards to her psych medications, she was off her lithium and restarted about 3 weeks ago.  She continues to receive Invega injections monthly and last had one about 2 weeks ago.  She did run out of Klonopin and did not resume this  Subjective:  Dizziness improved by yesterday evening but has returned this AM. She did not receive her Midodrine prior to checking orthostatics today.   Assessment and Plan: Principal Problem:   Orthostatic hypotension - The patient admits to feeling dizzy Despite adequate hydration, and TED stockings she continues to be quite orthostatic with symptoms of lightheadedness which she refers to as dizziness - appreciate psych eval-  Dr Weber Cooks does not feel [name] is the cause  - despite Increasing Midodrine to 10 mg TID, orthostatics now similar to what they were when she was first admitted 88/65> 52/41- HR increases from 63 to 93 - will give fluid boluses- start with 500 cc NS once and then chest orthostatics again- check ECHO  Active  Problems:  Sellar and suprasellar mass -  24-hour urine cortisol collected and results pending -  ACTH level normal   - Prolactin elevated at 83.0 - TFTs normal - note from 10/16/20> neurosurgeon, Dr. Lacinda Axon at [facility] recommended a full ophthalmological eval and surgical resection which she agreed with at the time-subsequent telephone note from 12/22 mentions that the patient canceled the surgery due to financial limitations and difficulty traveling back and forth  -- appreciate NS consult- no indications for surgery while in the hospital per NS - she will need her PCP to reffer hr to NS- she prefers [facility] for the proximity    Vertigo -Apparently was diagnosed in December with BPPV - I do notice a horizontal nystagmus when she looks to the right -Vestibular evaluation performed in the hospital is not suggestive of BPPV  Hypokalemia - Replaced  Sinus bradycardia - per cardiology PA, no further work up is needed    Bipolar disorder [facility]) -She takes [place] daily and IM -Lithium level is low at 0.25 despite taking Lithium - per psych, continue these meds -There are multiple other medications listed on her list which she states she is not taking.  These include Adderall, Wellbutrin, Latuda, Vyvanse,  Trintellix and Topamax    Obstructive sleep apnea -Continue CPAP    Asthma, chronic -No wheezing or dyspnea  DVT prophylaxis:  Place TED hose Start: 10/09/21 1312 enoxaparin (LOVENOX) injection 40 mg Start: 10/08/21 1930     Code  Status: Full Code  Consultants: Psychiatry, cardiology Level of Care: Level of care: Telemetry Cardiac Disposition Plan:  Status is: Inpatient due to ongoing orthostatic symptoms and work up  Objective:   Vitals:   10/12/21 0431 10/12/21 0440 10/12/21 1103 10/12/21 1114  BP: 99/63 (!) 81/52 119/78 (!) 79/58  Pulse: (!) 48 78 (!) 48 70  Resp:   17 18  Temp:   98.8 F (37.1 C)   TempSrc:   Oral   SpO2: 99% 100% 99% 100%  Weight:      Height:        Filed Weights   10/08/21 1533  Weight: 75.3 kg   Exam: General exam: Appears comfortable  HEENT: PERRLA, oral mucosa moist, no sclera icterus or thrush Respiratory system: Clear to auscultation. Respiratory effort normal. Cardiovascular system: S1 & S2 heard, regular rate and rhythm Gastrointestinal system: Abdomen soft, non-tender, nondistended. Normal bowel sounds   Central nervous system: Alert and oriented. No focal neurological deficits. Extremities: No cyanosis, clubbing or edema Skin: No rashes or ulcers Psychiatry:  Mood & affect appropriate.     Imaging and lab data was personally reviewed    CBC: Recent Labs  Lab 10/08/21 1535 10/12/21 0430  WBC 9.5 8.9  NEUTROABS 6.4  --   HGB 14.9 13.6  HCT 46.1* 42.1  MCV 86.2 85.7  PLT 217 829    Basic Metabolic Panel: Recent Labs  Lab 10/08/21 1535 10/09/21 0604 10/10/21 0745 10/12/21 0430  NA 139 140 139 141  K 3.8 3.4* 3.9 3.7  CL 104 110 114* 105  CO2 '29 24 23 31  '$ GLUCOSE 94 96 87 99  BUN '13 10 7 13  '$ CREATININE 1.07* 0.91 0.76 1.03*  CALCIUM 9.3 7.7* 8.4* 8.8*    GFR: Estimated Creatinine Clearance: 72.3 mL/min (A) (by C-G formula based on SCr of 1.03 mg/dL (H)).  Scheduled Meds:  enoxaparin (LOVENOX) injection  40 mg Subcutaneous Q24H   lithium carbonate  300 mg Oral BID   midodrine  10 mg Oral TID WC   montelukast  10 mg Oral QHS   Continuous Infusions:     LOS: 3 days   Author: Debbe Odea  10/12/2021 4:09 PM

## 2021-10-12 NOTE — Consult Note (Signed)
Consulting Department: Hospital medicine  Primary Physician:  Gwyneth Sprout, FNP  Chief Complaint:  Pituitary Mass  History of Present Illness: 10/12/2021 Ann Morales is a 46 y.o. female who presents with the chief complaint of orthostatic hypotension.  She has a known sellar mass and was previously evaluated at [facility] and offered surgical resection.  She was unable to do so given some financial concerns and since did not have any further follow-up.  She presents now with orthostatic hypotension.  At her initial evaluation 1 year ago she did have some bilateral lateral field blurring but no other neurologic findings.  She did have an elevated prolactin at that time and continues to do so, likely from stalk effect.  She was admitted for worsening dizziness that she states has been getting more severe over the past month.  She is having some orthostasis.  She had an MRI which demonstrated a sellar mass so neurosurgery was consulted.  Notably she does take lithium for bipolar disorder, and was previously off of it but restarted approximately 3 weeks ago.  Review of Systems:  A 10 point review of systems is negative, except for the pertinent positives and negatives detailed in the HPI.  Past Medical History: Past Medical History:  Diagnosis Date   ADHD (attention deficit hyperactivity disorder)    Anxiety    Arthritis    Asthma    well controlled   Bipolar disorder ([place])    COVID-19 03/29/2020   Depression    GERD (gastroesophageal reflux disease)    Headache    migraines   History of kidney stones    currently as of 05-30-19   Sleep apnea    cpap   Wears dentures    partial upper    Past Surgical History: Past Surgical History:  Procedure Laterality Date   BACK SURGERY  03/2017   cervical fusion   CHOLECYSTECTOMY     ETHMOIDECTOMY Right 06/12/2020   Procedure: TOTAL ETHMOIDECTOMY;  Surgeon: Clyde Canterbury, MD;  Location: [place];  Service: ENT;  Laterality:  Right;   FRONTAL SINUS EXPLORATION Right 06/12/2020   Procedure: FRONTAL SINUS EXPLORATION;  Surgeon: Clyde Canterbury, MD;  Location: [place];  Service: ENT;  Laterality: Right;   GALLBLADDER SURGERY     HYSTEROSCOPY WITH NOVASURE N/A 06/02/2019   Procedure: [place];  Surgeon: Malachy Mood, MD;  Location: [facility];  Service: Gynecology;  Laterality: N/A;   IMAGE GUIDED SINUS SURGERY N/A 06/12/2020   Procedure: IMAGE GUIDED SINUS SURGERY;  Surgeon: Clyde Canterbury, MD;  Location: [place];  Service: ENT;  Laterality: N/A;  PLACED DISK ON OR CHARGE NURSE DESK 3-25 KP   LAPAROSCOPY N/A 12/21/2018   Procedure: LAPAROSCOPY DIAGNOSTIC;  Surgeon: Will Bonnet, MD;  Location: [facility];  Service: Gynecology;  Laterality: N/A;   MAXILLARY ANTROSTOMY Right 06/12/2020   Procedure: MAXILLARY ANTROSTOMY;  Surgeon: Clyde Canterbury, MD;  Location: [place];  Service: ENT;  Laterality: Right;  Covid (+) 03/29/20 sleep apnea   PLANTAR FASCIA RELEASE Left 06/12/2017   Procedure: ENDOSCOPIC PLANTAR FASCIOTOMY-RELEASE;  Surgeon: Samara Deist, DPM;  Location: [facility];  Service: Podiatry;  Laterality: Left;   REPAIR EXTENSOR TENDON Left 06/12/2017   Procedure: REPAIR FLEXOR TENDON;  Surgeon: Samara Deist, DPM;  Location: [facility];  Service: Podiatry;  Laterality: Left;   TARSAL TUNNEL RELEASE Left 06/12/2017   Procedure: TARSAL TUNNEL RELEASE-BAXTER RELEASE;  Surgeon: Samara Deist, DPM;  Location: [facility];  Service: Podiatry;  Laterality: Left;   TUBAL LIGATION      Allergies: Allergies as of 10/08/2021 - Review Complete 10/08/2021  Allergen Reaction Noted   Codeine Hives and Shortness Of Breath 08/23/2014   Azithromycin Diarrhea 03/26/2015   Calamine-zinc oxide Itching 04/09/2020   Doxycycline Nausea And Vomiting 04/02/2020   Sulfa antibiotics Other (See Comments) 08/23/2014   Zinc Other (See Comments) 04/09/2020     Medications:  Current Facility-Administered Medications:    acetaminophen (TYLENOL) tablet 650 mg, 650 mg, Oral, Q6H PRN, 650 mg at 10/11/21 0038 **OR** acetaminophen (TYLENOL) suppository 650 mg, 650 mg, Rectal, Q6H PRN, Mansy, Jan A, MD   enoxaparin (LOVENOX) injection 40 mg, 40 mg, Subcutaneous, Q24H, Mansy, Jan A, MD, 40 mg at 10/11/21 1820   ipratropium-albuterol (DUONEB) 0.5-2.5 (3) MG/3ML nebulizer solution 3 mL, 3 mL, Nebulization, QID PRN, Mansy, Jan A, MD   lithium carbonate capsule 300 mg, 300 mg, Oral, BID, Rizwan, Saima, MD, 300 mg at 10/12/21 0944   magnesium hydroxide (MILK OF MAGNESIA) suspension 30 mL, 30 mL, Oral, Daily PRN, Mansy, Jan A, MD   midodrine (PROAMATINE) tablet 10 mg, 10 mg, Oral, TID WC, Rizwan, Saima, MD, 10 mg at 10/12/21 1212   montelukast (SINGULAIR) tablet 10 mg, 10 mg, Oral, QHS, Mansy, Jan A, MD, 10 mg at 10/11/21 2230   ondansetron (ZOFRAN) tablet 4 mg, 4 mg, Oral, Q6H PRN **OR** ondansetron (ZOFRAN) injection 4 mg, 4 mg, Intravenous, Q6H PRN, Mansy, Jan A, MD   SUMAtriptan (IMITREX) tablet 50 mg, 50 mg, Oral, Q2H PRN, Mansy, Jan A, MD, 50 mg at 10/10/21 2113   Social History: Social History   Tobacco Use   Smoking status: Every Day    Packs/day: 2.00    Years: 15.00    Total pack years: 30.00    Types: Cigarettes    Start date: 09/25/1994   Smokeless tobacco: Never  Vaping Use   Vaping Use: Never used  Substance Use Topics   Alcohol use: No    Alcohol/week: 0.0 standard drinks of alcohol   Drug use: No    Family Medical History: Family History  Problem Relation Age of Onset   Hypercholesterolemia Father    Bipolar disorder Mother    Hypercholesterolemia Maternal Grandmother    Hypertension Maternal Grandmother    Anxiety disorder Maternal Grandmother    Depression Maternal Grandmother    Ovarian cancer Maternal Grandmother    Colon cancer Maternal Grandfather 60   Ovarian cancer Paternal Grandmother     Physical  Examination: Vitals:   10/12/21 1103 10/12/21 1114  BP: 119/78 (!) 79/58  Pulse: (!) 48 70  Resp: 17 18  Temp: 98.8 F (37.1 C)   SpO2: 99% 100%     General: Patient is well developed, well nourished, calm, collected, and in no apparent distress.  NEUROLOGICAL:  General: In no acute distress.   Awake, alert, oriented to person, place, and time.  Pupils equal round and reactive to light.  Full Facial tone is symmetric.  Tongue protrusion is midline.  Bilateral upper extremities are full strength proximally and distally.  There is no pronator drift.  Language is conversant.  No gross visual field cut, some slight delay in her ability to recognize the furthest edges of her lateral visual fields.    Bilateral upper and lower extremity sensation is intact to light touch.  Imaging: Narrative & Impression  CLINICAL DATA:  Vertigo   EXAM: MRI HEAD WITHOUT CONTRAST   TECHNIQUE: Multiplanar, multiecho  pulse sequences of the brain and surrounding structures were obtained without intravenous contrast.   COMPARISON:  07/09/2020 brain MRI   FINDINGS: Brain: No acute infarct, mass effect or extra-axial collection. No acute or chronic hemorrhage. Normal white matter signal, parenchymal volume and CSF spaces. The midline structures are normal. 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   Vascular: Major flow voids are preserved.   Skull and upper cervical spine: Normal calvarium and skull base. Visualized upper cervical spine and soft tissues are normal.   Sinuses/Orbits:No paranasal sinus fluid levels or advanced mucosal thickening. No mastoid or middle ear effusion. Normal orbits.   IMPRESSION: 1. No acute intracranial abnormality. 2. 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     Electronically Signed   By: Ulyses Jarred M.D.   On: 10/09/2021 01:32       I have  personally reviewed the images and agree with the above interpretation.  Labs:    Latest Ref Rng & Units 10/12/2021    4:30 AM 10/08/2021    3:35 PM 08/12/2019    3:27 PM  CBC  WBC 4.0 - 10.5 K/uL 8.9  9.5  11.2   Hemoglobin 12.0 - 15.0 g/dL 13.6  14.9  13.7   Hematocrit 36.0 - 46.0 % 42.1  46.1  41.9   Platelets 150 - 400 K/uL 182  217  226     Assessment and Plan: Ann Morales is a pleasant 46 y.o. female with a history of a pituitary mass well-known to the neurooncology team at [facility].  Had previous scheduled resection but ended up canceling for financial reasons.  She comes in today with approximately 3 weeks of feeling unsteady on her feet.  She was found to have severe orthostatic hypotension.  As part of her work-up she had updated scans of her brain demonstrating a relatively stable suprasellar mass displacing the optic chiasm upwards.  She has a very slight headache pupils are round and reactive, extraocular motions are full, and her visual fields at least at the lateral margins were slightly blurred but no gross field cut noted on rudimentary exam.  No evidence of CSF leak.  Imaging is stable.  At this point should you be concerned that the pituitary is because of her orthostasis, would recommend transfer to a tertiary center for management.  She does not have the classic constellation of symptoms for pituitary apoplexy and her imaging does not support that possibility either.  She would benefit from formal visual field testing, as the standard neurologic exam is better at detecting large deficits rather than progressive small changes.  Could develop outpatient follow-up if presumption is that the pituitary tumor is not causing her symptoms.  They prefer [facility] if possible given his location and proximity to their home.   Stevan Born, MD/MSCR Dept. of Neurosurgery

## 2021-10-13 DIAGNOSIS — F319 Bipolar disorder, unspecified: Secondary | ICD-10-CM | POA: Diagnosis not present

## 2021-10-13 DIAGNOSIS — G4733 Obstructive sleep apnea (adult) (pediatric): Secondary | ICD-10-CM

## 2021-10-13 DIAGNOSIS — R42 Dizziness and giddiness: Secondary | ICD-10-CM | POA: Diagnosis not present

## 2021-10-13 DIAGNOSIS — I951 Orthostatic hypotension: Secondary | ICD-10-CM | POA: Diagnosis not present

## 2021-10-13 MED ORDER — MIDODRINE HCL 10 MG PO TABS: 10.0000 mg | ORAL_TABLET | Freq: Three times a day (TID) | ORAL | 0 refills | Status: DC

## 2021-10-13 MED ORDER — MIDODRINE HCL 5 MG PO TABS
10.0000 mg | ORAL_TABLET | Freq: Three times a day (TID) | ORAL | Status: DC
  Administered 2021-10-13 – 2021-10-17 (×13): 10 mg via ORAL
  Filled 2021-10-13 (×13): qty 2

## 2021-10-13 MED ORDER — SODIUM CHLORIDE 0.9 % IV BOLUS
500.0000 mL | Freq: Once | INTRAVENOUS | Status: AC
  Administered 2021-10-13: 500 mL via INTRAVENOUS

## 2021-10-13 MED ORDER — SODIUM CHLORIDE 0.9 % IV BOLUS
500.0000 mL | Freq: Once | INTRAVENOUS | Status: AC
Start: 2021-10-13 — End: 2021-10-13
  Administered 2021-10-13: 500 mL via INTRAVENOUS

## 2021-10-13 NOTE — TOC Progression Note (Addendum)
Transition of Care [facility]) - Progression Note    Patient Details  Name: Ann Morales MRN: [id number] Date of Birth: 30-Aug-1975  [facility] CM/SW Contact  Izola Price, RN Phone Number: 10/13/2021, 12:21 PM  Clinical Narrative: 8/6: Outpatient PT recommended by PT. Patient remains with orthostatic issues. Outpatient options list explained, printed to patient, and outpatient progress note added to chart, partially filled out. Simmie Davies RN CM       Barriers to Discharge: Continued Medical Work up  Expected Discharge Plan and Services                                                 Social Determinants of Health (SDOH) Interventions    Readmission Risk Interventions     No data to display

## 2021-10-13 NOTE — Progress Notes (Signed)
Brief hospital course: This is a 46 year old female with bipolar disorder, asthma, obstructive sleep apnea may, sellar/suprasellar mass, migraines, dyslipidemia who presents to the hospital for dizziness.  He states the dizziness has been present for about 1 month. She tells me she has not seen her PCP in about 1 to 2 years however, there is a note in the chart from her PCP who evaluated her on 12/22 for "dizziness" and diagnosed her with vertigo.  I have discussed this with the patient.   An MRI was performed in the ED and is essentially unchanged regarding the sellar/suprasellar mass.  The patient was given 2-1/2 L of IV fluids and boluses and then started on 200 cc/h of normal saline along with midodrine. She was also noted to have a heart rate in the 40s and otherwise normal vital signs.   In regards to her psych medications, she was off her lithium and restarted about 3 weeks ago.  She continues to receive Invega injections monthly and last had one about 2 weeks ago.  She did run out of Klonopin and did not resume this   Subjective:  No new complaints- still dizzy   Assessment and Plan: Principal Problem:   Orthostatic hypotension Sellar and suprasellar mass - The patient admits to feeling dizzy Despite adequate hydration, and TED stockings she continues to be quite orthostatic with symptoms of lightheadedness which she refers to as dizziness - appreciate psych eval-  Dr Weber Cooks does not feel [name] is the cause -BP increasing after starting Midodrine but still orthostatic today (109/58> 81/52) despite Increasing Midodrine to 10 mg  -Due to pituitary mass, doing endocrine work-up with 24-hour urine cortisol pending -  ACTH level normal  I  - Prolactin elevated at 83.0 - TFTs normal - 24 hr urine collection for cortisol was started this AM - note from 10/16/20> neurosurgeon, Dr. Lacinda Axon at [facility] recommended a full ophthalmological eval and surgical resection which she agreed with at the  time-subsequent telephone note from 12/22 mentions that the patient canceled the surgery due to financial limitations and difficulty traveling back and forth  -- appreciate NS consult- no indications for surgery per NS   Active Problems:   Vertigo- -Apparently was diagnosed in December with BPPV - I do notice a horizontal nystagmus when she looks to the right -Vestibular evaluation performed in the hospital s not suggestive of BPPV   Hypokalemia - Replaced   Sinus bradycardia - per cardiology PA, no further work up is needed     Bipolar disorder [facility]) -She takes [place] daily and IM -Lithium level is low at 0.25 despite taking Lithium - per psych, continue these meds -There are multiple other medications listed on her list which she states she is not taking.  These include Adderall, Wellbutrin, Latuda, Vyvanse,  Trintellix and Topamax     Obstructive sleep apnea -Continue CPAP     Asthma, chronic -No wheezing or dyspnea   DVT prophylaxis:  Place TED hose Start: 10/09/21 1312 enoxaparin (LOVENOX) injection 40 mg Start: 10/08/21 1930       Code Status: Full Code  Consultants: Psychiatry, cardiology Level of Care: Level of care: Telemetry Cardiac Disposition Plan:  Status is: Inpatient due to ongoing orthostatic symptoms and work up   Objective:         Vitals:    10/12/21 0431 10/12/21 0440 10/12/21 1103 10/12/21 1114  BP: 99/63 (!) 81/52 119/78 (!) 79/58  Pulse: (!) 48 78 (!) 48 70  Resp:  17 18  Temp:     98.8 F (37.1 C)    TempSrc:     Oral    SpO2: 99% 100% 99% 100%  Weight:          Height:               Filed Weights    10/08/21 1533  Weight: 75.3 kg    Exam: General exam: Appears comfortable  HEENT: PERRLA, oral mucosa moist, no sclera icterus or thrush Respiratory system: Clear to auscultation. Respiratory effort normal. Cardiovascular system: S1 & S2 heard, regular rate and rhythm Gastrointestinal system: Abdomen soft, non-tender, nondistended.  Normal bowel sounds   Central nervous system: Alert and oriented. No focal neurological deficits. Extremities: No cyanosis, clubbing or edema Skin: No rashes or ulcers Psychiatry:  Mood & affect appropriate.       Imaging and lab data was personally reviewed      CBC: Last Labs       Recent Labs  Lab 10/08/21 1535 10/12/21 0430  WBC 9.5 8.9  NEUTROABS 6.4  --   HGB 14.9 13.6  HCT 46.1* 42.1  MCV 86.2 85.7  PLT 217 182       Basic Metabolic Panel: Last Labs         Recent Labs  Lab 10/08/21 1535 10/09/21 0604 10/10/21 0745 10/12/21 0430  NA 139 140 139 141  K 3.8 3.4* 3.9 3.7  CL 104 110 114* 105  CO2 '29 24 23 31  '$ GLUCOSE 94 96 87 99  BUN '13 10 7 13  '$ CREATININE 1.07* 0.91 0.76 1.03*  CALCIUM 9.3 7.7* 8.4* 8.8*       GFR: Estimated Creatinine Clearance: 72.3 mL/min (A) (by C-G formula based on SCr of 1.03 mg/dL (H)).   Scheduled Meds:  enoxaparin (LOVENOX) injection  40 mg Subcutaneous Q24H   lithium carbonate  300 mg Oral BID   midodrine  10 mg Oral TID WC   montelukast  10 mg Oral QHS    Continuous Infusions:        LOS: 3 days    Author: Debbe Odea  10/12/2021 4:09 PM

## 2021-10-13 NOTE — TOC Initial Note (Signed)
Transition of Care [facility]) - Initial/Assessment Note    Patient Details  Name: Ann Morales MRN: [id number] Date of Birth: 28-Aug-1975  Transition of Care [facility]) CM/SW Contact:    Izola Price, RN Phone Number: 10/13/2021, 12:43 PM  Clinical Narrative:  8/6: Spoke with patient regarding CM and discharge planning. Patient has PCP via [facility], though patient lives in [place]. She has transportation to outpatient PT depending on day of the week and spouse work schedule as only has one car. Has some concerns if Medicaid pays for outpatient services.  Explained outpatient rehab  list and her choices/payment may depend on that choice and to contact them.  Requested meal tray for spouse per unit RN. Charge Nurse can arrange for visitor tray. Patient lives in one level house in one step into house. She has been dizzy at night with recent blood pressure issues but lives in an apartment and does not have any grab bars.  PCP: Tally Joe FNP.   RX: [id number] [place], [place] - [address]     Barbie Hyatt Capobianco RN CM              Barriers to Discharge: Continued Medical Work up   Patient Goals and CMS Choice        Expected Discharge Plan and Services                                                Prior Living Arrangements/Services                       Activities of Daily Living Home Assistive Devices/Equipment: None ADL Screening (condition at time of admission) Patient's cognitive ability adequate to safely complete daily activities?: Yes Is the patient deaf or have difficulty hearing?: No Does the patient have difficulty seeing, even when wearing glasses/contacts?: No Does the patient have difficulty concentrating, remembering, or making decisions?: No Patient able to express need for assistance with ADLs?: Yes Does the patient have difficulty dressing or bathing?: No Independently performs ADLs?: Yes (appropriate for developmental  age) Does the patient have difficulty walking or climbing stairs?: No Weakness of Legs: None Weakness of Arms/Hands: None  Permission Sought/Granted                  Emotional Assessment              Admission diagnosis:  Orthostatic hypotension [I95.1] Lightheadedness [R42] Vertigo [R42] Symptomatic bradycardia [R00.1] Patient Active Problem List   Diagnosis Date Noted   Orthostatic hypotension 10/08/2021   Asthma, chronic 10/08/2021   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   Vertigo 10/08/2021   Acute recurrent pansinusitis 03/08/2020   Ear pain, right 03/08/2020   History of migraine headaches 10/13/2019   Menometrorrhagia 05/02/2019   Mixed incontinence 05/02/2019   Pelvic pain in female 12/21/2018   Abdominal pain, right lower quadrant 12/21/2018   Fusion of spine of cervical region 03/13/2017   Cervical disc disorder with radiculopathy 02/25/2017   Lymphedema 04/24/2016   Varicose veins of bilateral lower extremities with pain 04/08/2016   Chronic venous insufficiency 04/08/2016   Obstructive sleep apnea 04/04/2016   Adult ADHD 01/18/2016   Bipolar disorder ([place] 01/18/2016   Other specified anxiety disorders 01/18/2016   Allergic rhinitis 01/15/2015   Current smoker 01/15/2015   H/O  manic depressive disorder 01/15/2015   H/O renal calculi 01/15/2015   Asthma 01/15/2015   HLD (hyperlipidemia) 10/25/2013   PCP:  Gwyneth Sprout, FNP Pharmacy:   [id number] [place]: [phone]: [phone] [id number] [place]: [phone]: [phone]   Social Determinants of Health (SDOH) Interventions    Readmission Risk Interventions     No data to display

## 2021-10-13 NOTE — Progress Notes (Cosign Needed Addendum)
Occupational Therapy * Physical Therapy * Speech Therapy          DATE: 10/13/21 PATIENT NAME: Ann Morales PATIENT MRN: [id number]  DIAGNOSIS/DIAGNOSIS CODE ______________________ DATE OF DISCHARGE: ______________ Sueanne Margarita CARE PHYSICIAN : Jaci Standard. Rollene Rotunda, FNP.  [facility] [address] [place],  [address] PCP PHONE/FAX: Phone: [phone]  FAX:________________    Dear Provider (Name): Fax:   I certify that I have examined this patient and that occupational/physical/speech therapy is necessary on an outpatient basis.    The patient has expressed interest in completing their recommended course of therapy at your location.  Once a formal order from the patient's primary care physician has been obtained, please contact him/her to schedule an appointment for evaluation at your earliest convenience.   [  ]  Physical Therapy Evaluate and Treat          [  ]  Occupational Therapy Evaluate and Treat                                    [  ]  Speech Therapy Evaluate and Treat       The patient's primary care physician (listed above) must furnish and be responsible for a formal order such that the recommended services may be furnished while under the primary physician's care, and that the plan of care will be established and reviewed every 30 days (or more often if condition necessitates).

## 2021-10-14 ENCOUNTER — Inpatient Hospital Stay
Admit: 2021-10-14 | Discharge: 2021-10-14 | Disposition: A | Discharge status: Home / Self Care | Payer: Medicaid Other | Attending: Internal Medicine | Admitting: Internal Medicine

## 2021-10-14 DIAGNOSIS — F319 Bipolar disorder, unspecified: Secondary | ICD-10-CM | POA: Diagnosis not present

## 2021-10-14 DIAGNOSIS — D352 Benign neoplasm of pituitary gland: Secondary | ICD-10-CM | POA: Diagnosis not present

## 2021-10-14 DIAGNOSIS — G4733 Obstructive sleep apnea (adult) (pediatric): Secondary | ICD-10-CM | POA: Diagnosis not present

## 2021-10-14 DIAGNOSIS — I951 Orthostatic hypotension: Secondary | ICD-10-CM | POA: Diagnosis not present

## 2021-10-14 LAB — BASIC METABOLIC PANEL
Anion gap: 5 (ref 5–15)
BUN: 14 mg/dL (ref 6–20)
CO2: 27 mmol/L (ref 22–32)
Calcium: 8.8 mg/dL — ABNORMAL LOW (ref 8.9–10.3)
Chloride: 108 mmol/L (ref 98–111)
Creatinine, Ser: 0.77 mg/dL (ref 0.44–1.00)
GFR, Estimated: >60 mL/min (ref >60)
Glucose, Bld: 88 mg/dL (ref 70–99)
Potassium: 3.6 mmol/L (ref 3.5–5.1)
Sodium: 140 mmol/L (ref 135–145)

## 2021-10-14 LAB — ECHOCARDIOGRAM COMPLETE
AR max vel: 2.92 cm2
AV Area VTI: 3.12 cm2
AV Area mean vel: 2.93 cm2
AV Mean grad: 2 mmHg
AV Peak grad: 3.8 mmHg
Ao pk vel: 0.98 m/s
Area-P 1/2: 4.8 cm2
Height: 67 in
S' Lateral: 3.2 cm
Weight: 2656 oz

## 2021-10-14 LAB — CBC
HCT: 38.4 % (ref 36.0–46.0)
Hemoglobin: 12.9 g/dL (ref 12.0–15.0)
MCH: 28.7 pg (ref 26.0–34.0)
MCHC: 33.6 g/dL (ref 30.0–36.0)
MCV: 85.5 fL (ref 80.0–100.0)
Platelets: 176 10*3/uL (ref 150–400)
RBC: 4.49 MIL/uL (ref 3.87–5.11)
RDW: 12.8 % (ref 11.5–15.5)
WBC: 10 10*3/uL (ref 4.0–10.5)
nRBC: 0 % (ref 0.0–0.2)

## 2021-10-14 MED ORDER — FLUDROCORTISONE ACETATE 0.1 MG PO TABS
0.1000 mg | ORAL_TABLET | Freq: Every day | ORAL | Status: DC
  Administered 2021-10-14 – 2021-10-15 (×2): 0.1 mg via ORAL
  Filled 2021-10-14 (×2): qty 1

## 2021-10-14 NOTE — Progress Notes (Signed)
[facility] Progress Note  Patient: Ann Morales     NIO:[id number]  DOA: 10/08/2021   PCP: Gwyneth Sprout, FNP       Brief hospital course: This is a 46 year old female with bipolar disorder, asthma, obstructive sleep apnea may, sellar/suprasellar mass, migraines, dyslipidemia who presents to the hospital for dizziness.  He states the dizziness has been present for about 1 month. She tells me she has not seen her PCP in about 1 to 2 years however, there is a note in the chart from her PCP who evaluated her on 12/22 for "dizziness" and diagnosed her with vertigo.  I have discussed this with the patient.  An MRI was performed in the ED and is essentially unchanged regarding the sellar/suprasellar mass.  The patient was given 2-1/2 L of IV fluids and boluses and then started on 200 cc/h of normal saline along with midodrine. She was also noted to have a heart rate in the 40s and otherwise normal vital signs.  In regards to her psych medications, she was off her lithium and restarted about 3 weeks ago.  She continues to receive Invega injections monthly and last had one about 2 weeks ago.  She did run out of Klonopin and did not resume this  Subjective:  She is dizzy with standing again today and severely orthostatic.   Assessment and Plan: Principal Problem:   Orthostatic hypotension - The patient admits to feeling dizzy Despite adequate hydration, and TED stockings she continues to be quite orthostatic with symptoms of lightheadedness which she refers to as dizziness - appreciate psych eval-  Dr Weber Cooks does not feel [name] is the cause  - despite Increasing Midodrine to 10 mg TID, orthostatics now similar to what they were when she was first admitted 88/65> 52/41- HR increases from 63 to 93 -   ECHO shows grade 2 diastolic dysfunction and no valvular issues - Given another liter of IVF on 8/8- no improvement - start Fludrocortisone today  Active Problems:  Sellar and  suprasellar mass -  24-hour urine cortisol collected and results pending -  ACTH level normal   - Prolactin elevated at 83.0 - TFTs normal - note from 10/16/20> neurosurgeon, Dr. Lacinda Axon at [facility] recommended a full ophthalmological eval and surgical resection which she agreed with at the time-subsequent telephone note from 12/22 mentions that the patient canceled the surgery due to financial limitations and difficulty traveling back and forth  -- appreciate NS consult- no indications for surgery while in the hospital per NS - she will need her PCP to reffer hr to NS- she prefers [facility] for the proximity    Vertigo -Apparently was diagnosed in December with BPPV - I do notice a horizontal nystagmus when she looks to the right -Vestibular evaluation performed in the hospital is not suggestive of BPPV  Hypokalemia - Replaced  Sinus bradycardia - per cardiology PA, no further work up is needed    Bipolar disorder [facility]) -She takes Invega IM -Lithium level is low at 0.25 despite taking Lithium - per psych, continue these meds -There are multiple other medications listed on her list which she states she is not taking.  These include Adderall, Wellbutrin, Latuda, Vyvanse,  Trintellix and Topamax    Obstructive sleep apnea -Continue CPAP    Asthma, chronic -No wheezing or dyspnea  DVT prophylaxis:  Place TED hose Start: 10/09/21 1312 enoxaparin (LOVENOX) injection 40 mg Start: 10/08/21 1930     Code Status: Full Code  Consultants: Psychiatry, cardiology Level of Care: Level of care: Telemetry Cardiac Disposition Plan:  Status is: Inpatient due to ongoing orthostatic symptoms    Objective:   Vitals:   10/14/21 0335 10/14/21 0941 10/14/21 1222 10/14/21 1444  BP: (!) 97/59 100/66 (!) 96/57 (!) 87/49  Pulse: (!) 52 (!) 55 (!) 54 (!) 56  Resp:  '16 17 17  '$ Temp: 98.5 F (36.9 C) (!) 96.9 F (36.1 C) 97.6 F (36.4 C) 98.1 F (36.7 C)  TempSrc: Oral Axillary  Oral  SpO2: 97% 100%  98% 100%  Weight:      Height:       Filed Weights   10/08/21 1533  Weight: 75.3 kg   Exam: General exam: Appears comfortable  HEENT: PERRLA, oral mucosa moist, no sclera icterus or thrush Respiratory system: Clear to auscultation. Respiratory effort normal. Cardiovascular system: S1 & S2 heard, regular rate and rhythm Gastrointestinal system: Abdomen soft, non-tender, nondistended. Normal bowel sounds   Central nervous system: Alert and oriented. No focal neurological deficits. Extremities: No cyanosis, clubbing or edema Skin: No rashes or ulcers Psychiatry:  Mood & affect appropriate.      Imaging and lab data was personally reviewed    CBC: Recent Labs  Lab 10/08/21 1535 10/12/21 0430 10/14/21 0413  WBC 9.5 8.9 10.0  NEUTROABS 6.4  --   --   HGB 14.9 13.6 12.9  HCT 46.1* 42.1 38.4  MCV 86.2 85.7 85.5  PLT 217 182 865    Basic Metabolic Panel: Recent Labs  Lab 10/08/21 1535 10/09/21 0604 10/10/21 0745 10/12/21 0430 10/14/21 0413  NA 139 140 139 141 140  K 3.8 3.4* 3.9 3.7 3.6  CL 104 110 114* 105 108  CO2 '29 24 23 31 27  '$ GLUCOSE 94 96 87 99 88  BUN '13 10 7 13 14  '$ CREATININE 1.07* 0.91 0.76 1.03* 0.77  CALCIUM 9.3 7.7* 8.4* 8.8* 8.8*    GFR: Estimated Creatinine Clearance: 93.1 mL/min (by C-G formula based on SCr of 0.77 mg/dL).  Scheduled Meds:  enoxaparin (LOVENOX) injection  40 mg Subcutaneous Q24H   fludrocortisone  0.1 mg Oral Daily   lithium carbonate  300 mg Oral BID   midodrine  10 mg Oral TID   montelukast  10 mg Oral QHS   Continuous Infusions:     LOS: 5 days   Author: Debbe Odea  10/14/2021 4:15 PM

## 2021-10-14 NOTE — Progress Notes (Signed)
PT Cancellation Note  Patient Details Name: Lindsi Bayliss MRN: [id number] DOB: 1975-10-11   Cancelled Treatment:    Reason Eval/Treat Not Completed: Medical issues which prohibited therapy (Patient with BP of 72/54 with orthostatic s/s during activity with mobility specialist. Will hold therapy for now.)   Aarya Robinson A Kennedie Pardoe 10/14/2021, 11:50 AM

## 2021-10-14 NOTE — Progress Notes (Signed)
Mobility Specialist - Progress Note    10/14/21 1000  Orthostatic Lying   BP- Lying (!) 82/63  Pulse- Lying (!) 46  Orthostatic Sitting  BP- Sitting (!) 72/54  Pulse- Sitting 54  Orthostatic Standing at 0 minutes  BP- Standing at 0 minutes (!) 72/54  Pulse- Standing at 0 minutes 66  Mobility  Activity Stood at bedside;Dangled on edge of bed  Level of Therapist, art wheel walker  Distance Ambulated (ft) 0 ft  Activity Response Tolerated well  $Mobility charge 1 Mobility   Pt supine in bed on RA upon arrival. Pt voices some dizziness in bed upon arrival. Pt scoots to EOB and STS indep. Pt complains of worsening dizziness and unable to complete Orthostatic Standing at 3 minutes. RN notified. Pt returns to bed with needs in reach and bed alarm set.   Gretchen Short  Mobility Specialist  10/14/21 10:58 AM

## 2021-10-14 NOTE — Progress Notes (Signed)
*  PRELIMINARY RESULTS* Echocardiogram 2D Echocardiogram has been performed.  Sherrie Sport 10/14/2021, 8:19 AM

## 2021-10-15 DIAGNOSIS — F319 Bipolar disorder, unspecified: Secondary | ICD-10-CM | POA: Diagnosis not present

## 2021-10-15 DIAGNOSIS — D352 Benign neoplasm of pituitary gland: Secondary | ICD-10-CM | POA: Diagnosis not present

## 2021-10-15 DIAGNOSIS — G4733 Obstructive sleep apnea (adult) (pediatric): Secondary | ICD-10-CM | POA: Diagnosis not present

## 2021-10-15 DIAGNOSIS — I951 Orthostatic hypotension: Secondary | ICD-10-CM | POA: Diagnosis not present

## 2021-10-15 LAB — BASIC METABOLIC PANEL
Anion gap: 5 (ref 5–15)
BUN: 14 mg/dL (ref 6–20)
CO2: 27 mmol/L (ref 22–32)
Calcium: 9 mg/dL (ref 8.9–10.3)
Chloride: 106 mmol/L (ref 98–111)
Creatinine, Ser: 0.65 mg/dL (ref 0.44–1.00)
GFR, Estimated: >60 mL/min (ref >60)
Glucose, Bld: 92 mg/dL (ref 70–99)
Potassium: 3.4 mmol/L — ABNORMAL LOW (ref 3.5–5.1)
Sodium: 138 mmol/L (ref 135–145)

## 2021-10-15 MED ORDER — POTASSIUM CHLORIDE CRYS ER 20 MEQ PO TBCR
40.0000 meq | EXTENDED_RELEASE_TABLET | ORAL | Status: AC
  Administered 2021-10-15 (×2): 40 meq via ORAL
  Filled 2021-10-15 (×2): qty 2

## 2021-10-15 MED ORDER — FLUDROCORTISONE ACETATE 0.1 MG PO TABS
0.2000 mg | ORAL_TABLET | Freq: Every day | ORAL | Status: DC
  Administered 2021-10-16 – 2021-10-17 (×2): 0.2 mg via ORAL
  Filled 2021-10-15 (×2): qty 2

## 2021-10-15 MED ORDER — FLUDROCORTISONE ACETATE 0.1 MG PO TABS
0.1000 mg | ORAL_TABLET | Freq: Once | ORAL | Status: AC
  Administered 2021-10-15: 0.1 mg via ORAL
  Filled 2021-10-15: qty 1

## 2021-10-15 NOTE — Progress Notes (Signed)
[facility] Progress Note  Patient: Ann Morales     CZY:[id number]  DOA: 10/08/2021   PCP: Gwyneth Sprout, FNP       Brief hospital course: This is a 46 year old female with bipolar disorder, asthma, obstructive sleep apnea may, sellar/suprasellar mass, migraines, dyslipidemia who presents to the hospital for dizziness.  He states the dizziness has been present for about 1 month. She tells me she has not seen her PCP in about 1 to 2 years however, there is a note in the chart from her PCP who evaluated her on 12/22 for "dizziness" and diagnosed her with vertigo.  I have discussed this with the patient.  An MRI was performed in the ED and is essentially unchanged regarding the sellar/suprasellar mass.  The patient was given 2-1/2 L of IV fluids and boluses and then started on 200 cc/h of normal saline along with midodrine. She was also noted to have a heart rate in the 40s and otherwise normal vital signs.  In regards to her psych medications, she was off her lithium and restarted about 3 weeks ago.  She continues to receive Invega injections monthly and last had one about 2 weeks ago.  She did run out of Klonopin and did not resume this  Subjective:  She remains dizzy with standing and orthostatic.  No other complaints.  We have discussed that she needs to drink at least 2 L of water a day and I have explained this to her nurse as well.  Assessment and Plan: Principal Problem:   Orthostatic hypotension - The patient admits to feeling dizzy Despite adequate hydration, and TED stockings she continues to be quite orthostatic with symptoms of lightheadedness which she refers to as dizziness - appreciate psych eval-  Dr Weber Cooks does not feel [name] is the cause  - despite Increasing Midodrine to 10 mg TID, orthostatics now similar to what they were when she was first admitted 88/65> 52/41- HR increases from 63 to 93 -   ECHO shows grade 2 diastolic dysfunction and no valvular  issues - Given another liter of IVF on 8/8- no improvement -Will increase fludrocortisone to 0.2 mg daily tomorrow and give an additional 0.1 mg this evening - Encourage drinking 2 L of noncaffeinated liquids a day -Spent extensive amount of time explaining the pathophysiology of orthostatic hypotension, the role of the midodrine, Florinef TED hose and abdominal binder  Active Problems:  Sellar and suprasellar mass -  24-hour urine cortisol collected and results pending -  ACTH level normal   - Prolactin elevated at 83.0 - TFTs normal - note from 10/16/20> neurosurgeon, Dr. Lacinda Axon at [facility] recommended a full ophthalmological eval and surgical resection which she agreed with at the time-subsequent telephone note from 12/22 mentions that the patient canceled the surgery due to financial limitations and difficulty traveling back and forth  -- appreciate NS consult- no indications for surgery while in the hospital per NS - she will need her PCP to reffer hr to NS- she [facility] for [facility]    Vertigo -Apparently was diagnosed in December with BPPV - I do notice a horizontal nystagmus when she looks to the right -Vestibular evaluation performed in the hospital is not suggestive of BPPV  Hypokalemia - Replaced check in a.m.  Sinus bradycardia - per cardiology PA, no further work up is needed    Bipolar disorder [facility]) -She takes Invega IM -Lithium level is low at 0.25 despite taking Lithium - per psych,  continue these meds -There are multiple other medications listed on her list which she states she is not taking.  These include Adderall, Wellbutrin, Latuda, Vyvanse,  Trintellix and Topamax    Obstructive sleep apnea -Continue CPAP    Asthma, chronic -No wheezing or dyspnea  DVT prophylaxis:  Place TED hose Start: 10/09/21 1312 enoxaparin (LOVENOX) injection 40 mg Start: 10/08/21 1930     Code Status: Full Code  Consultants: Psychiatry, cardiology Level of Care:  Level of care: Telemetry Cardiac Disposition Plan:  Status is: Inpatient due to ongoing orthostatic symptoms    Objective:   Vitals:   10/14/21 2254 10/15/21 0400 10/15/21 0758 10/15/21 1129  BP: 114/69 (!) 90/58 105/73 100/71  Pulse: (!) 45 (!) 55 (!) 49 (!) 54  Resp: '20 14 16 16  '$ Temp: 97.7 F (36.5 C) 98.1 F (36.7 C) 98 F (36.7 C) 98.2 F (36.8 C)  TempSrc: Oral Oral Oral Oral  SpO2: 99%  96% 100%  Weight:      Height:       Filed Weights   10/08/21 1533  Weight: 75.3 kg   Exam: General exam: Appears comfortable  HEENT: PERRLA, oral mucosa moist, no sclera icterus or thrush Respiratory system: Clear to auscultation. Respiratory effort normal. Cardiovascular system: S1 & S2 heard, regular rate and rhythm Gastrointestinal system: Abdomen soft, non-tender, nondistended. Normal bowel sounds   Central nervous system: Alert and oriented. No focal neurological deficits. Extremities: No cyanosis, clubbing or edema Skin: No rashes or ulcers Psychiatry: Flat affect   Imaging and lab data was personally reviewed    CBC: Recent Labs  Lab 10/08/21 1535 10/12/21 0430 10/14/21 0413  WBC 9.5 8.9 10.0  NEUTROABS 6.4  --   --   HGB 14.9 13.6 12.9  HCT 46.1* 42.1 38.4  MCV 86.2 85.7 85.5  PLT 217 182 595    Basic Metabolic Panel: Recent Labs  Lab 10/09/21 0604 10/10/21 0745 10/12/21 0430 10/14/21 0413 10/15/21 0620  NA 140 139 141 140 138  K 3.4* 3.9 3.7 3.6 3.4*  CL 110 114* 105 108 106  CO2 '24 23 31 27 27  '$ GLUCOSE 96 87 99 88 92  BUN '10 7 13 14 14  '$ CREATININE 0.91 0.76 1.03* 0.77 0.65  CALCIUM 7.7* 8.4* 8.8* 8.8* 9.0    GFR: Estimated Creatinine Clearance: 93.1 mL/min (by C-G formula based on SCr of 0.65 mg/dL).  Scheduled Meds:  enoxaparin (LOVENOX) injection  40 mg Subcutaneous Q24H   fludrocortisone  0.1 mg Oral Once   [START ON 10/16/2021] fludrocortisone  0.2 mg Oral Daily   lithium carbonate  300 mg Oral BID   midodrine  10 mg Oral TID    montelukast  10 mg Oral QHS   potassium chloride  40 mEq Oral Q4H   Continuous Infusions:     LOS: 6 days   Author: Debbe Odea  10/15/2021 2:24 PM

## 2021-10-15 NOTE — Progress Notes (Signed)
Patient still orthostatic.  Paged MD  Per MD order- placed abdominal binder (small)

## 2021-10-15 NOTE — Progress Notes (Signed)
Physical Therapy Treatment Patient Details Name: Ann Morales MRN: [id number] DOB: 06-02-1975 Today's Date: 10/15/2021   History of Present Illness Pt is a 46 y.o. Caucasian female with medical history significant for adult ADHD, bipolar obstructive sleep apnea on home CPAP, GERD, migraine, pituitary tumor and dyslipidemia who presented to the emergency room with acute onset of  dizziness which started about a month ago and has been getting significantly worse over the last 2 weeks with associated vertigo, as well as lightheadedness and occasional presyncope.    PT Comments    Patient alert, agreeable to PT, denied pain. Pt reported she is still experiencing dizziness. Orthostatic vitals assessed, pt still with low BP and symptomatic, so true ambulation deferred but pt able to mobilize independent in and out of bed, modI to stand with RW to promote pt feelings of stability. Habituation exercises updated as able, pt verbalized understanding. RN notified of pt status as well as lack of ted hoses this AM and abdominal binder. Current plan remains appropriate pending progress with mobility.   Of note pt noted to be emotional and expressed frustration during session, RN notified as well.    Recommendations for follow up therapy are one component of a multi-disciplinary discharge planning process, led by the attending physician.  Recommendations may be updated based on patient status, additional functional criteria and insurance authorization.  Follow Up Recommendations  Outpatient PT     Assistance Recommended at Discharge Frequent or constant Supervision/Assistance  Patient can return home with the following Assistance with cooking/housework;Assist for transportation;Help with stairs or ramp for entrance   Equipment Recommendations  Rolling walker (2 wheels)    Recommendations for Other Services       Precautions / Restrictions Precautions Precautions: Fall Restrictions Weight  Bearing Restrictions: No     Mobility  Bed Mobility Overal bed mobility: Independent                  Transfers Overall transfer level: Modified independent Equipment used: Rolling walker (2 wheels)                    Ambulation/Gait               General Gait Details: deferred low BP   Stairs             Wheelchair Mobility    Modified Rankin (Stroke Patients Only)       Balance Overall balance assessment: Needs assistance Sitting-balance support: Feet supported Sitting balance-Leahy Scale: Good       Standing balance-Leahy Scale: Good                              Cognition Arousal/Alertness: Awake/alert Behavior During Therapy: WFL for tasks assessed/performed Overall Cognitive Status: Within Functional Limits for tasks assessed                                          Exercises Other Exercises Other Exercises: Habituation exercise, progressed as able: gaze stabilization taught to the patient with min-mod symptoms occuring. Pt educated to complete exercise x30s every hour and PT will continue to progress as POC completed.    General Comments        Pertinent Vitals/Pain Pain Assessment Pain Assessment: No/denies pain    Home Living  Prior Function            PT Goals (current goals can now be found in the care plan section) Progress towards PT goals: Progressing toward goals (limited due to low BP)    Frequency    Min 2X/week      PT Plan Current plan remains appropriate    Co-evaluation              AM-PAC PT "6 Clicks" Mobility   Outcome Measure  Help needed turning from your back to your side while in a flat bed without using bedrails?: None Help needed moving from lying on your back to sitting on the side of a flat bed without using bedrails?: None Help needed moving to and from a bed to a chair (including a wheelchair)?: None Help  needed standing up from a chair using your arms (e.g., wheelchair or bedside chair)?: None Help needed to walk in hospital room?: A Little Help needed climbing 3-5 steps with a railing? : A Little 6 Click Score: 22    End of Session   Activity Tolerance: Patient tolerated treatment well Patient left: in bed;with call bell/phone within reach;with bed alarm set Nurse Communication: Mobility status PT Visit Diagnosis: Other abnormalities of gait and mobility (R26.89)     Time: 6720-9470 PT Time Calculation (min) (ACUTE ONLY): 19 min  Charges:  $Therapeutic Activity: 8-22 mins                     Lieutenant Diego PT, DPT 12:44 PM,10/15/21

## 2021-10-15 NOTE — Progress Notes (Signed)
Performed manual orthostatics on patient.  Patient tolerated ok.  Patient did have dizziness with standing and became pale with attempt to do the 3 minute stand.    MD is aware  Will continue to monitor

## 2021-10-16 DIAGNOSIS — I951 Orthostatic hypotension: Secondary | ICD-10-CM | POA: Diagnosis not present

## 2021-10-16 LAB — BASIC METABOLIC PANEL
Anion gap: 1 — ABNORMAL LOW (ref 5–15)
BUN: 14 mg/dL (ref 6–20)
CO2: 27 mmol/L (ref 22–32)
Calcium: 9.4 mg/dL (ref 8.9–10.3)
Chloride: 109 mmol/L (ref 98–111)
Creatinine, Ser: 0.65 mg/dL (ref 0.44–1.00)
GFR, Estimated: >60 mL/min (ref >60)
Glucose, Bld: 93 mg/dL (ref 70–99)
Potassium: 4 mmol/L (ref 3.5–5.1)
Sodium: 137 mmol/L (ref 135–145)

## 2021-10-16 LAB — MISC LABCORP TEST (SEND OUT)
LabCorp test name: 24
Labcorp test code: 4432

## 2021-10-16 NOTE — Progress Notes (Signed)
   10/16/21 1123  Assess: MEWS Score  BP (!) 77/61  MAP (mmHg) 69  Pulse Rate 65  SpO2 100 %  O2 Device Room Air  Assess: MEWS Score  MEWS Temp 0  MEWS Systolic 2  MEWS Pulse 0  MEWS RR 0  MEWS LOC 0  MEWS Score 2  MEWS Score Color Yellow  Assess: if the MEWS score is Yellow or Red  Were vital signs taken at a resting state? Yes  Focused Assessment Change from prior assessment (see assessment flowsheet)  Does the patient meet 2 or more of the SIRS criteria? No  MEWS guidelines implemented *See Row Information* Yes  Treat  MEWS Interventions Other (Comment) (pt has been hypotensive, Dr. Priscella Mann aware of low bp. will continue to monitor.)  Take Vital Signs  Increase Vital Sign Frequency  Yellow: Q 2hr X 2 then Q 4hr X 2, if remains yellow, continue Q 4hrs  Escalate  MEWS: Escalate Yellow: discuss with charge nurse/RN and consider discussing with provider and RRT  Notify: Charge Nurse/RN  Name of Charge Nurse/RN Notified Brittney B  Date Charge Nurse/RN Notified 10/16/21  Time Charge Nurse/RN Notified 55  Notify: Provider  Provider Name/Title Dr. Priscella Mann  Date Provider Notified 10/16/21  Method of Notification Face-to-face  Notification Reason Change in status  Provider response At bedside  Assess: SIRS CRITERIA  SIRS Temperature  0  SIRS Pulse 0  SIRS Respirations  0  SIRS WBC 1  SIRS Score Sum  1

## 2021-10-16 NOTE — Progress Notes (Signed)
PROGRESS NOTE    Ann Morales  KCL:[id number] DOB: 17-Aug-1975 DOA: 10/08/2021 PCP: Gwyneth Sprout, FNP    Brief Narrative:   46 year old female with bipolar disorder, asthma, obstructive sleep apnea may, sellar/suprasellar mass, migraines, dyslipidemia who presents to the hospital for dizziness.  He states the dizziness has been present for about 1 month. She tells me she has not seen her PCP in about 1 to 2 years however, there is a note in the chart from her PCP who evaluated her on 12/22 for "dizziness" and diagnosed her with vertigo.  I have discussed this with the patient.   An MRI was performed in the ED and is essentially unchanged regarding the sellar/suprasellar mass.  The patient was given 2-1/2 L of IV fluids and boluses and then started on 200 cc/h of normal saline along with midodrine. She was also noted to have a heart rate in the 40s and otherwise normal vital signs.   In regards to her psych medications, she was off her lithium and restarted about 3 weeks ago.  She continues to receive Invega injections monthly and last had one about 2 weeks ago.  She did run out of Klonopin and did not resume this  Remains orthostatic despite escalation in midodrine and florinef  Assessment & Plan:   Principal Problem:   Orthostatic hypotension Active Problems:   Vertigo   Bipolar disorder (HCC)   Obstructive sleep apnea   Asthma, chronic   Pituitary macroadenoma (HCC)   Lightheadedness  Orthostatic hypotension - The patient admits to feeling dizzy Despite adequate hydration, and TED stockings she continues to be quite orthostatic with symptoms of lightheadedness which she refers to as dizziness - appreciate psych eval-  Dr Weber Cooks does not feel [name] is the cause  - despite Increasing Midodrine to 10 mg TID, orthostatics now similar to what they were when she was first admitted 88/65> 52/41- HR increases from 63 to 93 -   ECHO shows grade 2 diastolic dysfunction and no  valvular issues - Given another liter of IVF on 8/8- no improvement Plan: Continue midodrine 10 TID Continue florinef 0.2 daily Encourage fluid intake, 2L daily   Sellar and suprasellar mass -  24-hour urine cortisol collected and results pending -  ACTH level normal   - Prolactin elevated at 83.0 - TFTs normal - note from 10/16/20> neurosurgeon, Dr. Lacinda Axon at [facility] recommended a full ophthalmological eval and surgical resection which she agreed with at the time-subsequent telephone note from 12/22 mentions that the patient canceled the surgery due to financial limitations and difficulty traveling back and forth  -- appreciate NS consult- no indications for surgery while in the hospital per NS - she will need her PCP to reffer hr to NS- she prefers [facility] for the proximity     Vertigo -Apparently was diagnosed in December with BPPV - I do notice a horizontal nystagmus when she looks to the right -Vestibular evaluation performed in the hospital is not suggestive of BPPV   Hypokalemia Monitor and replace as needed   Sinus bradycardia - per cardiology PA, no further work up is needed     Bipolar disorder ([place]) -She takes Invega IM -Lithium level is low at 0.25 despite taking Lithium - per psych, continue these meds -There are multiple other medications listed on her list which she states she is not taking.  These include Adderall, Wellbutrin, Latuda, Vyvanse,  Trintellix and Topamax     Obstructive sleep apnea -Continue CPAP  Asthma, chronic -No wheezing or dyspnea   DVT prophylaxis: Lovenox, ted hose Code Status: FULL Family Communication:daughter at bedside 8/9 Disposition Plan: Status is: Inpatient Remains inpatient appropriate because: Persistent symtomatic orthostasis   Level of care: Telemetry Cardiac  Consultants:  None  Procedures:  None  Antimicrobials: None    Subjective: Seen and examined.  NAD.  Complains of dizziness  Objective: Vitals:    10/16/21 1125 10/16/21 1133 10/16/21 1139 10/16/21 1443  BP: (!) 72/33 (!) 72/42 (!) 88/62 (!) 86/53  Pulse: 82 (!) 54  (!) 51  Resp:    17  Temp:      TempSrc:      SpO2: 98% 97%  98%  Weight:      Height:        Intake/Output Summary (Last 24 hours) at 10/16/2021 1453 Last data filed at 10/16/2021 1425 Gross per 24 hour  Intake 480 ml  Output 1150 ml  Net -670 ml   Filed Weights   10/08/21 1533  Weight: 75.3 kg    Examination:  General exam: Appears calm and comfortable  Respiratory system: Clear to auscultation. Respiratory effort normal. Cardiovascular system: S1S2, RRR, no murmur Gastrointestinal system: Abdomen is nondistended, soft and nontender. No organomegaly or masses felt. Normal bowel sounds heard. Central nervous system: Alert and oriented. No focal neurological deficits. Extremities: Symmetric 5 x 5 power. Skin: No rashes, lesions or ulcers Psychiatry: Judgement and insight appear normal. Mood & affect appropriate.     Data Reviewed: I have personally reviewed following labs and imaging studies  CBC: Recent Labs  Lab 10/12/21 0430 10/14/21 0413  WBC 8.9 10.0  HGB 13.6 12.9  HCT 42.1 38.4  MCV 85.7 85.5  PLT 182 409   Basic Metabolic Panel: Recent Labs  Lab 10/10/21 0745 10/12/21 0430 10/14/21 0413 10/15/21 0620 10/16/21 0554  NA 139 141 140 138 137  K 3.9 3.7 3.6 3.4* 4.0  CL 114* 105 108 106 109  CO2 '23 31 27 27 27  '$ GLUCOSE 87 99 88 92 93  BUN '7 13 14 14 14  '$ CREATININE 0.76 1.03* 0.77 0.65 0.65  CALCIUM 8.4* 8.8* 8.8* 9.0 9.4   GFR: Estimated Creatinine Clearance: 93.1 mL/min (by C-G formula based on SCr of 0.65 mg/dL). Liver Function Tests: No results for input(s): "AST", "ALT", "ALKPHOS", "BILITOT", "PROT", "ALBUMIN" in the last 168 hours. No results for input(s): "LIPASE", "AMYLASE" in the last 168 hours. No results for input(s): "AMMONIA" in the last 168 hours. Coagulation Profile: No results for input(s): "INR", "PROTIME" in  the last 168 hours. Cardiac Enzymes: No results for input(s): "CKTOTAL", "CKMB", "CKMBINDEX", "TROPONINI" in the last 168 hours. BNP (last 3 results) No results for input(s): "PROBNP" in the last 8760 hours. HbA1C: No results for input(s): "HGBA1C" in the last 72 hours. CBG: No results for input(s): "GLUCAP" in the last 168 hours. Lipid Profile: No results for input(s): "CHOL", "HDL", "LDLCALC", "TRIG", "CHOLHDL", "LDLDIRECT" in the last 72 hours. Thyroid Function Tests: No results for input(s): "TSH", "T4TOTAL", "FREET4", "T3FREE", "THYROIDAB" in the last 72 hours. Anemia Panel: No results for input(s): "VITAMINB12", "FOLATE", "FERRITIN", "TIBC", "IRON", "RETICCTPCT" in the last 72 hours. Sepsis Labs: No results for input(s): "PROCALCITON", "LATICACIDVEN" in the last 168 hours.  No results found for this or any previous visit (from the past 240 hour(s)).       Radiology Studies: No results found.      Scheduled Meds:  enoxaparin (LOVENOX) injection  40 mg Subcutaneous Q24H  fludrocortisone  0.2 mg Oral Daily   lithium carbonate  300 mg Oral BID   midodrine  10 mg Oral TID   montelukast  10 mg Oral QHS   Continuous Infusions:   LOS: 7 days      Sidney Ace, MD [facility]   If 7PM-7AM, please contact night-coverage  10/16/2021, 2:53 PM

## 2021-10-16 NOTE — Plan of Care (Signed)

## 2021-10-16 NOTE — Progress Notes (Signed)
Physical Therapy Treatment Patient Details Name: Ann Morales MRN: [id number] DOB: April 05, 1975 Today's Date: 10/16/2021   History of Present Illness Pt is a 46 y.o. Caucasian female with medical history significant for adult ADHD, bipolar obstructive sleep apnea on home CPAP, GERD, migraine, pituitary tumor and dyslipidemia who presented to the emergency room with acute onset of  dizziness which started about a month ago and has been getting significantly worse over the last 2 weeks with associated vertigo, as well as lightheadedness and occasional presyncope.    PT Comments    Pt was side lying in bed upon arriving. She is A and O x 4 and agreeable to session. Blood pressure; supine 87/61(69), sitting 98/70 (80), after ambulation 90/74. Pt was safely able to exit bed, stand, and ambulate without physical assistance. Does state she feels a little unsteady but no c/o dizziness or hypotensive symptoms. Pt would benefit from OP PT to address higher level balance deficits.   Recommendations for follow up therapy are one component of a multi-disciplinary discharge planning process, led by the attending physician.  Recommendations may be updated based on patient status, additional functional criteria and insurance authorization.  Follow Up Recommendations  Outpatient PT     Assistance Recommended at Discharge Frequent or constant Supervision/Assistance  Patient can return home with the following Assistance with cooking/housework;Assist for transportation;Help with stairs or ramp for entrance   Equipment Recommendations  Rolling walker (2 wheels)       Precautions / Restrictions Precautions Precautions: Fall Restrictions Weight Bearing Restrictions: No     Mobility  Bed Mobility Overal bed mobility: Modified Independent   Transfers Overall transfer level: Modified independent Equipment used: Rolling walker (2 wheels)     Ambulation/Gait Ambulation/Gait assistance:  Supervision Gait Distance (Feet): 200 Feet Assistive device: Rolling walker (2 wheels) Gait Pattern/deviations: WFL(Within Functional Limits)       Balance Overall balance assessment: Needs assistance Sitting-balance support: Feet supported Sitting balance-Leahy Scale: Good     Standing balance support: Bilateral upper extremity supported, During functional activity Standing balance-Leahy Scale: Good     Cognition Arousal/Alertness: Awake/alert Behavior During Therapy: WFL for tasks assessed/performed Overall Cognitive Status: Within Functional Limits for tasks assessed             Pertinent Vitals/Pain Pain Assessment Pain Assessment: No/denies pain     PT Goals (current goals can now be found in the care plan section) Acute Rehab PT Goals Patient Stated Goal: go home tomorrow Progress towards PT goals: Progressing toward goals    Frequency    Min 2X/week      PT Plan Current plan remains appropriate       AM-PAC PT "6 Clicks" Mobility   Outcome Measure  Help needed turning from your back to your side while in a flat bed without using bedrails?: None Help needed moving from lying on your back to sitting on the side of a flat bed without using bedrails?: None Help needed moving to and from a bed to a chair (including a wheelchair)?: None Help needed standing up from a chair using your arms (e.g., wheelchair or bedside chair)?: None Help needed to walk in hospital room?: A Little Help needed climbing 3-5 steps with a railing? : A Little 6 Click Score: 22    End of Session   Activity Tolerance: Patient tolerated treatment well Patient left: in bed;with call bell/phone within reach;with bed alarm set Nurse Communication: Mobility status PT Visit Diagnosis: Other abnormalities of gait and mobility (R26.89)  Time: 9485-4627 PT Time Calculation (min) (ACUTE ONLY): 28 min  Charges:  $Gait Training: 8-22 mins $Therapeutic Activity: 8-22 mins                      Julaine Fusi PTA 10/16/21, 4:54 PM

## 2021-10-17 DIAGNOSIS — I951 Orthostatic hypotension: Secondary | ICD-10-CM | POA: Diagnosis not present

## 2021-10-17 MED ORDER — FLUDROCORTISONE ACETATE 0.1 MG PO TABS: 0.2000 mg | ORAL_TABLET | Freq: Every day | ORAL | 0 refills | Status: AC

## 2021-10-17 MED ORDER — MIDODRINE HCL 10 MG PO TABS: 10.0000 mg | ORAL_TABLET | Freq: Three times a day (TID) | ORAL | 0 refills | Status: AC

## 2021-10-17 NOTE — Plan of Care (Signed)

## 2021-10-17 NOTE — TOC Transition Note (Signed)
Transition of Care [facility]) - CM/SW Discharge Note   Patient Details  Name: Ann Morales MRN: [id number] Date of Birth: 08/01/1975  [facility] CM/SW Contact:  Alberteen Sam, LCSW Phone Number: 10/17/2021, 10:56 AM   Clinical Narrative:     Patient to discharge home today has ride home, reports she does not feel like she needs outpatient PT at this time. She does request rolling walker to be delivered to hospital room prior to dc, this has been ordered via Suanne Marker with [employer].   No further discharge needs identified.    Final next level of care: Home/Self Care Barriers to Discharge: No Barriers Identified   Patient Goals and CMS Choice Patient states their goals for this hospitalization and ongoing recovery are:: to go home CMS Medicare.gov Compare Post Acute Care list provided to:: Patient Choice offered to / list presented to : Patient  Discharge Placement                       Discharge Plan and Services                                     Social Determinants of Health (SDOH) Interventions     Readmission Risk Interventions     No data to display

## 2021-10-17 NOTE — Discharge Summary (Signed)
Physician Discharge Summary  Ann Morales PNT:[id number] DOB: Mar 07, 1976 DOA: 10/08/2021  PCP: Gwyneth Sprout, FNP  Admit date: 10/08/2021 Discharge date: 10/17/2021  Admitted From: Home Disposition: Home with outpatient PT referral  Recommendations for Outpatient Follow-up:  Follow up with PCP in 1-2 weeks Follow-up with outpatient neurosurgery in [facility]: None Equipment/Devices: None  Discharge Condition: Stable CODE STATUS: Full Diet recommendation: Regular, greater than 2 L water intake daily  Brief/Interim Summary:  46 year old female with bipolar disorder, asthma, obstructive sleep apnea may, sellar/suprasellar mass, migraines, dyslipidemia who presents to the hospital for dizziness.  He states the dizziness has been present for about 1 month. She tells me she has not seen her PCP in about 1 to 2 years however, there is a note in the chart from her PCP who evaluated her on 12/22 for "dizziness" and diagnosed her with vertigo.  I have discussed this with the patient.   An MRI was performed in the ED and is essentially unchanged regarding the sellar/suprasellar mass.  The patient was given 2-1/2 L of IV fluids and boluses and then started on 200 cc/h of normal saline along with midodrine. She was also noted to have a heart rate in the 40s and otherwise normal vital signs.   In regards to her psych medications, she was off her lithium and restarted about 3 weeks ago.  She continues to receive Invega injections monthly and last had one about 2 weeks ago.  She did run out of Klonopin and did not resume this   Remains orthostatic despite escalation in midodrine and florinef.  Symptoms much improved on 8/10.  Ambulated with physical therapy including steroid therapy.  Did well.  Outpatient PT follow-up recommended.    Discharge Diagnoses:  Principal Problem:   Orthostatic hypotension Active Problems:   Vertigo   Bipolar disorder (HCC)   Obstructive sleep apnea    Asthma, chronic   Pituitary macroadenoma (HCC)   Lightheadedness  Orthostatic hypotension Patient presented with dizziness exacerbated by changing positions.  Patient was aggressively hydrated, TED stockings, abdominal binder.  Dizziness persisted.  Psychiatry did not feel this medication was the cause of her orthostasis.  Patient was escalated to midodrine 10 mg 3 times daily and Florinef 0.2 mg daily.  Encouraged to drink at least 2 L of water daily.  Water intake is been challenging in this patient.  On day of discharge patient was much improved.  By criteria she is still orthostatic but much less symptomatic.  Will clear for discharge at this time.  Continue midodrine 10 mg 3 times daily, continue Florinef 0.2 mg daily.  Encourage p.o. fluid intake.  Greater than 2 L of fluid a day.  Follow-up outpatient PCP.  Sellar and suprasellar mass Patient was previously recommended surgical intervention however due to financial limitations and difficulty of travel this did not happen.  Neurosurgery consulted in house.  No indication for urgent surgery while in the hospital.  Patient will need a referral from PCP to follow-up with neurosurgery in [place].  Discharge Instructions  Discharge Instructions     Ambulatory referral to Physical Therapy   Complete by: As directed    Diet - low sodium heart healthy   Complete by: As directed    Increase activity slowly   Complete by: As directed       Allergies as of 10/17/2021       Reactions   Codeine Hives, Shortness Of Breath   Azithromycin Diarrhea   Calamine-zinc  Oxide Itching   Doxycycline Nausea And Vomiting   Sulfa Antibiotics Other (See Comments)   Severe abdominal pain.   Zinc Other (See Comments)   Burning sensation        Medication List     STOP taking these medications    AeroChamber MV inhaler   montelukast 10 MG tablet Commonly known as: SINGULAIR       TAKE these medications    fludrocortisone 0.1 MG  tablet Commonly known as: FLORINEF Take 2 tablets (0.2 mg total) by mouth daily.   [name] Sustenna 234 MG/1.5ML injection Generic drug: paliperidone Inject into the muscle.   lithium 300 MG tablet Take 300 mg by mouth 2 (two) times daily.   midodrine 10 MG tablet Commonly known as: PROAMATINE Take 1 tablet (10 mg total) by mouth 3 (three) times daily.   SUMAtriptan 50 MG tablet Commonly known as: IMITREX May repeat in 2 hours if headache persists or recurs.   traZODone 100 MG tablet Commonly known as: DESYREL Take 100 mg by mouth at bedtime.               Durable Medical Equipment  (From admission, onward)           Start     Ordered   10/17/21 1057  For home use only DME Walker rolling  Once       Question Answer Comment  Walker: With 5 Inch Wheels   Patient needs a walker to treat with the following condition Weakness      10/17/21 1056            Allergies  Allergen Reactions   Codeine Hives and Shortness Of Breath   Azithromycin Diarrhea   Calamine-Zinc Oxide Itching   Doxycycline Nausea And Vomiting   Sulfa Antibiotics Other (See Comments)    Severe abdominal pain.   Zinc Other (See Comments)    Burning sensation    Consultations: Neurosurgery   Procedures/Studies: ECHOCARDIOGRAM COMPLETE  Result Date: 10/14/2021    ECHOCARDIOGRAM REPORT   Patient Name:   Ann Morales Date of Exam: 10/14/2021 Medical Rec #:  [id number]          Height:       67.0 in Accession #:    [id number]         Weight:       166.0 lb Date of Birth:  03-Nov-1975          BSA:          1.868 m Patient Age:    46 years           BP:           97/59 mmHg Patient Gender: F                  HR:           52 bpm. Exam Location:  [facility] Procedure: 2D Echo, Cardiac Doppler and Color Doppler Indications:     458.0 Orthostatic Hypotension  History:         Patient has no prior history of Echocardiogram examinations.                  Risk Factors:Sleep Apnea. Anxiety, covid-19.   Sonographer:     Sherrie Sport Referring Phys:  [id number] Debbe Odea Diagnosing Phys: Neoma Laming  Sonographer Comments: Suboptimal apical window. IMPRESSIONS  1. Left ventricular ejection fraction, by estimation, is 60 to 65%. The left ventricle has normal function.  The left ventricle has no regional wall motion abnormalities. There is moderate concentric left ventricular hypertrophy. Left ventricular diastolic parameters are consistent with Grade II diastolic dysfunction (pseudonormalization).  2. Right ventricular systolic function is normal. The right ventricular size is normal.  3. The mitral valve is normal in structure. Trivial mitral valve regurgitation. No evidence of mitral stenosis.  4. The aortic valve is normal in structure. Aortic valve regurgitation is not visualized. Aortic valve sclerosis is present, with no evidence of aortic valve stenosis.  5. The inferior vena cava is normal in size with greater than 50% respiratory variability, suggesting right atrial pressure of 3 mmHg. FINDINGS  Left Ventricle: Left ventricular ejection fraction, by estimation, is 60 to 65%. The left ventricle has normal function. The left ventricle has no regional wall motion abnormalities. The left ventricular internal cavity size was normal in size. There is  moderate concentric left ventricular hypertrophy. Left ventricular diastolic parameters are consistent with Grade II diastolic dysfunction (pseudonormalization). Right Ventricle: The right ventricular size is normal. No increase in right ventricular wall thickness. Right ventricular systolic function is normal. Left Atrium: Left atrial size was normal in size. Right Atrium: Right atrial size was normal in size. Pericardium: There is no evidence of pericardial effusion. Mitral Valve: The mitral valve is normal in structure. Trivial mitral valve regurgitation. No evidence of mitral valve stenosis. Tricuspid Valve: The tricuspid valve is normal in structure. Tricuspid valve  regurgitation is trivial. No evidence of tricuspid stenosis. Aortic Valve: The aortic valve is normal in structure. Aortic valve regurgitation is not visualized. Aortic valve sclerosis is present, with no evidence of aortic valve stenosis. Aortic valve mean gradient measures 2.0 mmHg. Aortic valve peak gradient measures 3.8 mmHg. Aortic valve area, by VTI measures 3.12 cm. Pulmonic Valve: The pulmonic valve was normal in structure. Pulmonic valve regurgitation is not visualized. No evidence of pulmonic stenosis. Aorta: The aortic root is normal in size and structure. Venous: The inferior vena cava is normal in size with greater than 50% respiratory variability, suggesting right atrial pressure of 3 mmHg. IAS/Shunts: No atrial level shunt detected by color flow Doppler.  LEFT VENTRICLE PLAX 2D LVIDd:         4.80 cm   Diastology LVIDs:         3.20 cm   LV e' medial:    4.03 cm/s LV PW:         1.00 cm   LV E/e' medial:  21.3 LV IVS:        0.90 cm   LV e' lateral:   8.49 cm/s LVOT diam:     2.10 cm   LV E/e' lateral: 10.1 LV SV:         65 LV SV Index:   35 LVOT Area:     3.46 cm  RIGHT VENTRICLE RV Basal diam:  2.70 cm RV S prime:     12.70 cm/s TAPSE (M-mode): 3.0 cm LEFT ATRIUM             Index        RIGHT ATRIUM           Index LA diam:        3.50 cm 1.87 cm/m   RA Area:     11.40 cm LA Vol (A2C):   50.5 ml 27.03 ml/m  RA Volume:   25.50 ml  13.65 ml/m LA Vol (A4C):   33.0 ml 17.66 ml/m LA Biplane Vol: 43.6 ml 23.34 ml/m  AORTIC VALVE AV Area (Vmax):    2.92 cm AV Area (Vmean):   2.93 cm AV Area (VTI):     3.12 cm AV Vmax:           97.60 cm/s AV Vmean:          66.000 cm/s AV VTI:            0.209 m AV Peak Grad:      3.8 mmHg AV Mean Grad:      2.0 mmHg LVOT Vmax:         82.20 cm/s LVOT Vmean:        55.800 cm/s LVOT VTI:          0.188 m LVOT/AV VTI ratio: 0.90  AORTA Ao Root diam: 3.70 cm MITRAL VALVE               TRICUSPID VALVE MV Area (PHT): 4.80 cm    TR Peak grad:   11.6 mmHg MV Decel  Time: 158 msec    TR Vmax:        170.00 cm/s MV E velocity: 85.70 cm/s MV A velocity: 68.10 cm/s  SHUNTS MV E/A ratio:  1.26        Systemic VTI:  0.19 m                            Systemic Diam: 2.10 cm Neoma Laming Electronically signed by Neoma Laming Signature Date/Time: 10/14/2021/9:20:52 AM    Final    MR BRAIN WO CONTRAST  Result Date: 10/09/2021 CLINICAL DATA:  Vertigo EXAM: MRI HEAD WITHOUT CONTRAST TECHNIQUE: Multiplanar, multiecho pulse sequences of the brain and surrounding structures were obtained without intravenous contrast. COMPARISON:  07/09/2020 brain MRI FINDINGS: Brain: No acute infarct, mass effect or extra-axial collection. No acute or chronic hemorrhage. Normal white matter signal, parenchymal volume and CSF spaces. The midline structures are normal. 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 Vascular: Major flow voids are preserved. Skull and upper cervical spine: Normal calvarium and skull base. Visualized upper cervical spine and soft tissues are normal. Sinuses/Orbits:No paranasal sinus fluid levels or advanced mucosal thickening. No mastoid or middle ear effusion. Normal orbits. IMPRESSION: 1. No acute intracranial abnormality. 2. 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 Electronically Signed   By: Ulyses Jarred M.D.   On: 10/09/2021 01:32   CT Head Wo Contrast  Result Date: 10/08/2021 CLINICAL DATA:  Dizziness EXAM: CT HEAD WITHOUT CONTRAST TECHNIQUE: Contiguous axial images were obtained from the base of the skull through the vertex without intravenous contrast. RADIATION DOSE REDUCTION: This exam was performed according to the departmental dose-optimization program which includes automated exposure control, adjustment of the mA and/or kV according to patient size and/or use of iterative reconstruction technique. COMPARISON:  MR brain done on 07/09/2020 FINDINGS: Brain: No acute  intracranial findings are seen. There are no signs of bleeding within the cranium. Ventricles are not dilated. 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 This finding has not changed significantly since previous MRI done on 07/09/2020. There is thinning of cortical margin in the anterior aspect of sella. Vascular: Unremarkable. Skull: No acute findings are seen. Sinuses/Orbits: Unremarkable. Other: None. IMPRESSION: No acute intracranial findings are seen in noncontrast CT brain. There are no signs of  bleeding within the cranium. Ventricles are not dilated. 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 Similar finding was seen in previous MRI done on 07/09/2020. There is thinning of anterior cortical margin of sella, possibly due to remodeling caused by pituitary macro adenoma or possible early extension of the adenoma into the sinus. Electronically Signed   By: Elmer Picker M.D.   On: 10/08/2021 15:55      Subjective: Seen and examined day of discharge.  Symptoms improved.  Stable for discharge home.  Discharge Exam: Vitals:   10/17/21 0827 10/17/21 1144  BP: 97/63 (!) 92/58  Pulse: 75 (!) 52  Resp:  17  Temp:  98.7 F (37.1 C)  SpO2:  97%   Vitals:   10/17/21 0758 10/17/21 0803 10/17/21 0827 10/17/21 1144  BP: (!) 151/127 97/64 97/63 (!) 92/58  Pulse: 60 (!) 53 75 (!) 52  Resp: 17   17  Temp: 98.6 F (37 C)   98.7 F (37.1 C)  TempSrc: Oral   Oral  SpO2: 100%   97%  Weight:      Height:        General: Pt is alert, awake, not in acute distress Cardiovascular: RRR, S1/S2 +, no rubs, no gallops Respiratory: CTA bilaterally, no wheezing, no rhonchi Abdominal: Soft, NT, ND, bowel sounds + Extremities: no edema, no cyanosis    The  results of significant diagnostics from this hospitalization (including imaging, microbiology, ancillary and laboratory) are listed below for reference.     Microbiology: No results found for this or any previous visit (from the past 240 hour(s)).   Labs: BNP (last 3 results) No results for input(s): "BNP" in the last 8760 hours. Basic Metabolic Panel: Recent Labs  Lab 10/12/21 0430 10/14/21 0413 10/15/21 0620 10/16/21 0554  NA 141 140 138 137  K 3.7 3.6 3.4* 4.0  CL 105 108 106 109  CO2 '31 27 27 27  '$ GLUCOSE 99 88 92 93  BUN '13 14 14 14  '$ CREATININE 1.03* 0.77 0.65 0.65  CALCIUM 8.8* 8.8* 9.0 9.4   Liver Function Tests: No results for input(s): "AST", "ALT", "ALKPHOS", "BILITOT", "PROT", "ALBUMIN" in the last 168 hours. No results for input(s): "LIPASE", "AMYLASE" in the last 168 hours. No results for input(s): "AMMONIA" in the last 168 hours. CBC: Recent Labs  Lab 10/12/21 0430 10/14/21 0413  WBC 8.9 10.0  HGB 13.6 12.9  HCT 42.1 38.4  MCV 85.7 85.5  PLT 182 176   Cardiac Enzymes: No results for input(s): "CKTOTAL", "CKMB", "CKMBINDEX", "TROPONINI" in the last 168 hours. BNP: Invalid input(s): "POCBNP" CBG: No results for input(s): "GLUCAP" in the last 168 hours. D-Dimer No results for input(s): "DDIMER" in the last 72 hours. Hgb A1c No results for input(s): "HGBA1C" in the last 72 hours. Lipid Profile No results for input(s): "CHOL", "HDL", "LDLCALC", "TRIG", "CHOLHDL", "LDLDIRECT" in the last 72 hours. Thyroid function studies No results for input(s): "TSH", "T4TOTAL", "T3FREE", "THYROIDAB" in the last 72 hours.  Invalid input(s): "FREET3" Anemia work up No results for input(s): "VITAMINB12", "FOLATE", "FERRITIN", "TIBC", "IRON", "RETICCTPCT" in the last 72 hours. Urinalysis    Component Value Date/Time   COLORURINE YELLOW 11/14/2018 1505   APPEARANCEUR CLOUDY (A) 11/14/2018 1505   APPEARANCEUR Hazy 05/28/2013 1001   LABSPEC >1.030 (H) 11/14/2018 1505    LABSPEC 1.015 05/28/2013 1001   PHURINE 5.0 11/14/2018 1505   GLUCOSEU NEGATIVE 11/14/2018  1505   GLUCOSEU Negative 05/28/2013 1001   HGBUR MODERATE (A) 11/14/2018 1505   BILIRUBINUR NEGATIVE 11/14/2018 1505   BILIRUBINUR negative 03/09/2017 0958   BILIRUBINUR Negative 05/28/2013 1001   KETONESUR NEGATIVE 11/14/2018 1505   PROTEINUR TRACE (A) 11/14/2018 1505   UROBILINOGEN 0.2 03/09/2017 0958   NITRITE NEGATIVE 11/14/2018 1505   LEUKOCYTESUR SMALL (A) 11/14/2018 1505   LEUKOCYTESUR Trace 05/28/2013 1001   Sepsis Labs Recent Labs  Lab 10/12/21 0430 10/14/21 0413  WBC 8.9 10.0   Microbiology No results found for this or any previous visit (from the past 240 hour(s)).   Time coordinating discharge: Over 30 minutes  SIGNED:   Sidney Ace, MD  [facility] 10/17/2021, 2:26 PM Pager   If 7PM-7AM, please contact night-coverage

## 2021-10-17 NOTE — Progress Notes (Signed)
Ann Morales to be D/C'd Home per MD order.  Discussed prescriptions and follow up appointments with the patient. Prescriptions given to patient, medication list explained in detail. Pt verbalized understanding.  Allergies as of 10/17/2021       Reactions   Codeine Hives, Shortness Of Breath   Azithromycin Diarrhea   Calamine-zinc Oxide Itching   Doxycycline Nausea And Vomiting   Sulfa Antibiotics Other (See Comments)   Severe abdominal pain.   Zinc Other (See Comments)   Burning sensation        Medication List     STOP taking these medications    AeroChamber MV inhaler   montelukast 10 MG tablet Commonly known as: SINGULAIR       TAKE these medications    fludrocortisone 0.1 MG tablet Commonly known as: FLORINEF Take 2 tablets (0.2 mg total) by mouth daily.   Ann Morales Sustenna 234 MG/1.5ML injection Generic drug: paliperidone Inject into the muscle.   lithium 300 MG tablet Take 300 mg by mouth 2 (two) times daily.   midodrine 10 MG tablet Commonly known as: PROAMATINE Take 1 tablet (10 mg total) by mouth 3 (three) times daily.   SUMAtriptan 50 MG tablet Commonly known as: IMITREX May repeat in 2 hours if headache persists or recurs.   traZODone 100 MG tablet Commonly known as: DESYREL Take 100 mg by mouth at bedtime.               Durable Medical Equipment  (From admission, onward)           Start     Ordered   10/17/21 1057  For home use only DME Walker rolling  Once       Question Answer Comment  Walker: With 5 Inch Wheels   Patient needs a walker to treat with the following condition Weakness      10/17/21 1056            Vitals:   10/17/21 0827 10/17/21 1144  BP: 97/63 (!) 92/58  Pulse: 75 (!) 52  Resp:  17  Temp:  98.7 F (37.1 C)  SpO2:  97%    Tele box removed and returned. Skin clean, dry and intact without evidence of skin break down, no evidence of skin tears noted. IV catheter discontinued intact. Site  without signs and symptoms of complications. Dressing and pressure applied. Pt denies pain at this time. No complaints noted.  An After Visit Summary was printed and given to the patient. Patient escorted via [place], and D/C home via private auto.  Ann Morales

## 2021-10-17 NOTE — Progress Notes (Signed)
Physical Therapy Treatment Patient Details Name: Ann Morales MRN: [id number] DOB: [birth year] Today's Date: 10/17/2021   History of Present Illness Pt is a 46 y.o. Caucasian female with medical history significant for adult ADHD, bipolar obstructive sleep apnea on home CPAP, GERD, migraine, pituitary tumor and dyslipidemia who presented to the emergency room with acute onset of  dizziness which started about a month ago and has been getting significantly worse over the last 2 weeks with associated vertigo, as well as lightheadedness and occasional presyncope.    PT Comments    Pt was supine in bed upon arriving. She reports she had just been up walking with RN tech ~ 30 minutes prior. Does agree to OOB activity again. BP remains low however pt was asymptomatic throughout. Easily and safely able to exit bed, stand, and ambulate with use of RW. Pt performed stairs without safety concern. Will have available assistance at DC and would benefit from continued PT as an outpatient to maximize independence while improving higher level balance deficits.   Recommendations for follow up therapy are one component of a multi-disciplinary discharge planning process, led by the attending physician.  Recommendations may be updated based on patient status, additional functional criteria and insurance authorization.  Follow Up Recommendations  Outpatient PT     Assistance Recommended at Discharge Frequent or constant Supervision/Assistance  Patient can return home with the following Assistance with cooking/housework;Assist for transportation;Help with stairs or ramp for entrance   Equipment Recommendations  Rolling walker (2 wheels)       Precautions / Restrictions Precautions Precautions: Fall Restrictions Weight Bearing Restrictions: No     Mobility  Bed Mobility Overal bed mobility: Modified Independent   Transfers Overall transfer level: Modified independent Equipment used: Rolling  walker (2 wheels)     Ambulation/Gait Ambulation/Gait assistance: Modified independent (Device/Increase time) Gait Distance (Feet): 300 Feet Assistive device: Rolling walker (2 wheels) Gait Pattern/deviations: WFL(Within Functional Limits) Gait velocity: WNL     General Gait Details: pt demonstrated safe ability to ambulate with RW without difficulty or significant concerns   Stairs Stairs: Yes  General stair comments: pt easily was able to ascend/descend 2 stair without difficulty or safety concern     Balance Overall balance assessment: Needs assistance Sitting-balance support: Feet supported Sitting balance-Leahy Scale: Good     Standing balance support: Bilateral upper extremity supported, During functional activity Standing balance-Leahy Scale: Good     Cognition Arousal/Alertness: Awake/alert Behavior During Therapy: Flat affect, WFL for tasks assessed/performed Overall Cognitive Status: Within Functional Limits for tasks assessed                Pertinent Vitals/Pain Pain Assessment Pain Assessment: No/denies pain     PT Goals (current goals can now be found in the care plan section) Acute Rehab PT Goals Patient Stated Goal: go home Progress towards PT goals: Progressing toward goals    Frequency    Min 2X/week      PT Plan Current plan remains appropriate       AM-PAC PT "6 Clicks" Mobility   Outcome Measure  Help needed turning from your back to your side while in a flat bed without using bedrails?: None Help needed moving from lying on your back to sitting on the side of a flat bed without using bedrails?: None Help needed moving to and from a bed to a chair (including a wheelchair)?: None Help needed standing up from a chair using your arms (e.g., wheelchair or bedside chair)?: None Help  needed to walk in hospital room?: A Little Help needed climbing 3-5 steps with a railing? : A Little 6 Click Score: 22    End of Session   Activity  Tolerance: Patient tolerated treatment well Patient left: in bed;with call bell/phone within reach;with bed alarm set Nurse Communication: Mobility status PT Visit Diagnosis: Other abnormalities of gait and mobility (R26.89)     Time: 0263-7858 PT Time Calculation (min) (ACUTE ONLY): 17 min  Charges:  $Gait Training: 8-22 mins                     Julaine Fusi PTA 10/17/21, 10:03 AM

## 2021-10-22 ENCOUNTER — Telehealth: Payer: Self-pay

## 2021-10-22 NOTE — Telephone Encounter (Signed)
Attempted to reach pt, number not in service.

## 2021-10-22 NOTE — Telephone Encounter (Signed)
-----  Message from Gwyneth Sprout, FNP sent at 10/22/2021  1:16 PM EDT ----- Regarding: FW: PT referral Thanks, LD.  OK for orders for outpatient PT; however, patient is not known to me and appears to have poor compliance from the last provider she has seen within the year. Needs to have a virtual/office visit to establish referrals.  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 ----- Message ----- From: Gwyneth Sprout, FNP Sent: 10/22/2021   1:11 PM EDT To: Mikey Kirschner, PA-C Subject: FW: PT referral                                I've never met this patient; however, she is assigned to me as PCP. What are your thoughts on this given you have met the patient?  Thanks, Gwyneth Sprout, [address] [place], [place] [address] [phone] (phone) [phone] (fax) [place]  ----- Message ----- From: Ian Bushman Sent: 10/22/2021  12:23 PM EDT To: Gwyneth Sprout, FNP Subject: PT referral                                    Good afternoon!  This patient was recently d/c from [facility] with recommendations to continue with outpatient PT.  If you are in agreeance can you please enter a PT referral for [place] for dx: M62.81  Thanks! [facility] [place] [facility] [phone]

## 2021-10-23 NOTE — Telephone Encounter (Signed)
Tried calling patient again. Number is not in service. Tried calling significant other, no answer and VM is not set up.

## 2022-01-06 ENCOUNTER — Encounter (INDEPENDENT_AMBULATORY_CARE_PROVIDER_SITE_OTHER): Payer: Self-pay

## 2022-11-20 ENCOUNTER — Emergency Department
Admission: EM | Admit: 2022-11-20 | Discharge: 2022-11-20 | Disposition: A | Discharge status: Home / Self Care | Payer: BC Managed Care – PPO | Attending: Emergency Medicine | Admitting: Emergency Medicine

## 2022-11-20 ENCOUNTER — Encounter: Payer: Self-pay | Admitting: *Deleted

## 2022-11-20 ENCOUNTER — Other Ambulatory Visit: Payer: Self-pay

## 2022-11-20 ENCOUNTER — Emergency Department: Payer: BC Managed Care – PPO

## 2022-11-20 DIAGNOSIS — S29012A Strain of muscle and tendon of back wall of thorax, initial encounter: Secondary | ICD-10-CM | POA: Insufficient documentation

## 2022-11-20 DIAGNOSIS — X500XXA Overexertion from strenuous movement or load, initial encounter: Secondary | ICD-10-CM | POA: Insufficient documentation

## 2022-11-20 DIAGNOSIS — M546 Pain in thoracic spine: Secondary | ICD-10-CM | POA: Diagnosis not present

## 2022-11-20 MED ORDER — OXYCODONE-ACETAMINOPHEN 5-325 MG PO TABS
1.0000 | ORAL_TABLET | Freq: Once | ORAL | Status: AC
  Administered 2022-11-20: 1 via ORAL
  Filled 2022-11-20: qty 1

## 2022-11-20 MED ORDER — OXYCODONE HCL 5 MG PO TABS: 2.5000 mg | ORAL_TABLET | Freq: Three times a day (TID) | ORAL | 0 refills | Status: DC | PRN

## 2022-11-20 MED ORDER — CYCLOBENZAPRINE HCL 5 MG PO TABS: 5.0000 mg | ORAL_TABLET | Freq: Three times a day (TID) | ORAL | 0 refills | Status: DC | PRN

## 2022-11-20 NOTE — ED Triage Notes (Signed)
Pt has mid back pain for 2 weeks .  Pt reports picking up a heavy box.  Pt taking otc meds without relief.  Pt alert.

## 2022-11-20 NOTE — Discharge Instructions (Addendum)
Take medications as prescribed.  Use heating pad as needed for additional relief.  Avoid any heavy lifting pushing or pulling.  Follow-up with primary care provider if no improvement in the next few days.  Return to the ER for any worsening symptoms or any urgent changes in your health.

## 2022-11-20 NOTE — ED Provider Notes (Addendum)
[facility] EMERGENCY DEPARTMENT AT [facility] Provider Note   CSN: [id number] Arrival date & time: 11/20/22  1942     History  Chief Complaint  Patient presents with   Back Pain    Ann Morales is a 47 y.o. female.  Presents to the emergency department for evaluation of acute midline back pain.  Patient states she had been doing some heavy lifting over the last couple weeks.  Over last couple days she has had severe pain in the middle of her thoracic spine with muscle tightness and pain.  Patient denies any radiculopathy, chest pain, shortness of breath.  Pain is in the middle of her thoracic spine.  Pain worse with activity.  She has not had any relief with over-the-counter medications.  HPI     Home Medications Prior to Admission medications   Medication Sig Start Date End Date Taking? Authorizing Provider  cyclobenzaprine (FLEXERIL) 5 MG tablet Take 1-2 tablets (5-10 mg total) by mouth 3 (three) times daily as needed. 11/20/22  Yes Evon Slack, PA-C  oxyCODONE (ROXICODONE) 5 MG immediate release tablet Take 0.5 tablets (2.5 mg total) by mouth every 8 (eight) hours as needed. 11/20/22 11/20/23 Yes Evon Slack, PA-C  INVEGA SUSTENNA 234 MG/1.5ML SUSY injection Inject into the muscle. 02/28/20   [provider]  lithium 300 MG tablet Take 300 mg by mouth 2 (two) times daily. 05/04/17   [provider]  SUMAtriptan (IMITREX) 50 MG tablet May repeat in 2 hours if headache persists or recurs. Patient not taking: Reported on 10/08/2021 07/24/20   Chrismon, Jodell Cipro, PA-C  traZODone (DESYREL) 100 MG tablet Take 100 mg by mouth at bedtime.    [provider]  mirabegron ER (MYRBETRIQ) 25 MG TB24 tablet Take 1 tablet (25 mg total) by mouth daily. 04/25/19 01/19/20  Trey Sailors, PA-C      Allergies    Codeine, Azithromycin, Calamine-zinc oxide, Doxycycline, Sulfa antibiotics, and Zinc    Review of Systems   Review of Systems  Physical  Exam Updated Vital Signs BP (!) 148/96 (BP Location: Left Arm)   Pulse 100   Temp 98.1 F (36.7 C) (Oral)   Resp 18   Ht 5\' 7"  (1.702 m)   Wt 81.6 kg   SpO2 96%   BMI 28.19 kg/m  Physical Exam Constitutional:      Appearance: She is well-developed.  HENT:     Head: Normocephalic and atraumatic.  Eyes:     Conjunctiva/sclera: Conjunctivae normal.  Cardiovascular:     Rate and Rhythm: Normal rate.  Pulmonary:     Effort: Pulmonary effort is normal. No respiratory distress.  Musculoskeletal:        General: Normal range of motion.     Cervical back: Normal range of motion.     Comments: Pain with forward flexion of the thoracic spine.  She is nontender along the cervical or lumbar spine but does have some midline thoracic tenderness.  There is no paravertebral muscle tenderness along the cervical or lumbar spine but she does have some left mid scapular border muscle tender and is bilaterally.  She is nontender throughout the ribs bilaterally.  She has no sternal tenderness.  No swelling or bruising throughout the back.  Skin:    General: Skin is warm.     Findings: No rash.  Neurological:     General: No focal deficit present.     Mental Status: She is alert and oriented to person,  place, and time.  Psychiatric:        Behavior: Behavior normal.        Thought Content: Thought content normal.     ED Results / Procedures / Treatments   Labs (all labs ordered are listed, but only abnormal results are displayed) Labs Reviewed - No data to display  EKG None  Radiology No results found.  Procedures Procedures    Medications Ordered in ED Medications  oxyCODONE-acetaminophen (PERCOCET/ROXICET) 5-325 MG per tablet 1 tablet (1 tablet Oral Given 11/20/22 2213)    ED Course/ Medical Decision Making/ A&P                                 Medical Decision Making Amount and/or Complexity of Data Reviewed Radiology: ordered.  Risk Prescription drug  management.   47 year old female with thoracic back strain.  X-ray of the thoracic spine ordered and independently reviewed by me today shows no evidence of compression fracture, acute bony abnormality, rib fracture or pneumothorax.  Patient's vital signs are stable.  She is afebrile.  She is given some pain medication and muscle relaxers to take.  She is encouraged to use a heating pad.  She is given strict return precautions and she understands signs symptoms to return to the ER for. Final Clinical Impression(s) / ED Diagnoses Final diagnoses:  Strain of thoracic back region    Rx / DC Orders ED Discharge Orders          Ordered    cyclobenzaprine (FLEXERIL) 5 MG tablet  3 times daily PRN        11/20/22 2252    oxyCODONE (ROXICODONE) 5 MG immediate release tablet  Every 8 hours PRN        11/20/22 2252              Evon Slack, PA-C 11/20/22 2255    Evon Slack, PA-C 11/20/22 2255    Phineas Semen, MD 11/21/22 0006

## 2022-11-20 NOTE — ED Notes (Signed)
Patient verbalizes understanding of discharge instructions. Opportunity for questioning and answers were provided. Armband removed by staff, pt discharged from ED. Ambulated out to lobby with husband 

## 2022-12-01 ENCOUNTER — Other Ambulatory Visit: Payer: Self-pay

## 2022-12-01 ENCOUNTER — Emergency Department
Admission: EM | Admit: 2022-12-01 | Discharge: 2022-12-02 | Discharge status: Against Medical Advice | Payer: BC Managed Care – PPO | Attending: Emergency Medicine | Admitting: Emergency Medicine

## 2022-12-01 DIAGNOSIS — M546 Pain in thoracic spine: Secondary | ICD-10-CM | POA: Diagnosis not present

## 2022-12-01 DIAGNOSIS — Z5321 Procedure and treatment not carried out due to patient leaving prior to being seen by health care provider: Secondary | ICD-10-CM | POA: Diagnosis not present

## 2022-12-01 NOTE — ED Triage Notes (Signed)
Pt presents to ER with c/o right mid back pain that has been ongoing for last 2 weeks.  Pt states she was seen here recently for same and dx with back strain.  Pt states pain becomes worse sometimes when taking a deep breath, but is constant otherwise.  Pt denies any urinary issues, vomiting or diarrhea.  Pt is otherwise A&O x4 and in NAD at this time.

## 2022-12-02 DIAGNOSIS — G8929 Other chronic pain: Secondary | ICD-10-CM | POA: Diagnosis not present

## 2022-12-02 DIAGNOSIS — M549 Dorsalgia, unspecified: Secondary | ICD-10-CM | POA: Diagnosis not present

## 2022-12-02 DIAGNOSIS — Z885 Allergy status to narcotic agent status: Secondary | ICD-10-CM | POA: Diagnosis not present

## 2022-12-02 DIAGNOSIS — Z8739 Personal history of other diseases of the musculoskeletal system and connective tissue: Secondary | ICD-10-CM | POA: Diagnosis not present

## 2022-12-02 DIAGNOSIS — F17210 Nicotine dependence, cigarettes, uncomplicated: Secondary | ICD-10-CM | POA: Diagnosis not present

## 2022-12-02 DIAGNOSIS — J449 Chronic obstructive pulmonary disease, unspecified: Secondary | ICD-10-CM | POA: Diagnosis not present

## 2022-12-02 DIAGNOSIS — X58XXXA Exposure to other specified factors, initial encounter: Secondary | ICD-10-CM | POA: Diagnosis not present

## 2022-12-02 DIAGNOSIS — S39012A Strain of muscle, fascia and tendon of lower back, initial encounter: Secondary | ICD-10-CM | POA: Diagnosis not present

## 2022-12-02 DIAGNOSIS — Z882 Allergy status to sulfonamides status: Secondary | ICD-10-CM | POA: Diagnosis not present

## 2022-12-02 NOTE — ED Notes (Signed)
No answer when called several times from lobby 

## 2022-12-18 ENCOUNTER — Encounter: Payer: Self-pay | Admitting: Family Medicine

## 2022-12-18 ENCOUNTER — Ambulatory Visit (INDEPENDENT_AMBULATORY_CARE_PROVIDER_SITE_OTHER): Payer: BC Managed Care – PPO | Admitting: Family Medicine

## 2022-12-18 VITALS — BP 106/75 | HR 69 | Ht 65.0 in | Wt 163.4 lb

## 2022-12-18 DIAGNOSIS — F17200 Nicotine dependence, unspecified, uncomplicated: Secondary | ICD-10-CM | POA: Diagnosis not present

## 2022-12-18 DIAGNOSIS — Z5320 Procedure and treatment not carried out because of patient's decision for unspecified reasons: Secondary | ICD-10-CM | POA: Insufficient documentation

## 2022-12-18 DIAGNOSIS — Z0000 Encounter for general adult medical examination without abnormal findings: Secondary | ICD-10-CM | POA: Insufficient documentation

## 2022-12-18 DIAGNOSIS — Z1159 Encounter for screening for other viral diseases: Secondary | ICD-10-CM | POA: Insufficient documentation

## 2022-12-18 DIAGNOSIS — F3163 Bipolar disorder, current episode mixed, severe, without psychotic features: Secondary | ICD-10-CM | POA: Diagnosis not present

## 2022-12-18 DIAGNOSIS — E236 Other disorders of pituitary gland: Secondary | ICD-10-CM | POA: Insufficient documentation

## 2022-12-18 MED ORDER — TRAZODONE HCL 100 MG PO TABS
ORAL_TABLET | ORAL | 3 refills | Status: AC
Start: 2022-12-18 — End: ?

## 2022-12-18 NOTE — Assessment & Plan Note (Signed)
Pt notes additional concerns; however, is here to obtain CPE for insurance costs and does not wish to incur additional fees Things to do to keep yourself healthy  - Exercise at least 30-45 minutes a day, 3-4 days a week.  - Eat a low-fat diet with lots of fruits and vegetables, up to 7-9 servings per day.  - Seatbelts can save your life. Wear them always.  - Smoke detectors on every level of your home, check batteries every year.  - Eye Doctor - have an eye exam every 1-2 years  - Safe sex - if you may be exposed to STDs, use a condom.  - Alcohol -  If you drink, do it moderately, less than 2 drinks per day.  - Health Care Power of Attorney. Choose someone to speak for you if you are not able.  - Depression is common in our stressful world.If you're feeling down or losing interest in things you normally enjoy, please come in for a visit.  - Violence - If anyone is threatening or hurting you, please call immediately. Encouraged to reach out for additional assistance if/when needed

## 2022-12-18 NOTE — Assessment & Plan Note (Addendum)
Chronic, with current active exacerbation Pt reports sleep concern but is "managing" Does not wish to incur additional costs to discuss Reports plan to call insurance and get information on therapies to resume treatment Will use trazodone at this time to assist at previous dose of 100 mg; OK to increase up to 200 mg at bedtime prn to assist given no additional medications at this time  Denies concerns for SI/SH or HI/HA

## 2022-12-18 NOTE — Assessment & Plan Note (Signed)
Plans to call insurance for cost concerns

## 2022-12-18 NOTE — Progress Notes (Signed)
Complete physical exam  Patient: Ann Morales   DOB: [birth year]   47 y.o. Female  MRN: [id number] Visit Date: 12/18/2022  Today's healthcare provider: Jacky Kindle, FNP  Re-introduced to nurse practitioner role and practice setting.  All questions answered.  Discussed provider/patient relationship and expectations.  Chief Complaint  Patient presents with   Annual Exam    Diet- General Exercise - Walking while at work Feeling - Well Sleeping - Poorly Concerns -    Subjective    Ann Morales is a 47 y.o. female who presents today for a complete physical exam.   HPI HPI     Annual Exam    Additional comments: Diet- General Exercise - Walking while at work Feeling - Well Sleeping - Poorly Concerns -       Last edited by Acey Lav, CMA on 12/18/2022  3:48 PM.       Past Medical History:  Diagnosis Date   ADHD (attention deficit hyperactivity disorder)    Anxiety    Arthritis    Asthma    well controlled   Bipolar disorder (HCC)    COVID-19 03/29/2020   Depression    GERD (gastroesophageal reflux disease)    Headache    migraines   History of kidney stones    currently as of 05-30-19   Sleep apnea    cpap   Wears dentures    partial upper   Past Surgical History:  Procedure Laterality Date   BACK SURGERY  03/2017   cervical fusion   CHOLECYSTECTOMY     ETHMOIDECTOMY Right 06/12/2020   Procedure: TOTAL ETHMOIDECTOMY;  Surgeon: Geanie Logan, MD;  Location: [facility];  Service: ENT;  Laterality: Right;   FRONTAL SINUS EXPLORATION Right 06/12/2020   Procedure: FRONTAL SINUS EXPLORATION;  Surgeon: Geanie Logan, MD;  Location: [facility];  Service: ENT;  Laterality: Right;   GALLBLADDER SURGERY     HYSTEROSCOPY WITH NOVASURE N/A 06/02/2019   Procedure: DILATATION & HYSTEROSCOPY WITH NOVASURE ABLATION;  Surgeon: Vena Austria, MD;  Location: [facility];  Service: Gynecology;  Laterality: N/A;   IMAGE GUIDED SINUS  SURGERY N/A 06/12/2020   Procedure: IMAGE GUIDED SINUS SURGERY;  Surgeon: Geanie Logan, MD;  Location: [facility];  Service: ENT;  Laterality: N/A;  PLACED DISK ON OR CHARGE NURSE DESK 3-25 KP   LAPAROSCOPY N/A 12/21/2018   Procedure: LAPAROSCOPY DIAGNOSTIC;  Surgeon: Conard Novak, MD;  Location: [facility];  Service: Gynecology;  Laterality: N/A;   MAXILLARY ANTROSTOMY Right 06/12/2020   Procedure: MAXILLARY ANTROSTOMY;  Surgeon: Geanie Logan, MD;  Location: [facility];  Service: ENT;  Laterality: Right;  Covid (+) 03/29/20 sleep apnea   PLANTAR FASCIA RELEASE Left 06/12/2017   Procedure: ENDOSCOPIC PLANTAR FASCIOTOMY-RELEASE;  Surgeon: Gwyneth Revels, DPM;  Location: [facility];  Service: Podiatry;  Laterality: Left;   REPAIR EXTENSOR TENDON Left 06/12/2017   Procedure: REPAIR FLEXOR TENDON;  Surgeon: Gwyneth Revels, DPM;  Location: [facility];  Service: Podiatry;  Laterality: Left;   TARSAL TUNNEL RELEASE Left 06/12/2017   Procedure: TARSAL TUNNEL RELEASE-BAXTER RELEASE;  Surgeon: Gwyneth Revels, DPM;  Location: [facility];  Service: Podiatry;  Laterality: Left;   TUBAL LIGATION     Social History   Socioeconomic History   Marital status: Single    Spouse name: Not on file   Number of children: Not on file   Years of education: Not on file   Highest education level: Not on  file  Occupational History   Not on file  Tobacco Use   Smoking status: Every Day    Current packs/day: 2.00    Average packs/day: 2.0 packs/day for 28.2 years (56.5 ttl pk-yrs)    Types: Cigarettes    Start date: 09/25/1994   Smokeless tobacco: Never  Vaping Use   Vaping status: Never Used  Substance and Sexual Activity   Alcohol use: No    Alcohol/week: 0.0 standard drinks of alcohol   Drug use: No   Sexual activity: Yes    Birth control/protection: Surgical  Other Topics Concern   Not on file  Social History Narrative   Not on file   Social Determinants of Health   Financial Resource  Strain: Not on file  Food Insecurity: Not on file  Transportation Needs: Not on file  Physical Activity: Not on file  Stress: Not on file  Social Connections: Not on file  Intimate Partner Violence: Not on file   Family Status  Relation Name Status   Father  Alive   Mother  Alive   Brother  Alive   MGM  Alive   MGF  Deceased   PGM  Deceased   PGF  Deceased  No partnership data on file   Family History  Problem Relation Age of Onset   Hypercholesterolemia Father    Bipolar disorder Mother    Hypercholesterolemia Maternal Grandmother    Hypertension Maternal Grandmother    Anxiety disorder Maternal Grandmother    Depression Maternal Grandmother    Ovarian cancer Maternal Grandmother    Colon cancer Maternal Grandfather 60   Ovarian cancer Paternal Grandmother    Allergies  Allergen Reactions   Codeine Hives, Shortness Of Breath and Other (See Comments)   Azithromycin Diarrhea   Calamine-Zinc Oxide Itching   Doxycycline Nausea And Vomiting   Other Other (See Comments)    Stomach pain  Severe abdominal pain.   Sulfa Antibiotics Other (See Comments)    Severe abdominal pain.   Zinc Other (See Comments)    Burning sensation    Patient Care Team: Jacky Kindle, FNP as PCP - General (Family Medicine)   Medications: Outpatient Medications Prior to Visit  Medication Sig   [DISCONTINUED] cyclobenzaprine (FLEXERIL) 5 MG tablet Take 1-2 tablets (5-10 mg total) by mouth 3 (three) times daily as needed. (Patient not taking: Reported on 12/18/2022)   [DISCONTINUED] INVEGA SUSTENNA 234 MG/1.5ML SUSY injection Inject into the muscle. (Patient not taking: Reported on 12/18/2022)   [DISCONTINUED] lithium 300 MG tablet Take 300 mg by mouth 2 (two) times daily. (Patient not taking: Reported on 12/18/2022)   [DISCONTINUED] mirabegron ER (MYRBETRIQ) 25 MG TB24 tablet Take 1 tablet (25 mg total) by mouth daily.   [DISCONTINUED] oxyCODONE (ROXICODONE) 5 MG immediate release tablet Take  0.5 tablets (2.5 mg total) by mouth every 8 (eight) hours as needed. (Patient not taking: Reported on 12/18/2022)   [DISCONTINUED] SUMAtriptan (IMITREX) 50 MG tablet May repeat in 2 hours if headache persists or recurs. (Patient not taking: Reported on 12/18/2022)   [DISCONTINUED] traZODone (DESYREL) 100 MG tablet Take 100 mg by mouth at bedtime. (Patient not taking: Reported on 12/18/2022)   No facility-administered medications prior to visit.    Objective    BP 106/75 (BP Location: Left Arm, Patient Position: Sitting, Cuff Size: Normal)   Pulse 69   Ht 5\' 5"  (1.651 m)   Wt 163 lb 6.4 oz (74.1 kg)   SpO2 100%   BMI 27.19 kg/m  Physical Exam Vitals and nursing note reviewed.  Constitutional:      General: She is awake. She is not in acute distress.    Appearance: Normal appearance. She is well-developed, well-groomed and overweight. She is not ill-appearing, toxic-appearing or diaphoretic.  HENT:     Head: Normocephalic and atraumatic.     Jaw: There is normal jaw occlusion. No trismus, tenderness, swelling or pain on movement.     Right Ear: Hearing, tympanic membrane, ear canal and external ear normal. There is no impacted cerumen.     Left Ear: Hearing, tympanic membrane, ear canal and external ear normal. There is no impacted cerumen.     Nose: Nose normal. No congestion or rhinorrhea.     Right Turbinates: Not enlarged, swollen or pale.     Left Turbinates: Not enlarged, swollen or pale.     Right Sinus: No maxillary sinus tenderness or frontal sinus tenderness.     Left Sinus: No maxillary sinus tenderness or frontal sinus tenderness.     Mouth/Throat:     Lips: Pink.     Mouth: Mucous membranes are moist. No injury.     Tongue: No lesions.     Pharynx: Oropharynx is clear. Uvula midline. No pharyngeal swelling, oropharyngeal exudate, posterior oropharyngeal erythema or uvula swelling.     Tonsils: No tonsillar exudate or tonsillar abscesses.  Eyes:     General: Lids are  normal. Lids are everted, no foreign bodies appreciated. Vision grossly intact. Gaze aligned appropriately. No allergic shiner or visual field deficit.       Right eye: No discharge.        Left eye: No discharge.     Extraocular Movements: Extraocular movements intact.     Conjunctiva/sclera: Conjunctivae normal.     Right eye: Right conjunctiva is not injected. No exudate.    Left eye: Left conjunctiva is not injected. No exudate.    Pupils: Pupils are equal, round, and reactive to light.  Neck:     Thyroid: No thyroid mass, thyromegaly or thyroid tenderness.     Vascular: No carotid bruit.     Trachea: Trachea normal.  Cardiovascular:     Rate and Rhythm: Normal rate and regular rhythm.     Pulses: Normal pulses.          Carotid pulses are 2+ on the right side and 2+ on the left side.      Radial pulses are 2+ on the right side and 2+ on the left side.       Dorsalis pedis pulses are 2+ on the right side and 2+ on the left side.       Posterior tibial pulses are 2+ on the right side and 2+ on the left side.     Heart sounds: Normal heart sounds, S1 normal and S2 normal. No murmur heard.    No friction rub. No gallop.  Pulmonary:     Effort: Pulmonary effort is normal. No respiratory distress.     Breath sounds: Normal breath sounds and air entry. No stridor. No wheezing, rhonchi or rales.  Chest:     Chest wall: No tenderness.  Abdominal:     General: Abdomen is flat. Bowel sounds are normal. There is no distension.     Palpations: Abdomen is soft. There is no mass.     Tenderness: There is no abdominal tenderness. There is no right CVA tenderness, left CVA tenderness, guarding or rebound.     Hernia: No hernia is  present.  Genitourinary:    Comments: Exam deferred; denies complaints Musculoskeletal:        General: No swelling, tenderness, deformity or signs of injury. Normal range of motion.     Cervical back: Full passive range of motion without pain, normal range of motion  and neck supple. No edema, rigidity or tenderness. No muscular tenderness.     Right lower leg: No edema.     Left lower leg: No edema.  Lymphadenopathy:     Cervical: No cervical adenopathy.     Right cervical: No superficial, deep or posterior cervical adenopathy.    Left cervical: No superficial, deep or posterior cervical adenopathy.  Skin:    General: Skin is warm and dry.     Capillary Refill: Capillary refill takes less than 2 seconds.     Coloration: Skin is not jaundiced or pale.     Findings: No bruising, erythema, lesion or rash.  Neurological:     General: No focal deficit present.     Mental Status: She is alert and oriented to person, place, and time. Mental status is at baseline.     GCS: GCS eye subscore is 4. GCS verbal subscore is 5. GCS motor subscore is 6.     Sensory: Sensation is intact. No sensory deficit.     Motor: Motor function is intact. No weakness.     Coordination: Coordination is intact. Coordination normal.     Gait: Gait is intact. Gait normal.  Psychiatric:        Attention and Perception: Attention and perception normal.        Mood and Affect: Mood and affect normal.        Speech: Speech normal.        Behavior: Behavior normal. Behavior is cooperative.        Thought Content: Thought content normal.        Cognition and Memory: Cognition and memory normal.        Judgment: Judgment normal.     Last depression screening scores    12/18/2022    3:49 PM 02/26/2021    1:54 PM 12/06/2019    9:30 AM  PHQ 2/9 Scores  PHQ - 2 Score 6 1 0  PHQ- 9 Score 21 4 4    Last fall risk screening    12/18/2022    3:48 PM  Fall Risk   Falls in the past year? 0  Injury with Fall? 0  Risk for fall due to : No Fall Risks  Follow up Falls evaluation completed   Last Audit-C alcohol use screening    02/26/2021    1:53 PM  Alcohol Use Disorder Test (AUDIT)  1. How often do you have a drink containing alcohol? 0  2. How many drinks containing alcohol  do you have on a typical day when you are drinking? 0  3. How often do you have six or more drinks on one occasion? 0  AUDIT-C Score 0   A score of 3 or more in women, and 4 or more in men indicates increased risk for alcohol abuse, EXCEPT if all of the points are from question 1   No results found for any visits on 12/18/22.  Assessment & Plan    Routine Health Maintenance and Physical Exam  Exercise Activities and Dietary recommendations  Goals   None     Immunization History  Administered Date(s) Administered   Moderna Sars-Covid-2 Vaccination 09/06/2019   Tdap 06/12/2010  Health Maintenance  Topic Date Due   Hepatitis C Screening  Never done   Colonoscopy  Never done   DTaP/Tdap/Td (2 - Td or Tdap) 06/11/2020   COVID-19 Vaccine (2 - 2023-24 season) 11/09/2022   INFLUENZA VACCINE  06/08/2023 (Originally 10/09/2022)   Cervical Cancer Screening (HPV/Pap Cotest)  11/25/2023   HIV Screening  Completed   HPV VACCINES  Aged Out    Discussed health benefits of physical activity, and encouraged her to engage in regular exercise appropriate for her age and condition.  Problem List Items Addressed This Visit       Other   Annual physical exam - Primary    Pt notes additional concerns; however, is here to obtain CPE for insurance costs and does not wish to incur additional fees Things to do to keep yourself healthy  - Exercise at least 30-45 minutes a day, 3-4 days a week.  - Eat a low-fat diet with lots of fruits and vegetables, up to 7-9 servings per day.  - Seatbelts can save your life. Wear them always.  - Smoke detectors on every level of your home, check batteries every year.  - Eye Doctor - have an eye exam every 1-2 years  - Safe sex - if you may be exposed to STDs, use a condom.  - Alcohol -  If you drink, do it moderately, less than 2 drinks per day.  - Health Care Power of Attorney. Choose someone to speak for you if you are not able.  - Depression is common in  our stressful world.If you're feeling down or losing interest in things you normally enjoy, please come in for a visit.  - Violence - If anyone is threatening or hurting you, please call immediately. Encouraged to reach out for additional assistance if/when needed      Relevant Orders   CBC with Differential/Platelet   Comprehensive Metabolic Panel (CMET)   TSH   Lipid panel   Bipolar disorder (HCC)    Chronic, with current active exacerbation Pt reports sleep concern but is "managing" Does not wish to incur additional costs to discuss Reports plan to call insurance and get information on therapies to resume treatment Will use trazodone at this time to assist at previous dose of 100 mg; OK to increase up to 200 mg at bedtime prn to assist given no additional medications at this time  Denies concerns for SI/SH or HI/HA      Relevant Medications   traZODone (DESYREL) 100 MG tablet   Colon cancer screening declined    Plans to call insurance for cost concerns      Encounter for hepatitis C screening test for low risk patient    Low risk screen Treatable, and curable. If left untreated Hep C can lead to cirrhosis and liver failure. Encourage routine testing; recommend repeat testing if risk factors change.       Relevant Orders   Hepatitis C Antibody   Mammogram declined    Plans to call insurance for cost concerns      Tobacco dependence    Chronic, stable Continues to use tobacco products; declines cessation efforts at this time       Return if symptoms worsen or fail to improve.    Leilani Merl, FNP, have reviewed all documentation for this visit. The documentation on 12/18/22 for the exam, diagnosis, procedures, and orders are all accurate and complete.  Jacky Kindle, FNP  [facility] [phone]  phone) [phone] (fax)  [facility]

## 2022-12-18 NOTE — Assessment & Plan Note (Signed)
Chronic, stable Continues to use tobacco products; declines cessation efforts at this time

## 2022-12-18 NOTE — Assessment & Plan Note (Signed)
Low risk screen Treatable, and curable. If left untreated Hep C can lead to cirrhosis and liver failure. Encourage routine testing; recommend repeat testing if risk factors change.  

## 2022-12-19 LAB — CBC WITH DIFFERENTIAL/PLATELET
Basophils Absolute: 0.1 10*3/uL (ref 0.0–0.2)
Basos: 1 %
EOS (ABSOLUTE): 0.1 10*3/uL (ref 0.0–0.4)
Eos: 2 %
Hematocrit: 40.4 % (ref 34.0–46.6)
Hemoglobin: 13 g/dL (ref 11.1–15.9)
Immature Grans (Abs): 0 10*3/uL (ref 0.0–0.1)
Immature Granulocytes: 0 %
Lymphocytes Absolute: 3.5 10*3/uL — ABNORMAL HIGH (ref 0.7–3.1)
Lymphs: 39 %
MCH: 28.6 pg (ref 26.6–33.0)
MCHC: 32.2 g/dL (ref 31.5–35.7)
MCV: 89 fL (ref 79–97)
Monocytes Absolute: 0.6 10*3/uL (ref 0.1–0.9)
Monocytes: 7 %
Neutrophils Absolute: 4.6 10*3/uL (ref 1.4–7.0)
Neutrophils: 51 %
Platelets: 200 10*3/uL (ref 150–450)
RBC: 4.55 x10E6/uL (ref 3.77–5.28)
RDW: 12.3 % (ref 11.7–15.4)
WBC: 9 10*3/uL (ref 3.4–10.8)

## 2022-12-19 LAB — COMPREHENSIVE METABOLIC PANEL
ALT: 15 [IU]/L (ref 0–32)
AST: 20 [IU]/L (ref 0–40)
Albumin: 3.9 g/dL (ref 3.9–4.9)
Alkaline Phosphatase: 59 [IU]/L (ref 44–121)
BUN/Creatinine Ratio: 13 (ref 9–23)
BUN: 11 mg/dL (ref 6–24)
Bilirubin Total: 0.2 mg/dL (ref 0.0–1.2)
CO2: 25 mmol/L (ref 20–29)
Calcium: 9.1 mg/dL (ref 8.7–10.2)
Chloride: 102 mmol/L (ref 96–106)
Creatinine, Ser: 0.82 mg/dL (ref 0.57–1.00)
Globulin, Total: 2.2 g/dL (ref 1.5–4.5)
Glucose: 89 mg/dL (ref 70–99)
Potassium: 4.2 mmol/L (ref 3.5–5.2)
Sodium: 141 mmol/L (ref 134–144)
Total Protein: 6.1 g/dL (ref 6.0–8.5)
eGFR: 88 mL/min/{1.73_m2} (ref >59)

## 2022-12-19 LAB — TSH: TSH: 1.24 u[IU]/mL (ref 0.450–4.500)

## 2022-12-19 LAB — HEPATITIS C ANTIBODY: Hep C Virus Ab: NONREACTIVE

## 2022-12-19 LAB — LIPID PANEL
Chol/HDL Ratio: 2.7 {ratio} (ref 0.0–4.4)
Cholesterol, Total: 182 mg/dL (ref 100–199)
HDL: 68 mg/dL (ref >39)
LDL Chol Calc (NIH): 102 mg/dL — ABNORMAL HIGH (ref 0–99)
Triglycerides: 62 mg/dL (ref 0–149)
VLDL Cholesterol Cal: 12 mg/dL (ref 5–40)

## 2022-12-22 ENCOUNTER — Ambulatory Visit: Payer: Self-pay | Admitting: *Deleted

## 2022-12-22 NOTE — Telephone Encounter (Signed)
Message from Alessandra Bevels sent at 12/22/2022  9:53 AM EDT  Summary: Dizzy advise   Pt is calling to report that she has been extremely thirsty and dizzy. Received lab results that the DK was elevated would like to know what Robynn Pane plans to do about it. Pt reports that she goes to lunch about 12:00p and can talk around that time          Call History  Contact Date/Time Type Contact Phone/Fax User  12/22/2022 09:51 AM EDT Phone (Incoming) [place], [place] (Self) [phone] Judie Petit) Valora Piccolo

## 2022-12-22 NOTE — Telephone Encounter (Signed)
Attempted to return her call.   Left a voicemail to call us back to discuss symptoms with a nurse.   Let her know to call us on her lunch break or whenever she is available to talk.   Note indicated she could talk during her lunch

## 2022-12-22 NOTE — Telephone Encounter (Signed)
Chief Complaint: Dizzy Symptoms: dizzy feeling lightheaded, extreme thirst, chest pain, headache Frequency: Comes and goes Pertinent Negatives: Patient denies current chest pain, fever, faint, nausea, diarrhea Disposition: [] ED /[] Urgent Care (no appt availability in office) / [x] Appointment(In office/virtual)/ []  [facility]/ [] Home Care/ [] Refused Recommended Disposition /[] [facility]/ []  Follow-up with PCP Additional Notes: Patient states she has been experiencing dizziness and extreme thirst for about 1 month. She stated she thought it had something to do with her lab results. Pt given lab results per notes of Robynn Pane on 12/19/22. Pt  verbalized understanding.  Patient stated that sometimes she has a headache and chest pain as well. Care advice was given and patient has been scheduled to see PCP tomorrow for evaluation of symptoms. Advised patient to callback if symptoms get worse.  Reason for Disposition  [1] MODERATE dizziness (e.g., interferes with normal activities) AND [2] has NOT been evaluated by doctor (or NP/PA) for this  (Exception: Dizziness caused by heat exposure, sudden standing, or poor fluid intake.)  Answer Assessment - Initial Assessment Questions 1. DESCRIPTION: "Describe your dizziness."     I feel lightheaded 2. LIGHTHEADED: "Do you feel lightheaded?" (e.g., somewhat faint, woozy, weak upon standing)     Yes, faint and woozy 3. VERTIGO: "Do you feel like either you or the room is spinning or tilting?" (i.e. vertigo)     Yes  4. SEVERITY: "How bad is it?"  "Do you feel like you are going to faint?" "Can you stand and walk?"   - MILD: Feels slightly dizzy, but walking normally.   - MODERATE: Feels unsteady when walking, but not falling; interferes with normal activities (e.g., school, work).   - SEVERE: Unable to walk without falling, or requires assistance to walk without falling; feels like passing out now.      Moderate 5. ONSET:  "When did the  dizziness begin?"     About 1 month 6. AGGRAVATING FACTORS: "Does anything make it worse?" (e.g., standing, change in head position)     No   8. CAUSE: "What do you think is causing the dizziness?"     I'm not sure  9. RECURRENT SYMPTOM: "Have you had dizziness before?" If Yes, ask: "When was the last time?" "What happened that time?"     Yes, I went to the hospital and I was treated for low blood pressure August 2023 10. OTHER SYMPTOMS: "Do you have any other symptoms?" (e.g., fever, chest pain, vomiting, diarrhea, bleeding)       Extremely thirsty, headache, chest pain  Protocols used: Dizziness - Lightheadedness-A-AH

## 2022-12-23 ENCOUNTER — Ambulatory Visit (INDEPENDENT_AMBULATORY_CARE_PROVIDER_SITE_OTHER): Payer: BC Managed Care – PPO | Admitting: Family Medicine

## 2022-12-23 ENCOUNTER — Other Ambulatory Visit: Payer: Self-pay | Admitting: Family Medicine

## 2022-12-23 ENCOUNTER — Encounter: Payer: Self-pay | Admitting: Family Medicine

## 2022-12-23 VITALS — BP 73/50 | HR 56 | Temp 98.2°F | Ht 65.0 in | Wt 167.4 lb

## 2022-12-23 DIAGNOSIS — E236 Other disorders of pituitary gland: Secondary | ICD-10-CM | POA: Diagnosis not present

## 2022-12-23 DIAGNOSIS — G903 Multi-system degeneration of the autonomic nervous system: Secondary | ICD-10-CM

## 2022-12-23 DIAGNOSIS — M5416 Radiculopathy, lumbar region: Secondary | ICD-10-CM | POA: Diagnosis not present

## 2022-12-23 MED ORDER — CYCLOBENZAPRINE HCL 10 MG PO TABS
10.0000 mg | ORAL_TABLET | Freq: Three times a day (TID) | ORAL | 0 refills | Status: AC | PRN
Start: 2022-12-23 — End: ?

## 2022-12-23 MED ORDER — MIDODRINE HCL 5 MG PO TABS
5.0000 mg | ORAL_TABLET | Freq: Three times a day (TID) | ORAL | 0 refills | Status: DC
Start: 2022-12-23 — End: 2023-01-01

## 2022-12-23 MED ORDER — PREDNISONE 10 MG PO TABS
ORAL_TABLET | ORAL | 0 refills | Status: DC
Start: 2022-12-23 — End: 2023-07-17

## 2022-12-23 MED ORDER — OXYCODONE-ACETAMINOPHEN 10-325 MG PO TABS
1.0000 | ORAL_TABLET | Freq: Three times a day (TID) | ORAL | 0 refills | Status: AC | PRN
Start: 2022-12-23 — End: 2022-12-28

## 2022-12-23 NOTE — Progress Notes (Signed)
Established patient visit   Patient: Ann Morales   DOB: [birth year]   47 y.o. Female  MRN: [id number] Visit Date: 12/23/2022  Today's healthcare provider: Jacky Kindle, FNP  Introduced to nurse practitioner role and practice setting.  All questions answered.  Discussed provider/patient relationship and expectations.  Subjective    HPI HPI     Medical Management of Chronic Issues    Additional comments: Present today for extreme thirst, chest pains, and dizziness. Has been present for about 1 1/2 months. Had a previous back injury      Last edited by Rolly Salter, CMA on 12/23/2022  1:21 PM.      Medications: Outpatient Medications Prior to Visit  Medication Sig   traZODone (DESYREL) 100 MG tablet Take 1-2 tablets 45 minutes prior to sleep for insomnia.   No facility-administered medications prior to visit.     Objective    BP (!) 73/50 (BP Location: Right Arm, Patient Position: Sitting, Cuff Size: Large)   Pulse (!) 56   Temp 98.2 F (36.8 C) (Oral)   Ht 5\' 5"  (1.651 m)   Wt 167 lb 6.4 oz (75.9 kg)   SpO2 100%   BMI 27.86 kg/m   Physical Exam Vitals and nursing note reviewed.  Constitutional:      General: She is not in acute distress.    Appearance: Normal appearance. She is overweight. She is not ill-appearing, toxic-appearing or diaphoretic.  HENT:     Head: Normocephalic and atraumatic.  Cardiovascular:     Rate and Rhythm: Normal rate and regular rhythm.     Pulses: Normal pulses.     Heart sounds: Normal heart sounds. No murmur heard.    No friction rub. No gallop.  Pulmonary:     Effort: Pulmonary effort is normal. No respiratory distress.     Breath sounds: Normal breath sounds. No stridor. No wheezing, rhonchi or rales.  Chest:     Chest wall: No tenderness.  Musculoskeletal:        General: No swelling, tenderness, deformity or signs of injury. Normal range of motion.     Right lower leg: No edema.     Left lower leg: No edema.   Skin:    General: Skin is warm and dry.     Capillary Refill: Capillary refill takes less than 2 seconds.     Coloration: Skin is not jaundiced or pale.     Findings: No bruising, erythema, lesion or rash.  Neurological:     General: No focal deficit present.     Mental Status: She is alert and oriented to person, place, and time. Mental status is at baseline.     Cranial Nerves: No cranial nerve deficit.     Sensory: No sensory deficit.     Motor: No weakness.     Coordination: Coordination normal.     Comments: No weakness noted, able to ambulate without assistance.  Psychiatric:        Mood and Affect: Mood normal.        Behavior: Behavior normal.        Thought Content: Thought content normal.        Judgment: Judgment normal.     Comments: Able to speak in clear, coherent, complete sentences.      No results found for any visits on 12/23/22.  Assessment & Plan     Problem List Items Addressed This Visit       Cardiovascular and Mediastinum  Neurogenic orthostatic hypotension (HCC) - Primary    Acute on chronic, last seen and admitted for hospitalization ~15 months ago Was started on midodrine; pt has not kept up with Rx given insurance/cost variance Will restart today at 5 mg TID to assist with low blood pressure Patient is awake, alert, not in distress. Well appearing; no dizziness on witnessed gait or weakness. She is able to speak in full sentences and is coherent.  Close f/u recommended      Relevant Medications   midodrine (PROAMATINE) 5 MG tablet   predniSONE (DELTASONE) 10 MG tablet   cyclobenzaprine (FLEXERIL) 10 MG tablet   oxyCODONE-acetaminophen (PERCOCET) 10-325 MG tablet     Nervous and Auditory   Lumbar radiculopathy, acute    Acute on chronic, reports injury ~6 weeks ago. Recommend 2 week course of steroids, with OTC NSAIDs after completion; will also provide PRN muscle relaxant to assist Pt request for percocet to assist only as needed; will  write for 5 day course with strict instruction not to take if she has any neurological complaints as reduction of her BP or MAP could result in poor perfusion to vital organs and even the possibility of death Pt is aware of risks of opioid medication use to include increased sedation, respiratory suppression, falls, extrapyramidal movements,  dependence and cardiovascular events.  Pt would like to continue treatment as benefit determined to outweigh risk.         Relevant Medications   midodrine (PROAMATINE) 5 MG tablet   predniSONE (DELTASONE) 10 MG tablet   cyclobenzaprine (FLEXERIL) 10 MG tablet   oxyCODONE-acetaminophen (PERCOCET) 10-325 MG tablet     Other   Pituitary mass (HCC)    Previously stable; needs neurology to assist given ongoing symptoms of weight loss, and variable BP      Relevant Orders   Ambulatory referral to Neurology   Return in about 1 week (around 12/30/2022), or if symptoms worsen or fail to improve.     Leilani Merl, FNP, have reviewed all documentation for this visit. The documentation on 12/23/22 for the exam, diagnosis, procedures, and orders are all accurate and complete.  Jacky Kindle, FNP  [facility] [phone] (phone) [phone] (fax)  [facility]

## 2022-12-23 NOTE — Assessment & Plan Note (Signed)
Previously stable; needs neurology to assist given ongoing symptoms of weight loss, and variable BP

## 2022-12-23 NOTE — Assessment & Plan Note (Addendum)
Acute on chronic, last seen and admitted for hospitalization ~15 months ago Was started on midodrine; pt has not kept up with Rx given insurance/cost variance Will restart today at 5 mg TID to assist with low blood pressure Patient is awake, alert, not in distress. Well appearing; no dizziness on witnessed gait or weakness. She is able to speak in full sentences and is coherent.  Close f/u recommended

## 2022-12-23 NOTE — Patient Instructions (Signed)
If dizziness persists or you are unable to eat, drink or feel unsteady; please seek emergent care by calling [phone].

## 2022-12-23 NOTE — Assessment & Plan Note (Signed)
Acute on chronic, reports injury ~6 weeks ago. Recommend 2 week course of steroids, with OTC NSAIDs after completion; will also provide PRN muscle relaxant to assist Pt request for percocet to assist only as needed; will write for 5 day course with strict instruction not to take if she has any neurological complaints as reduction of her BP or MAP could result in poor perfusion to vital organs and even the possibility of death Pt is aware of risks of opioid medication use to include increased sedation, respiratory suppression, falls, extrapyramidal movements,  dependence and cardiovascular events.  Pt would like to continue treatment as benefit determined to outweigh risk.

## 2022-12-25 ENCOUNTER — Other Ambulatory Visit: Payer: Self-pay | Admitting: Family Medicine

## 2022-12-25 DIAGNOSIS — M5416 Radiculopathy, lumbar region: Secondary | ICD-10-CM

## 2022-12-25 DIAGNOSIS — E236 Other disorders of pituitary gland: Secondary | ICD-10-CM

## 2022-12-25 DIAGNOSIS — G903 Multi-system degeneration of the autonomic nervous system: Secondary | ICD-10-CM

## 2022-12-28 DIAGNOSIS — Z882 Allergy status to sulfonamides status: Secondary | ICD-10-CM | POA: Diagnosis not present

## 2022-12-28 DIAGNOSIS — J449 Chronic obstructive pulmonary disease, unspecified: Secondary | ICD-10-CM | POA: Diagnosis not present

## 2022-12-28 DIAGNOSIS — R531 Weakness: Secondary | ICD-10-CM | POA: Diagnosis not present

## 2022-12-28 DIAGNOSIS — Z1152 Encounter for screening for COVID-19: Secondary | ICD-10-CM | POA: Diagnosis not present

## 2022-12-28 DIAGNOSIS — F17210 Nicotine dependence, cigarettes, uncomplicated: Secondary | ICD-10-CM | POA: Diagnosis not present

## 2022-12-28 DIAGNOSIS — Z20822 Contact with and (suspected) exposure to covid-19: Secondary | ICD-10-CM | POA: Diagnosis not present

## 2022-12-28 DIAGNOSIS — G903 Multi-system degeneration of the autonomic nervous system: Secondary | ICD-10-CM | POA: Diagnosis not present

## 2022-12-28 DIAGNOSIS — Z5941 Food insecurity: Secondary | ICD-10-CM | POA: Diagnosis not present

## 2022-12-28 DIAGNOSIS — R079 Chest pain, unspecified: Secondary | ICD-10-CM | POA: Diagnosis not present

## 2022-12-28 DIAGNOSIS — F419 Anxiety disorder, unspecified: Secondary | ICD-10-CM | POA: Diagnosis not present

## 2022-12-28 DIAGNOSIS — R42 Dizziness and giddiness: Secondary | ICD-10-CM | POA: Diagnosis not present

## 2022-12-28 DIAGNOSIS — K219 Gastro-esophageal reflux disease without esophagitis: Secondary | ICD-10-CM | POA: Diagnosis not present

## 2022-12-28 DIAGNOSIS — I959 Hypotension, unspecified: Secondary | ICD-10-CM | POA: Diagnosis not present

## 2022-12-28 DIAGNOSIS — R001 Bradycardia, unspecified: Secondary | ICD-10-CM | POA: Diagnosis not present

## 2022-12-28 DIAGNOSIS — R0689 Other abnormalities of breathing: Secondary | ICD-10-CM | POA: Diagnosis not present

## 2022-12-28 DIAGNOSIS — Z8679 Personal history of other diseases of the circulatory system: Secondary | ICD-10-CM | POA: Diagnosis not present

## 2022-12-28 DIAGNOSIS — Z881 Allergy status to other antibiotic agents status: Secondary | ICD-10-CM | POA: Diagnosis not present

## 2022-12-28 DIAGNOSIS — F32A Depression, unspecified: Secondary | ICD-10-CM | POA: Diagnosis not present

## 2022-12-28 DIAGNOSIS — Z885 Allergy status to narcotic agent status: Secondary | ICD-10-CM | POA: Diagnosis not present

## 2022-12-29 ENCOUNTER — Telehealth: Payer: Self-pay

## 2022-12-29 ENCOUNTER — Other Ambulatory Visit: Payer: Self-pay | Admitting: Family Medicine

## 2022-12-29 DIAGNOSIS — E236 Other disorders of pituitary gland: Secondary | ICD-10-CM

## 2022-12-29 DIAGNOSIS — G903 Multi-system degeneration of the autonomic nervous system: Secondary | ICD-10-CM

## 2022-12-29 NOTE — Telephone Encounter (Signed)
Copied from CRM [id number]. Topic: Referral - Request for Referral >> Dec 29, 2022  8:38 AM Patsy Lager T wrote: Has patient seen PCP for this complaint? Yes.   Referral for which specialty: Cardiologist Preferred provider/office: unkknown Reason for referral: low blood pressure

## 2022-12-30 ENCOUNTER — Encounter: Payer: Self-pay | Admitting: Family Medicine

## 2023-01-01 ENCOUNTER — Ambulatory Visit: Payer: Self-pay

## 2023-01-01 ENCOUNTER — Encounter: Payer: Self-pay | Admitting: Physician Assistant

## 2023-01-01 ENCOUNTER — Ambulatory Visit (INDEPENDENT_AMBULATORY_CARE_PROVIDER_SITE_OTHER): Payer: BC Managed Care – PPO | Admitting: Physician Assistant

## 2023-01-01 VITALS — BP 94/68 | HR 89 | Temp 97.9°F | Resp 16 | Ht 65.0 in | Wt 160.7 lb

## 2023-01-01 DIAGNOSIS — R519 Headache, unspecified: Secondary | ICD-10-CM | POA: Diagnosis not present

## 2023-01-01 DIAGNOSIS — G903 Multi-system degeneration of the autonomic nervous system: Secondary | ICD-10-CM

## 2023-01-01 DIAGNOSIS — M5416 Radiculopathy, lumbar region: Secondary | ICD-10-CM | POA: Diagnosis not present

## 2023-01-01 DIAGNOSIS — G8929 Other chronic pain: Secondary | ICD-10-CM

## 2023-01-01 MED ORDER — MIDODRINE HCL 5 MG PO TABS: ORAL_TABLET | ORAL | Status: DC

## 2023-01-01 MED ORDER — SUMATRIPTAN SUCCINATE 25 MG PO TABS: 25.0000 mg | ORAL_TABLET | ORAL | 0 refills | Status: DC | PRN

## 2023-01-01 NOTE — Patient Instructions (Addendum)
At this time I recommend increasing your Midodrine to 10 mg twice per day and 5 mg at night  Please make sure you are drinking plenty of fluids and I do not recommend limiting your salt intake while you are still having low Blood pressures

## 2023-01-01 NOTE — Assessment & Plan Note (Signed)
Acute on chronic, ongoing Patient reports that she is having persistent low blood pressures along with dizziness, headaches, intermittent confusion. She denies syncope or falls She also reports persistent headache that I suspect is likely secondary to decreased blood pressure She has been taking midodrine 5 mg p.o. 3 times daily as directed without much improvement to her symptoms BP today in office is 94/68 Reviewed ED visit notes from 12/28/2022-recheck magnesium, CMP, CBC Recommend that she increase his midodrine to 10 mg p.o. twice daily and takes 5 mg at night for now.  Recommend that she stays well-hydrated and does not restrict salt intake until instructed otherwise She reports she has a history of migraines but states that her headaches do not feel similar to that and are not responding to over-the-counter NSAIDs or Tylenol. She reports that she used to take sumatriptan.  Chart review demonstrates that her most recent prescription for this was 2023. Given the vasoconstrictive nature of sumatriptan will provide prescription for 25 mg tablets every 2 hours as needed up to 2 doses per day Recommend that she is follows up with her PCP in 1 week for close monitoring. Reviewed ED and return precautions with strict emergency room parameters were reviewed Recommend that she reaches out to schedule neurology appointment Continue close follow-up

## 2023-01-01 NOTE — Progress Notes (Signed)
Acute Office Visit   Patient: Ann Morales   DOB: [birth year]   47 y.o. Female  MRN: [id number] Visit Date: 01/01/2023  Today's healthcare provider: Oswaldo Conroy Dyshaun Bonzo, PA-C  Introduced myself to the patient as a Secondary school teacher and provided education on APPs in clinical practice.    Chief Complaint  Patient presents with   Hypotension   Subjective    HPI   Concern for Hypotension  She reports she has been having a headache for the past 5 days and she also endorses chest pain for the past month She reports she has been checking her BP daily since Sunday after she was seen in the ED for hypotension  Lowest: something/46 (thinks it may have been in 70s/46)   Associated symptoms: dizziness, headaches, confusion  She denies syncope or falls  Interventions: she has been taking Midodrine as directed- took two doses so far today, will take 3rd tonight  She has been taking Advil, aleve, tylenol, ibuprofen for headaches but nothing has helped   She is drinking 2-3 40 oz coolaide waters per day  She reports decreased appetite over the past few days   She does not report any improvement to her symptoms with Midodrine administration    Medications: Outpatient Medications Prior to Visit  Medication Sig   cyclobenzaprine (FLEXERIL) 10 MG tablet Take 1 tablet (10 mg total) by mouth 3 (three) times daily as needed for muscle spasms.   midodrine (PROAMATINE) 5 MG tablet Take 1 tablet (5 mg total) by mouth 3 (three) times daily with meals.   predniSONE (DELTASONE) 10 MG tablet Day 1 & 2 take 6 tablets Day 3 &4 take 5 tablets Day 5 &6 take 4 tablets Day 7 & 8 take 3 tablets Day 9 & 10 take 2 tablets Day 11 & 12 take 1 tablet Day 13 & 14 take 1/2 tablet   traZODone (DESYREL) 100 MG tablet Take 1-2 tablets 45 minutes prior to sleep for insomnia.   No facility-administered medications prior to visit.    Review of Systems  Eyes:  Negative for visual disturbance.  Cardiovascular:  Positive for  chest pain and leg swelling. Negative for palpitations.  Neurological:  Positive for dizziness and headaches. Negative for syncope.  Psychiatric/Behavioral:  Positive for confusion.         Objective    BP 94/68   Pulse 89   Temp 97.9 F (36.6 C) (Oral)   Resp 16   Ht 5\' 5"  (1.651 m)   Wt 160 lb 11.2 oz (72.9 kg)   SpO2 97%   BMI 26.74 kg/m     Physical Exam Vitals reviewed.  Constitutional:      General: She is awake. She is not in acute distress.    Appearance: Normal appearance. She is well-developed and well-groomed. She is not ill-appearing, toxic-appearing or diaphoretic.  HENT:     Head: Normocephalic and atraumatic.  Cardiovascular:     Rate and Rhythm: Regular rhythm. Bradycardia present.     Pulses:          Radial pulses are 1+ on the right side and 1+ on the left side.     Heart sounds: Normal heart sounds. No murmur heard.    No friction rub. No gallop.  Pulmonary:     Effort: Pulmonary effort is normal.     Breath sounds: Normal breath sounds. No decreased air movement. No wheezing, rhonchi or rales.  Musculoskeletal:  Right lower leg: No edema.     Left lower leg: No edema.  Skin:    General: Skin is warm and dry.     Capillary Refill: Capillary refill takes 2 to 3 seconds.  Neurological:     General: No focal deficit present.     Mental Status: She is alert and oriented to person, place, and time.     GCS: GCS eye subscore is 4. GCS verbal subscore is 5. GCS motor subscore is 6.     Cranial Nerves: No cranial nerve deficit, dysarthria or facial asymmetry.     Motor: No weakness, tremor, atrophy or abnormal muscle tone.     Gait: Gait is intact. Gait normal.  Psychiatric:        Attention and Perception: Attention and perception normal.        Mood and Affect: Mood and affect normal.        Speech: Speech normal.        Behavior: Behavior normal. Behavior is cooperative.        Thought Content: Thought content normal.        Cognition and  Memory: Cognition normal.        Judgment: Judgment normal.       No results found for any visits on 01/01/23.  Assessment & Plan      Return in about 1 week (around 01/08/2023) for HYpotension, headaches (with PCP) .       Problem List Items Addressed This Visit       Cardiovascular and Mediastinum   Neurogenic orthostatic hypotension (HCC) - Primary    Acute on chronic, ongoing Patient reports that she is having persistent low blood pressures along with dizziness, headaches, intermittent confusion. She denies syncope or falls She also reports persistent headache that I suspect is likely secondary to decreased blood pressure She has been taking midodrine 5 mg p.o. 3 times daily as directed without much improvement to her symptoms BP today in office is 94/68 Reviewed ED visit notes from 12/28/2022-recheck magnesium, CMP, CBC Recommend that she increase his midodrine to 10 mg p.o. twice daily and takes 5 mg at night for now.  Recommend that she stays well-hydrated and does not restrict salt intake until instructed otherwise She reports she has a history of migraines but states that her headaches do not feel similar to that and are not responding to over-the-counter NSAIDs or Tylenol. She reports that she used to take sumatriptan.  Chart review demonstrates that her most recent prescription for this was 2023. Given the vasoconstrictive nature of sumatriptan will provide prescription for 25 mg tablets every 2 hours as needed up to 2 doses per day Recommend that she is follows up with her PCP in 1 week for close monitoring. Reviewed ED and return precautions with strict emergency room parameters were reviewed Recommend that she reaches out to schedule neurology appointment Continue close follow-up      Relevant Orders   COMPLETE METABOLIC PANEL WITH GFR   CBC w/Diff/Platelet   Magnesium   Other Visit Diagnoses     Chronic intractable headache, unspecified headache type        Relevant Medications   SUMAtriptan (IMITREX) 25 MG tablet        Return in about 1 week (around 01/08/2023) for HYpotension, headaches (with PCP) .   I, Dilcia Rybarczyk E Wilman Tucker, PA-C, have reviewed all documentation for this visit. The documentation on 01/01/23 for the exam, diagnosis, procedures, and orders are all accurate and  complete.   Jacquelin Hawking, MHS, PA-C [facility]

## 2023-01-01 NOTE — Telephone Encounter (Signed)
     Chief Complaint: Low BP, has been on medication "for it." Yesterday 90/56. Symptoms: Above Frequency: Several days Pertinent Negatives: Patient denies any other symptoms Disposition: [] ED /[] Urgent Care (no appt availability in office) / [x] Appointment(In office/virtual)/ []  [place] [facility]/ [] Home Care/ [] Refused Recommended Disposition /[] [place] [facility]/ []  Follow-up with PCP Additional Notes: Pt. Agrees with appointment. Reason for Disposition  [1] Systolic BP 90-110 AND [2] taking blood pressure medications AND [3] NOT dizzy, lightheaded or weak  Answer Assessment - Initial Assessment Questions 1. BLOOD PRESSURE: "What is the blood pressure?" "Did you take at least two measurements 5 minutes apart?"     90/56 2. ONSET: "When did you take your blood pressure?"     1 Week ago 3. HOW: "How did you obtain the blood pressure?" (e.g., visiting nurse, automatic home BP monitor)     Home cuff 4. HISTORY: "Do you have a history of low blood pressure?" "What is your blood pressure normally?"     Yes 5. MEDICINES: "Are you taking any medications for blood pressure?" If Yes, ask: "Have they been changed recently?"     Low BP 6. PULSE RATE: "Do you know what your pulse rate is?"      uNSURE 7. OTHER SYMPTOMS: "Have you been sick recently?" "Have you had a recent injury?"     No 8. PREGNANCY: "Is there any chance you are pregnant?" "When was your last menstrual period?"     No  Protocols used: Blood Pressure - Low-A-AH

## 2023-01-02 LAB — CBC WITH DIFFERENTIAL/PLATELET
Absolute Lymphocytes: 3159 {cells}/uL (ref 850–3900)
Absolute Monocytes: 607 {cells}/uL (ref 200–950)
Basophils Absolute: 53 {cells}/uL (ref 0–200)
Basophils Relative: 0.6 %
Eosinophils Absolute: 123 {cells}/uL (ref 15–500)
Eosinophils Relative: 1.4 %
HCT: 42.2 % (ref 35.0–45.0)
Hemoglobin: 13.4 g/dL (ref 11.7–15.5)
MCH: 27.9 pg (ref 27.0–33.0)
MCHC: 31.8 g/dL — ABNORMAL LOW (ref 32.0–36.0)
MCV: 87.9 fL (ref 80.0–100.0)
MPV: 12.7 fL — ABNORMAL HIGH (ref 7.5–12.5)
Monocytes Relative: 6.9 %
Neutro Abs: 4858 {cells}/uL (ref 1500–7800)
Neutrophils Relative %: 55.2 %
Platelets: 220 10*3/uL (ref 140–400)
RBC: 4.8 10*6/uL (ref 3.80–5.10)
RDW: 11.9 % (ref 11.0–15.0)
Total Lymphocyte: 35.9 %
WBC: 8.8 10*3/uL (ref 3.8–10.8)

## 2023-01-02 LAB — COMPLETE METABOLIC PANEL WITH GFR
AG Ratio: 1.4 (calc) (ref 1.0–2.5)
ALT: 14 U/L (ref 6–29)
AST: 18 U/L (ref 10–35)
Albumin: 3.8 g/dL (ref 3.6–5.1)
Alkaline phosphatase (APISO): 53 U/L (ref 31–125)
BUN: 21 mg/dL (ref 7–25)
CO2: 30 mmol/L (ref 20–32)
Calcium: 9.4 mg/dL (ref 8.6–10.2)
Chloride: 101 mmol/L (ref 98–110)
Creat: 0.94 mg/dL (ref 0.50–0.99)
Globulin: 2.7 g/dL (ref 1.9–3.7)
Glucose, Bld: 94 mg/dL (ref 65–99)
Potassium: 4.8 mmol/L (ref 3.5–5.3)
Sodium: 138 mmol/L (ref 135–146)
Total Bilirubin: 0.5 mg/dL (ref 0.2–1.2)
Total Protein: 6.5 g/dL (ref 6.1–8.1)
eGFR: 75 mL/min/{1.73_m2} (ref >=60)

## 2023-01-02 LAB — MAGNESIUM: Magnesium: 1.5 mg/dL (ref 1.5–2.5)

## 2023-01-02 NOTE — Progress Notes (Signed)
Your CBC was overall normal, no signs of anemia Your electrolytes, liver and kidney function were normal ranges Your magnesium was normal

## 2023-01-08 ENCOUNTER — Ambulatory Visit: Payer: BC Managed Care – PPO | Admitting: Family Medicine

## 2023-01-09 IMAGING — CR DG SHOULDER 2+V*R*
1 series · 3 of 3 positions shown · non-contrast
Comparison: None.

CLINICAL DATA: Chronic shoulder pain.

EXAM:
RIGHT SHOULDER - 2+ VIEW

[Series 1: dg shoulder right · 0.14mm/px · 3 of 3 slices shown]
[im 1/3]
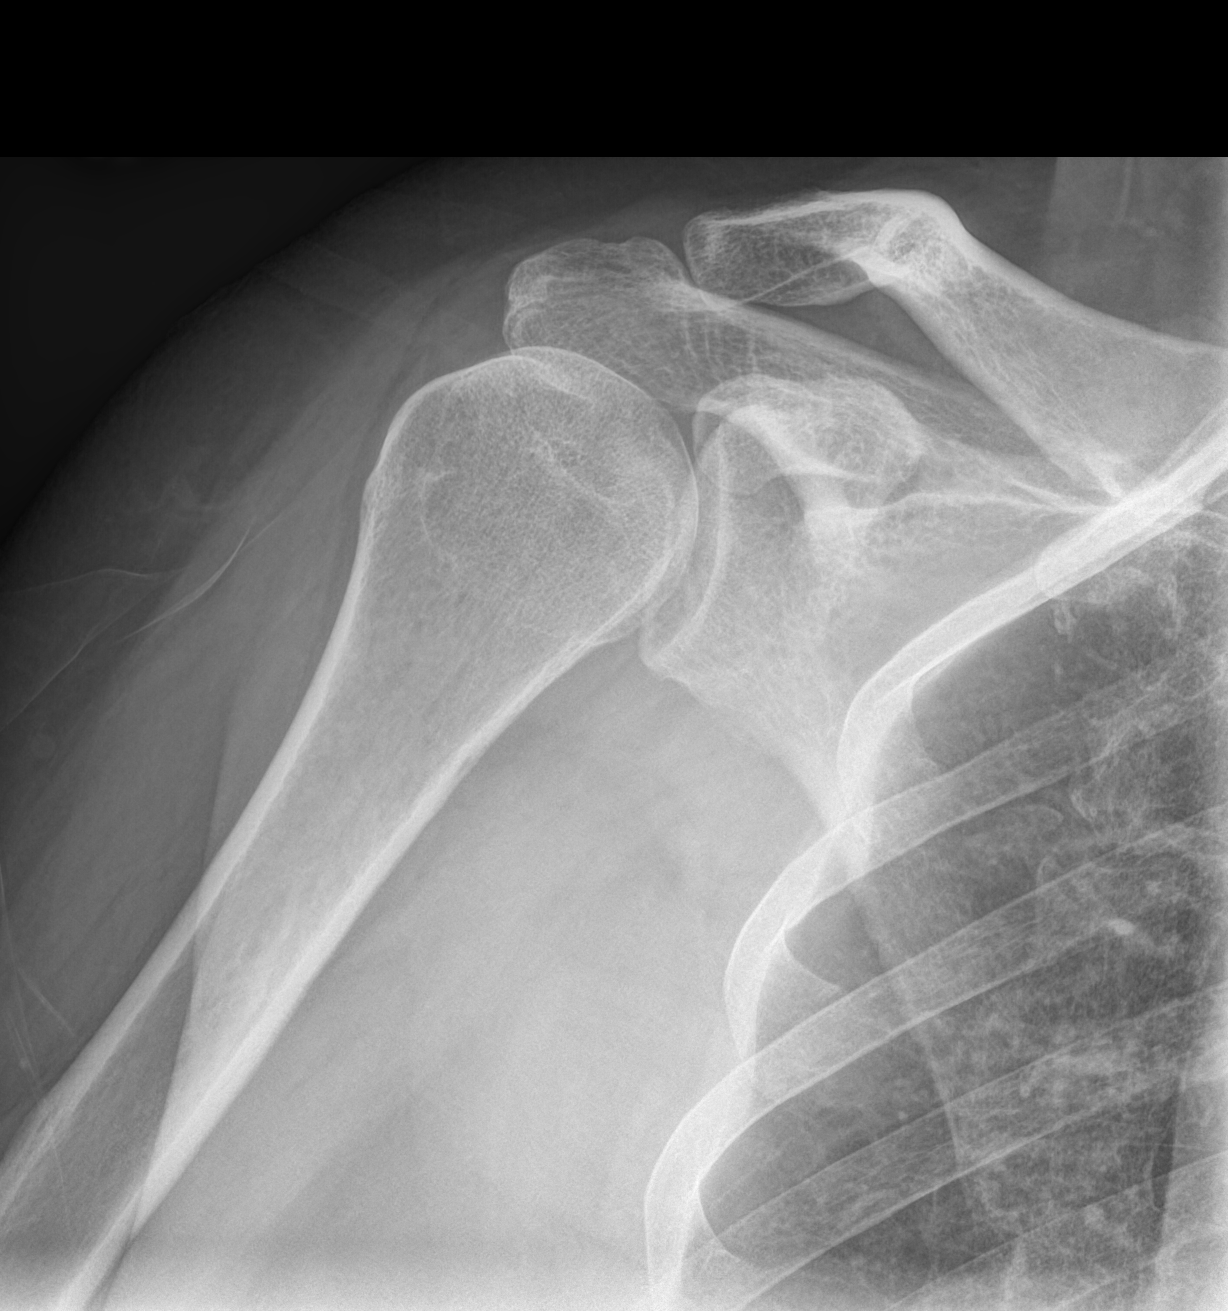
[im 2/3]
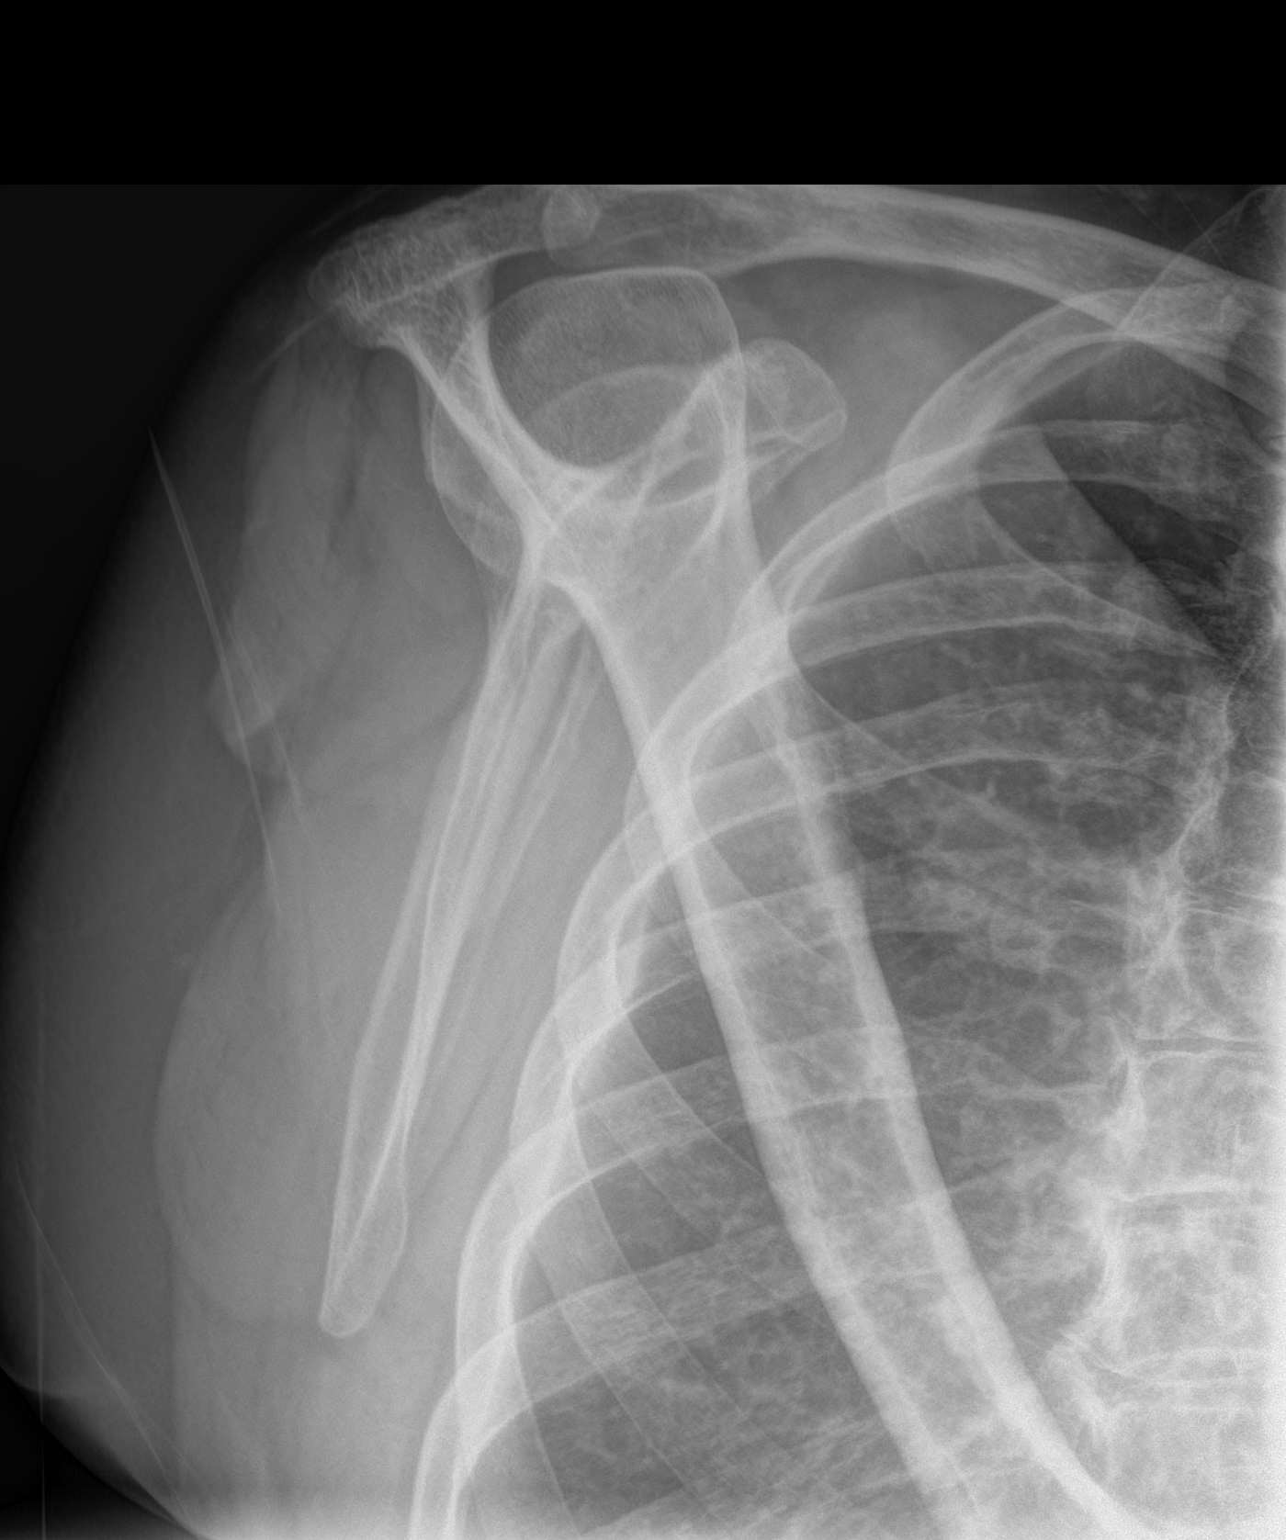
[im 3/3]
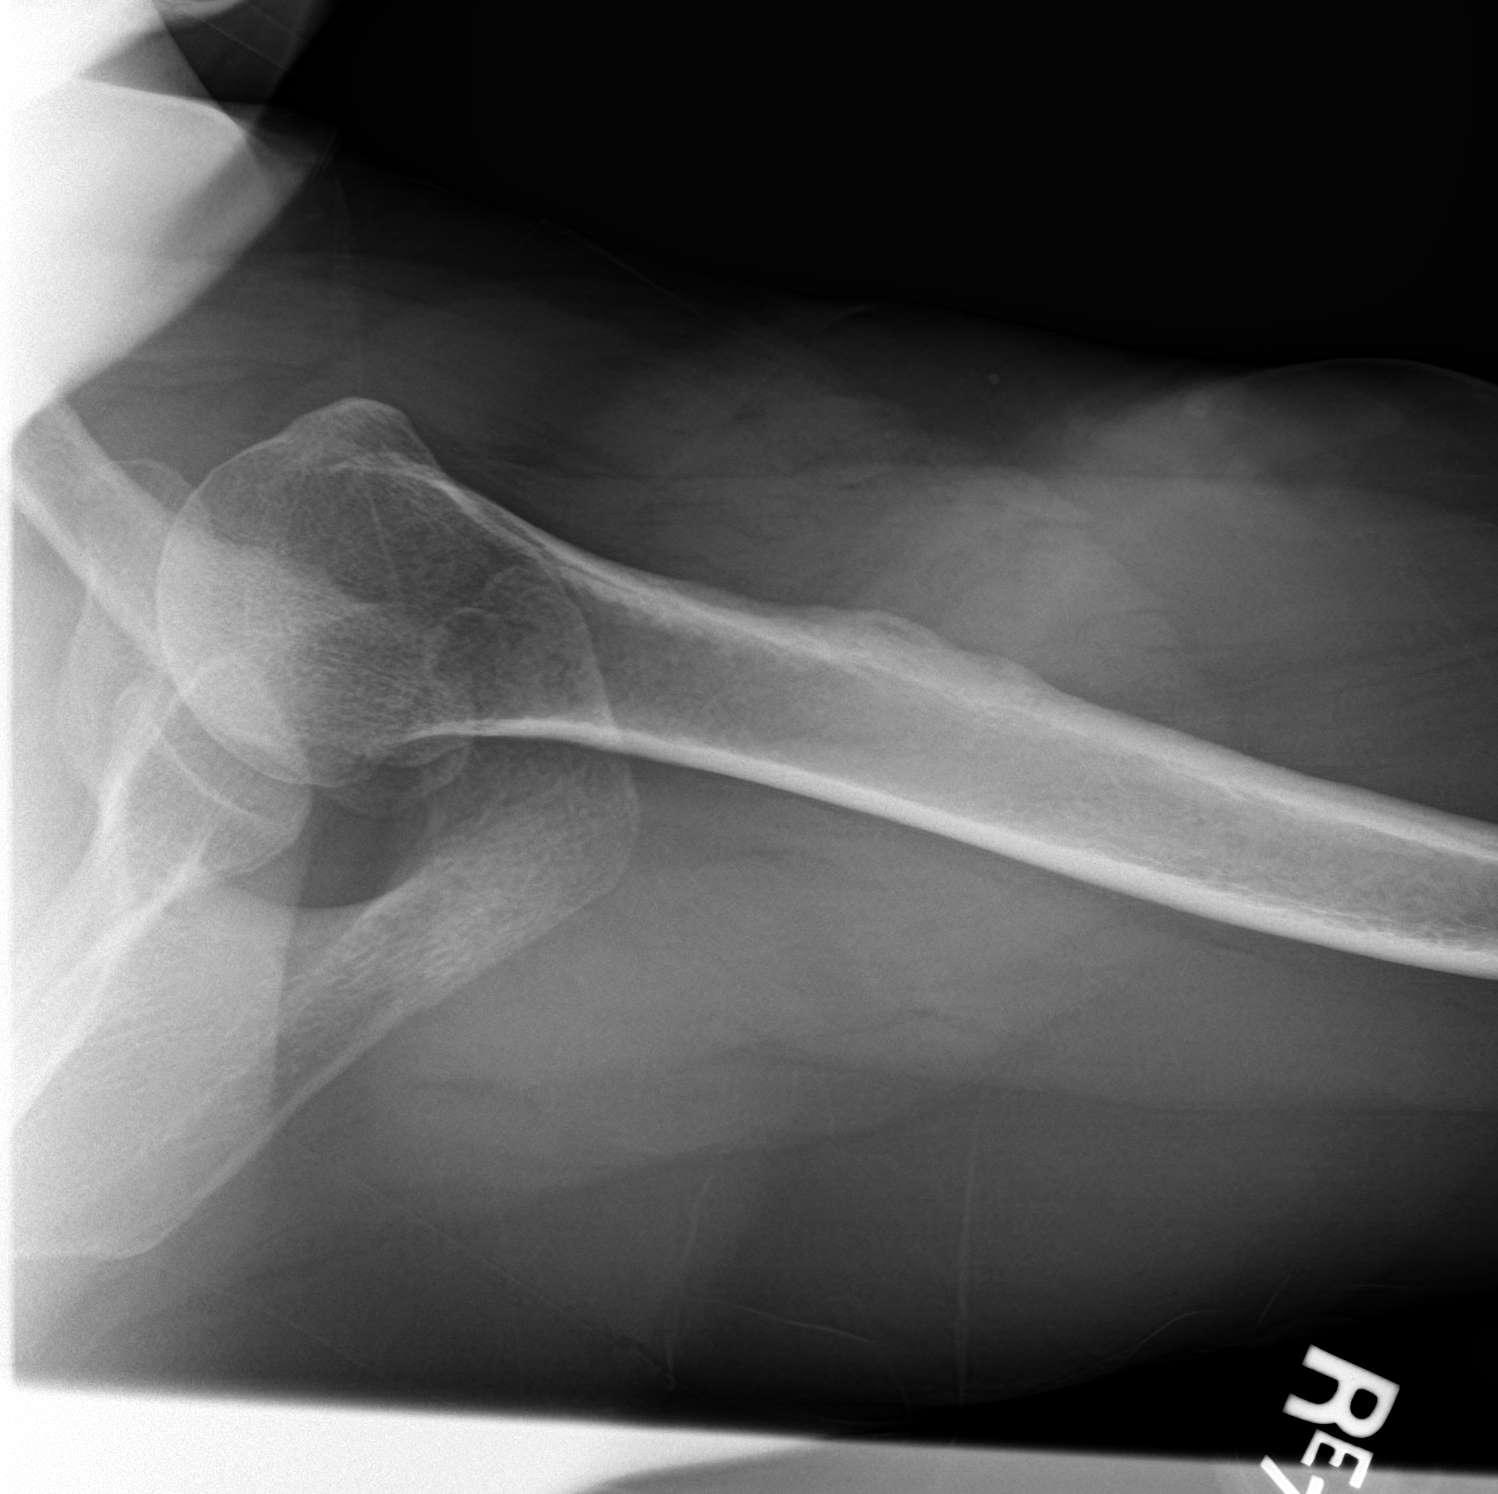

[3 of 3 positions shown; findings below may reference images not displayed]

FINDINGS: There is no evidence of acute fracture or joint malalignment.
Glenohumeral and acromioclavicular joint spaces are maintained.
Localized cortical thickening of the proximal humerus, likely
related to deltoid insertion tug lesion. Soft tissues are
unremarkable.
IMPRESSION: 1. No acute fracture or joint malalignment.
2. Localized cortical thickening of the proximal humerus, likely
related to deltoid insertion tug lesion.

## 2023-01-22 DIAGNOSIS — F900 Attention-deficit hyperactivity disorder, predominantly inattentive type: Secondary | ICD-10-CM | POA: Diagnosis not present

## 2023-01-22 DIAGNOSIS — F319 Bipolar disorder, unspecified: Secondary | ICD-10-CM | POA: Diagnosis not present

## 2023-01-22 DIAGNOSIS — F410 Panic disorder [episodic paroxysmal anxiety] without agoraphobia: Secondary | ICD-10-CM | POA: Diagnosis not present

## 2023-01-23 DIAGNOSIS — F3181 Bipolar II disorder: Secondary | ICD-10-CM | POA: Diagnosis not present

## 2023-01-23 DIAGNOSIS — Z79891 Long term (current) use of opiate analgesic: Secondary | ICD-10-CM | POA: Diagnosis not present

## 2023-01-29 ENCOUNTER — Other Ambulatory Visit: Payer: Self-pay | Admitting: Family Medicine

## 2023-01-29 ENCOUNTER — Telehealth: Payer: Self-pay

## 2023-01-29 DIAGNOSIS — G8929 Other chronic pain: Secondary | ICD-10-CM

## 2023-01-29 DIAGNOSIS — M5416 Radiculopathy, lumbar region: Secondary | ICD-10-CM

## 2023-01-29 DIAGNOSIS — G903 Multi-system degeneration of the autonomic nervous system: Secondary | ICD-10-CM

## 2023-01-29 MED ORDER — OXYCODONE-ACETAMINOPHEN 10-325 MG PO TABS: 1.0000 | ORAL_TABLET | Freq: Three times a day (TID) | ORAL | 0 refills | Status: AC | PRN

## 2023-01-29 MED ORDER — SUMATRIPTAN SUCCINATE 25 MG PO TABS: 25.0000 mg | ORAL_TABLET | ORAL | 1 refills | Status: DC | PRN

## 2023-01-29 MED ORDER — MIDODRINE HCL 5 MG PO TABS: ORAL_TABLET | ORAL | 2 refills | Status: DC

## 2023-01-29 NOTE — Telephone Encounter (Signed)
Requested Prescriptions  Pending Prescriptions Disp Refills   SUMAtriptan (IMITREX) 25 MG tablet 6 tablet 1    Sig: Take 1 tablet (25 mg total) by mouth every 2 (two) hours as needed for migraine. May repeat in 2 hours if headache persists or recurs.     Neurology:  Migraine Therapy - Triptan Passed - 01/29/2023 10:36 AM      Passed - Last BP in normal range    BP Readings from Last 1 Encounters:  01/01/23 94/68         Passed - Valid encounter within last 12 months    Recent Outpatient Visits           4 weeks ago Neurogenic orthostatic hypotension (HCC)   [facility] Mecum, Erin E, PA-C   1 month ago Neurogenic orthostatic hypotension [facility])   [facility] Jacky Kindle, FNP   1 month ago Annual physical exam   [facility] Merita Norton T, FNP   1 year ago Vertigo   [facility] Alfredia Ferguson, PA-C   2 years ago Chronic right shoulder pain   [facility] Chrismon, Jodell [place], [place]       Future Appointments             In 1 month Agbor-Etang, Arlys John, MD [facility]             midodrine (PROAMATINE) 5 MG tablet      Sig: Take 2 tablets (10 mg total) by mouth 2 (two) times daily with a meal AND 1 tablet (5 mg total) at bedtime.     Not Delegated - Cardiovascular: Midodrine Failed - 01/29/2023 10:36 AM      Failed - This refill cannot be delegated      Passed - Cr in normal range and within 360 days    Creat  Date Value Ref Range Status  01/01/2023 0.94 0.50 - 0.99 mg/dL Final         Passed - ALT in normal range and within 360 days    ALT  Date Value Ref Range Status  01/01/2023 14 6 - 29 U/L Final   SGPT (ALT)  Date Value Ref Range Status  05/28/2013 16 12 - 78 U/L Final         Passed - AST in normal range and within 360 days    AST  Date Value Ref Range Status  01/01/2023 18 10 - 35 U/L Final    SGOT(AST)  Date Value Ref Range Status  05/28/2013 13 (L) 15 - 37 Unit/L Final         Passed - Last BP in normal range    BP Readings from Last 1 Encounters:  01/01/23 94/68         Passed - Valid encounter within last 12 months    Recent Outpatient Visits           4 weeks ago Neurogenic orthostatic hypotension (HCC)   [facility] Mecum, Erin E, PA-C   1 month ago Neurogenic orthostatic hypotension [facility])    [facility] Jacky Kindle, FNP   1 month ago Annual physical exam   [facility] Jacky Kindle, FNP   1 year ago Vertigo   [facility] Alfredia Ferguson, PA-C   2 years ago Chronic right shoulder pain   [facility] Chrismon, Jodell [place], [place]       Future Appointments             In 1 month Agbor-Etang, Arlys John, MD [facility]

## 2023-01-29 NOTE — Telephone Encounter (Signed)
Patient called and advised Oxycodone 5 mg is not on her current medication list. This was prescribed by ED provider. She says that Robynn Pane prescribed Oxycodone when she saw her and that's what she needs to be refilled. Advised it was oxycodone (Percocet) 10-325 mg and that was only 15 pills and the expiration date on the Rx was 12/28/22. She says she would like that refilled, if possible, because it helps with her back pain and she will be seeing a specialist in December. Advised I will send this to [place].   Medication Refill -  Most Recent Primary Care Visit:  Provider: Jacquelin Hawking E  Department: [facility]  Visit Type: OFFICE VISIT  Date: 01/01/2023  Medication:   oxyCODONE (ROXICODONE) 5 MG (back pain) has appt w/ back specialist in Dec.  Has the patient contacted their pharmacy? No Pt sates she requested throgh Mychart, but nothing happened  Is this the correct pharmacy for this prescription? No  If no, delete pharmacy and type the correct one.  This is the patient's preferred pharmacy:  [id number] - [place] ([place]), [place] - [address]    Has the prescription been filled recently? Yes   Is the patient out of the medication? Yes   Has the patient been seen for an appointment in the last year OR does the patient have an upcoming appointment? Yes  Can we respond through MyChart? Yes   Agent: Please be advised that Rx refills may take up to 3 business days. We ask that you follow-up with your pharmacy.

## 2023-01-29 NOTE — Telephone Encounter (Signed)
Patient called and advised Oxycodone 5 mg is not on her current medication list. This was prescribed by ED provider. She says that Robynn Pane prescribed Oxycodone when she saw her and that's what she needs to be refilled. Advised it was oxycodone (Percocet) 10-325 mg and that was only 15 pills and the expiration date on the Rx was 12/28/22. She says she would like that refilled, if possible, because it helps with her back pain and she will be seeing a specialist in December. Advised I will send this to [place]. This request is sent in a separate encounter to the office, since it's not on her current list and she's also requesting medications on her current list.

## 2023-01-29 NOTE — Telephone Encounter (Signed)
Medication Refill -  Most Recent Primary Care Visit:  Provider: Jacquelin Hawking E  Department: [facility]  Visit Type: OFFICE VISIT  Date: 01/01/2023  Medication:  SUMAtriptan (IMITREX) 25 MG table midodrine (PROAMATINE) 5 MG tablet (pt states Erin increased this medication to 10 mg BID, and 5 mg at night.) oxyCODONE (ROXICODONE) 5 MG (back pain) has appt w/ back specialist in Dec. Has the patient contacted their pharmacy? No Pt sates she requested throgh Mychart, but nothing happened Is this the correct pharmacy for this prescription? No If no, delete pharmacy and type the correct one.  This is the patient's preferred pharmacy: [id number] - [place] ([address]), [place] - [address]   Has the prescription been filled recently? Yes  Is the patient out of the medication? Yes  Has the patient been seen for an appointment in the last year OR does the patient have an upcoming appointment? Yes  Can we respond through MyChart? Yes  Agent: Please be advised that Rx refills may take up to 3 business days. We ask that you follow-up with your pharmacy.

## 2023-01-29 NOTE — Telephone Encounter (Signed)
Requested medication (s) are due for refill today: Yes  Requested medication (s) are on the active medication list: Yes  Last refill:  01/01/23  Future visit scheduled: No  Notes to clinic:  Unable to refill per protocol, cannot delegate. midodrine (PROAMATINE) 5 MG tablet (pt states Erin increased this medication to 10 mg BID, and 5 mg at night.) New Rx is needed.     Requested Prescriptions  Pending Prescriptions Disp Refills   midodrine (PROAMATINE) 5 MG tablet      Sig: Take 2 tablets (10 mg total) by mouth 2 (two) times daily with a meal AND 1 tablet (5 mg total) at bedtime.     Not Delegated - Cardiovascular: Midodrine Failed - 01/29/2023 10:36 AM      Failed - This refill cannot be delegated      Passed - Cr in normal range and within 360 days    Creat  Date Value Ref Range Status  01/01/2023 0.94 0.50 - 0.99 mg/dL Final         Passed - ALT in normal range and within 360 days    ALT  Date Value Ref Range Status  01/01/2023 14 6 - 29 U/L Final   SGPT (ALT)  Date Value Ref Range Status  05/28/2013 16 12 - 78 U/L Final         Passed - AST in normal range and within 360 days    AST  Date Value Ref Range Status  01/01/2023 18 10 - 35 U/L Final   SGOT(AST)  Date Value Ref Range Status  05/28/2013 13 (L) 15 - 37 Unit/L Final         Passed - Last BP in normal range    BP Readings from Last 1 Encounters:  01/01/23 94/68         Passed - Valid encounter within last 12 months    Recent Outpatient Visits           4 weeks ago Neurogenic orthostatic hypotension (HCC)   [place] [facility] Mecum, Erin E, PA-C   1 month ago Neurogenic orthostatic hypotension [facility])   [place] [facility] Jacky Kindle, FNP   1 month ago Annual physical exam   [facility] Jacky Kindle, FNP   1 year ago Vertigo   [place] [facility] Alfredia Ferguson, PA-C   2 years ago Chronic right shoulder  pain   [place] [facility] Chrismon, Jodell Cipro, PA-C       Future Appointments             In 1 month Agbor-Etang, Arlys John, MD  HeartCare at [facility]            Signed Prescriptions Disp Refills   SUMAtriptan (IMITREX) 25 MG tablet 6 tablet 1    Sig: Take 1 tablet (25 mg total) by mouth every 2 (two) hours as needed for migraine. May repeat in 2 hours if headache persists or recurs.     Neurology:  Migraine Therapy - Triptan Passed - 01/29/2023 10:36 AM      Passed - Last BP in normal range    BP Readings from Last 1 Encounters:  01/01/23 94/68         Passed - Valid encounter within last 12 months    Recent Outpatient Visits           4 weeks ago Neurogenic orthostatic hypotension (HCC)   [facility] Mecum, Erin E, PA-C   1 month ago Neurogenic orthostatic hypotension [facility])   [place] [facility] Jacky Kindle, FNP   1 month ago Annual physical exam   [facility] Jacky Kindle, FNP   1 year ago Vertigo   [facility] Alfredia Ferguson, PA-C   2 years ago Chronic right shoulder pain   [facility] Chrismon, Jodell Cipro, PA-C       Future Appointments             In 1 month Agbor-Etang, Arlys John, MD [facility] at [facility]

## 2023-02-04 ENCOUNTER — Other Ambulatory Visit: Payer: Self-pay | Admitting: Family Medicine

## 2023-02-04 DIAGNOSIS — G903 Multi-system degeneration of the autonomic nervous system: Secondary | ICD-10-CM

## 2023-02-04 DIAGNOSIS — M5416 Radiculopathy, lumbar region: Secondary | ICD-10-CM

## 2023-02-04 MED ORDER — MIDODRINE HCL 5 MG PO TABS: ORAL_TABLET | ORAL | 2 refills | Status: DC

## 2023-02-04 NOTE — Telephone Encounter (Signed)
Pt requests Rx refill for midodrine (PROAMATINE) 5 MG tablet and it shows as print. Pt requests that the Rx be sent to  [facility] [address], [place] - [place] [facility] [address] Phone: [phone]  Fax: [phone]

## 2023-02-04 NOTE — Telephone Encounter (Signed)
Requested Prescriptions  Pending Prescriptions Disp Refills   midodrine (PROAMATINE) 5 MG tablet 150 tablet 2    Sig: Take 2 tablets (10 mg total) by mouth 2 (two) times daily with a meal AND 1 tablet (5 mg total) at bedtime. Take 2 tablets (10 mg total) by mouth 2 (two) times daily with a meal AND 1 tablet (5 mg total) at bedtime..     Not Delegated - Cardiovascular: Midodrine Failed - 02/04/2023  4:55 PM      Failed - This refill cannot be delegated      Passed - Cr in normal range and within 360 days    Creat  Date Value Ref Range Status  01/01/2023 0.94 0.50 - 0.99 mg/dL Final         Passed - ALT in normal range and within 360 days    ALT  Date Value Ref Range Status  01/01/2023 14 6 - 29 U/L Final   SGPT (ALT)  Date Value Ref Range Status  05/28/2013 16 12 - 78 U/L Final         Passed - AST in normal range and within 360 days    AST  Date Value Ref Range Status  01/01/2023 18 10 - 35 U/L Final   SGOT(AST)  Date Value Ref Range Status  05/28/2013 13 (L) 15 - 37 Unit/L Final         Passed - Last BP in normal range    BP Readings from Last 1 Encounters:  01/01/23 94/68         Passed - Valid encounter within last 12 months    Recent Outpatient Visits           1 month ago Neurogenic orthostatic hypotension (HCC)   [place] [facility] Mecum, Erin E, PA-C   1 month ago Neurogenic orthostatic hypotension [facility])   [place] Jacky Kindle, FNP   1 month ago Annual physical exam   [facility] Jacky Kindle, FNP   1 year ago Vertigo   [facility] Alfredia Ferguson, PA-C   2 years ago Chronic right shoulder pain   [place] Chrismon, Jodell Cipro, [place]       Future Appointments             In 1 month Agbor-Etang, Arlys John, MD [facility] at [facility]

## 2023-03-02 ENCOUNTER — Telehealth: Payer: Self-pay | Admitting: *Deleted

## 2023-03-02 NOTE — Telephone Encounter (Signed)
 LMOVM to verify card hx

## 2023-03-05 ENCOUNTER — Ambulatory Visit: Payer: Self-pay | Admitting: *Deleted

## 2023-03-05 ENCOUNTER — Ambulatory Visit: Payer: Self-pay

## 2023-03-05 ENCOUNTER — Telehealth: Payer: Self-pay | Admitting: Family Medicine

## 2023-03-05 NOTE — Telephone Encounter (Signed)
Left message to call back about symptoms. °

## 2023-03-05 NOTE — Telephone Encounter (Signed)
  Chief Complaint: chest discomfort, cough, sore throat Symptoms: chest discomfort, cough productive clear thick. Sore throat. Headache chills Frequency: 2 days  Pertinent Negatives: Patient denies chest pain with dizziness , sweating difficulty breathing . Disposition: [] ED /[] Urgent Care (no appt availability in office) / [] Appointment(In office/virtual)/ []  [place] [facility]/ [] Home Care/ [x] Refused Recommended Disposition /[] [place] [facility]/ []  Follow-up with PCP Additional Notes:   Recommended ED and patient reports she is calling to get antibiotics due to sx and she does not have a co pay she can pay. Please advise PCP recommendations.   Summary: poss bronchitis     Pt has sore throat, no fever, cough, and states this is bronchitis, she gets it twice a year.   Would like abx called in to [facility]        Attempted to call pt back x 2, recording that was not able to connect with the number that is dialed. Rechecked the number and same message.         Reason for Disposition  [1] MODERATE difficulty breathing (e.g., speaks in phrases, SOB even at rest, pulse 100-120) AND [2] still present when not coughing  Answer Assessment - Initial Assessment Questions 1. ONSET: "When did the cough begin?"      2 days ago  2. SEVERITY: "How bad is the cough today?"      Cough with chest discomfort 3. SPUTUM: "Describe the color of your sputum" (none, dry cough; clear, white, yellow, green)     Clear thick  4. HEMOPTYSIS: "Are you coughing up any blood?" If so ask: "How much?" (flecks, streaks, tablespoons, etc.)     Na  5. DIFFICULTY BREATHING: "Are you having difficulty breathing?" If Yes, ask: "How bad is it?" (e.g., mild, moderate, severe)    - MILD: No SOB at rest, mild SOB with walking, speaks normally in sentences, can lie down, no retractions, pulse < 100.    - MODERATE: SOB at rest, SOB with minimal exertion and prefers to sit, cannot lie down flat, speaks in  phrases, mild retractions, audible wheezing, pulse 100-120.    - SEVERE: Very SOB at rest, speaks in single words, struggling to breathe, sitting hunched forward, retractions, pulse > 120      Chest pain/ discomfort all of the time. 6. FEVER: "Do you have a fever?" If Yes, ask: "What is your temperature, how was it measured, and when did it start?"     Unknown chills reported 7. CARDIAC HISTORY: "Do you have any history of heart disease?" (e.g., heart attack, congestive heart failure)      Na  8. LUNG HISTORY: "Do you have any history of lung disease?"  (e.g., pulmonary embolus, asthma, emphysema)     Ns  9. PE RISK FACTORS: "Do you have a history of blood clots?" (or: recent major surgery, recent prolonged travel, bedridden)     Na  10. OTHER SYMPTOMS: "Do you have any other symptoms?" (e.g., runny nose, wheezing, chest pain)       Cough clear thick mucus. Sore throat headache. Chest discomfort 11. PREGNANCY: "Is there any chance you are pregnant?" "When was your last menstrual period?"       na 12. TRAVEL: "Have you traveled out of the country in the last month?" (e.g., travel history, exposures)       na  Protocols used: Cough - Acute Productive-A-AH

## 2023-03-05 NOTE — Telephone Encounter (Signed)
Message from Harrison H sent at 03/05/2023  8:21 AM EST  Summary: poss bronchitis   Pt has sore throat, no fever, cough, and states this is bronchitis, she gets it twice a year.   Would like abx called in to [facility]        Attempted to call pt back x 2, recording that was not able to connect with the number that is dialed. Rechecked the number and same message.

## 2023-03-05 NOTE — Telephone Encounter (Signed)
A user error has taken place: encounter opened in error, closed for administrative reasons.

## 2023-03-05 NOTE — Telephone Encounter (Signed)
Third attempt to reach pt. Left message to call back.

## 2023-03-06 DIAGNOSIS — R001 Bradycardia, unspecified: Secondary | ICD-10-CM | POA: Diagnosis not present

## 2023-03-06 DIAGNOSIS — R9431 Abnormal electrocardiogram [ECG] [EKG]: Secondary | ICD-10-CM | POA: Diagnosis not present

## 2023-03-06 DIAGNOSIS — F17210 Nicotine dependence, cigarettes, uncomplicated: Secondary | ICD-10-CM | POA: Diagnosis not present

## 2023-03-06 DIAGNOSIS — J449 Chronic obstructive pulmonary disease, unspecified: Secondary | ICD-10-CM | POA: Diagnosis not present

## 2023-03-06 DIAGNOSIS — R059 Cough, unspecified: Secondary | ICD-10-CM | POA: Diagnosis not present

## 2023-03-06 DIAGNOSIS — R52 Pain, unspecified: Secondary | ICD-10-CM | POA: Diagnosis not present

## 2023-03-06 DIAGNOSIS — Z5941 Food insecurity: Secondary | ICD-10-CM | POA: Diagnosis not present

## 2023-03-06 DIAGNOSIS — U071 COVID-19: Secondary | ICD-10-CM | POA: Diagnosis not present

## 2023-03-06 DIAGNOSIS — J029 Acute pharyngitis, unspecified: Secondary | ICD-10-CM | POA: Diagnosis not present

## 2023-03-06 DIAGNOSIS — R0989 Other specified symptoms and signs involving the circulatory and respiratory systems: Secondary | ICD-10-CM | POA: Diagnosis not present

## 2023-03-06 DIAGNOSIS — R509 Fever, unspecified: Secondary | ICD-10-CM | POA: Diagnosis not present

## 2023-03-10 ENCOUNTER — Ambulatory Visit: Payer: BC Managed Care – PPO | Admitting: Cardiology

## 2023-03-19 DIAGNOSIS — F410 Panic disorder [episodic paroxysmal anxiety] without agoraphobia: Secondary | ICD-10-CM | POA: Diagnosis not present

## 2023-03-19 DIAGNOSIS — F411 Generalized anxiety disorder: Secondary | ICD-10-CM | POA: Diagnosis not present

## 2023-03-19 DIAGNOSIS — F900 Attention-deficit hyperactivity disorder, predominantly inattentive type: Secondary | ICD-10-CM | POA: Diagnosis not present

## 2023-03-19 DIAGNOSIS — F319 Bipolar disorder, unspecified: Secondary | ICD-10-CM | POA: Diagnosis not present

## 2023-04-07 DIAGNOSIS — F4310 Post-traumatic stress disorder, unspecified: Secondary | ICD-10-CM | POA: Diagnosis not present

## 2023-04-07 DIAGNOSIS — F411 Generalized anxiety disorder: Secondary | ICD-10-CM | POA: Diagnosis not present

## 2023-04-07 DIAGNOSIS — F900 Attention-deficit hyperactivity disorder, predominantly inattentive type: Secondary | ICD-10-CM | POA: Diagnosis not present

## 2023-04-07 DIAGNOSIS — F410 Panic disorder [episodic paroxysmal anxiety] without agoraphobia: Secondary | ICD-10-CM | POA: Diagnosis not present

## 2023-04-30 ENCOUNTER — Ambulatory Visit: Payer: Self-pay | Admitting: Family Medicine

## 2023-04-30 NOTE — Telephone Encounter (Signed)
Per note patient going to urgent care

## 2023-04-30 NOTE — Telephone Encounter (Signed)
Copied from CRM [id number]. Topic: Clinical - Red Word Triage >> Apr 30, 2023  9:47 AM Elle L wrote: Red Word that prompted transfer to Nurse Triage: The patient states that her ears have been itching for weeks and they have been worsening. She states it does not hurt but she is unsure of the cause and it has not happened before.   Chief Complaint: Itching Symptoms: Itching inside ears Frequency: Ongoing for several weeks Pertinent Negatives: Patient denies rash, redness, ear discharge, pain Disposition: [] ED /[x] Urgent Care (no appt availability in office) / [] Appointment(In office/virtual)/ []  [place] [facility]/ [] Home Care/ [] Refused Recommended Disposition /[] [place] [facility]/ []  Follow-up with PCP Additional Notes: Patient stated that she is having itching inside her ears and it started several weeks ago, but it seems to be getting worse. She denies having any other symptoms. Offered to schedule appointment, but none of the options work with patient's schedule. Patient will go to urgent care.  Reason for Disposition  [1] Cause unknown AND [2] present > 7 days  Answer Assessment - Initial Assessment Questions 1. DESCRIPTION: "Describe the itching you are having." "Where is it located?"     Itching inside the ears  2. SEVERITY: "How bad is it?"    - MILD: Doesn't interfere with normal activities.   - MODERATE-SEVERE: Interferes with work, school, sleep, or other activities.      Can get severe at times  3. ONSET: "When did the itching begin?"      Several weeks ago and is worsening now  4. CAUSE: "What do you think is causing the itching?"      Unknown  5. OTHER SYMPTOMS: "Do you have any other symptoms?"      No  Protocols used: Itching - Localized-A-AH

## 2023-05-04 ENCOUNTER — Other Ambulatory Visit: Payer: Self-pay | Admitting: Family Medicine

## 2023-05-04 DIAGNOSIS — F3163 Bipolar disorder, current episode mixed, severe, without psychotic features: Secondary | ICD-10-CM

## 2023-05-04 NOTE — Telephone Encounter (Signed)
 Last Fill: 12/18/22  Last OV: 12/23/22 Next OV: None Scheduled   Routing to provider for review/authorization.

## 2023-05-04 NOTE — Telephone Encounter (Signed)
 Copied from CRM [id number]. Topic: Clinical - Medication Refill >> May 04, 2023  9:16 AM Franchot Heidelberg wrote: Most Recent Primary Care Visit:  Provider: [facility] Crosby  Department: [facility]  Visit Type: OFFICE VISIT  Date: 01/01/2023  Medication: traZODone (DESYREL) 100 MG tablet  Has the patient contacted their pharmacy? Yes (Agent: If no, request that the patient contact the pharmacy for the refill. If patient does not wish to contact the pharmacy document the reason why and proceed with request.) (Agent: If yes, when and what did the pharmacy advise?)  Is this the correct pharmacy for this prescription? Yes If no, delete pharmacy and type the correct one.  This is the patient's preferred pharmacy:   [facility] [address], [place] - [address] [place] [address] [place] [address] Phone: [phone] Fax: [phone]   Has the prescription been filled recently? Yes  Is the patient out of the medication? Yes  Has the patient been seen for an appointment in the last year OR does the patient have an upcoming appointment? Yes  Can we respond through MyChart? Yes  Agent: Please be advised that Rx refills may take up to 3 business days. We ask that you follow-up with your pharmacy.

## 2023-05-08 ENCOUNTER — Ambulatory Visit: Payer: BC Managed Care – PPO | Attending: Cardiology | Admitting: Cardiology

## 2023-05-12 ENCOUNTER — Ambulatory Visit: Payer: BC Managed Care – PPO | Admitting: Family Medicine

## 2023-05-22 DIAGNOSIS — Z888 Allergy status to other drugs, medicaments and biological substances status: Secondary | ICD-10-CM | POA: Diagnosis not present

## 2023-05-22 DIAGNOSIS — F419 Anxiety disorder, unspecified: Secondary | ICD-10-CM | POA: Diagnosis not present

## 2023-05-22 DIAGNOSIS — R519 Headache, unspecified: Secondary | ICD-10-CM | POA: Diagnosis not present

## 2023-05-22 DIAGNOSIS — Z882 Allergy status to sulfonamides status: Secondary | ICD-10-CM | POA: Diagnosis not present

## 2023-05-22 DIAGNOSIS — F319 Bipolar disorder, unspecified: Secondary | ICD-10-CM | POA: Diagnosis not present

## 2023-05-22 DIAGNOSIS — J449 Chronic obstructive pulmonary disease, unspecified: Secondary | ICD-10-CM | POA: Diagnosis not present

## 2023-05-22 DIAGNOSIS — Z881 Allergy status to other antibiotic agents status: Secondary | ICD-10-CM | POA: Diagnosis not present

## 2023-05-22 DIAGNOSIS — R110 Nausea: Secondary | ICD-10-CM | POA: Diagnosis not present

## 2023-05-22 DIAGNOSIS — Z885 Allergy status to narcotic agent status: Secondary | ICD-10-CM | POA: Diagnosis not present

## 2023-05-22 DIAGNOSIS — F17210 Nicotine dependence, cigarettes, uncomplicated: Secondary | ICD-10-CM | POA: Diagnosis not present

## 2023-06-08 DIAGNOSIS — J449 Chronic obstructive pulmonary disease, unspecified: Secondary | ICD-10-CM | POA: Diagnosis not present

## 2023-06-08 DIAGNOSIS — G9389 Other specified disorders of brain: Secondary | ICD-10-CM | POA: Diagnosis not present

## 2023-06-08 DIAGNOSIS — F17210 Nicotine dependence, cigarettes, uncomplicated: Secondary | ICD-10-CM | POA: Diagnosis not present

## 2023-06-08 DIAGNOSIS — R519 Headache, unspecified: Secondary | ICD-10-CM | POA: Diagnosis not present

## 2023-06-26 ENCOUNTER — Ambulatory Visit (INDEPENDENT_AMBULATORY_CARE_PROVIDER_SITE_OTHER): Admitting: Family Medicine

## 2023-06-26 ENCOUNTER — Encounter: Payer: Self-pay | Admitting: Family Medicine

## 2023-06-26 VITALS — BP 127/82 | HR 56 | Temp 98.1°F | Resp 16 | Wt 183.0 lb

## 2023-06-26 DIAGNOSIS — G43E19 Chronic migraine with aura, intractable, without status migrainosus: Secondary | ICD-10-CM | POA: Insufficient documentation

## 2023-06-26 DIAGNOSIS — L219 Seborrheic dermatitis, unspecified: Secondary | ICD-10-CM

## 2023-06-26 DIAGNOSIS — E236 Other disorders of pituitary gland: Secondary | ICD-10-CM

## 2023-06-26 MED ORDER — TOPIRAMATE 50 MG PO TABS
ORAL_TABLET | ORAL | 1 refills | Status: AC
Start: 2023-06-26 — End: ?

## 2023-06-26 MED ORDER — SUMATRIPTAN SUCCINATE 50 MG PO TABS: 50.0000 mg | ORAL_TABLET | Freq: Every day | ORAL | 5 refills | Status: AC | PRN

## 2023-06-26 MED ORDER — NEOMYCIN-POLYMYXIN-HC 3.5-10000-1 OT SOLN: 3.0000 [drp] | Freq: Three times a day (TID) | OTIC | 0 refills | Status: AC | PRN

## 2023-06-26 NOTE — Progress Notes (Signed)
 Established patient visit   Patient: Ann Morales   DOB: Feb 13, 1976   48 y.o. Female  MRN: [id number] Visit Date: 06/26/2023  Today's healthcare provider: Jeralene Mom, MD   Chief Complaint  Patient presents with   Migraine    Symptoms: Chronic migraines. Throbbing pain worsening in the last month. Patient has been to the ED    Subjective    Discussed the use of AI scribe software for clinical note transcription with the patient, who gave verbal consent to proceed.  History of Present Illness   Ann Morales is a 48 year old female with migraines and a pituitary adenoma who presents with increased frequency of migraines.  She has been experiencing daily migraines over the past week, leading to three emergency room visits in the last month. Her migraines have been a long-standing issue, beginning in her late twenties to early thirties. The current episodes are similar in nature to her previous migraines but have increased in frequency. The migraines affect entire head and are usually associated with numbness and tingling in right arm only.   She has a known pituitary adenoma, which was evaluated with an MRI over two years ago, showing no significant changes. A recent CT scan on March 31st also showed no changes. She was scheduled for surgical removal of the adenoma but was unable to proceed due to loss of Medicaid coverage.  For her migraines, she uses Imitrex  25 and 800 mg of ibuprofen , but Imitrex  is not effective. She also lies down in a dark room with a rag over her head to manage symptoms. She experiences occasional nausea and vomiting with the migraines, and reports right arm tingling and numbness during episodes, but no similar symptoms in her legs.  Her current medications include midodrine  for low blood pressure, taken as two tablets in the morning, two in the afternoon, and one at night, and Latuda for mood stabilization. No history of seizures.  She reports  itching in her ears, particularly the right ear, which has been persistent and bothersome.      Medications: Outpatient Medications Prior to Visit  Medication Sig   amphetamine-dextroamphetamine (ADDERALL) 15 MG tablet Take 1 tablet by mouth daily.   clonazePAM  (KLONOPIN ) 0.5 MG tablet Take 0.5 mg by mouth daily.   lithium  carbonate 300 MG capsule Take 300 mg by mouth 2 (two) times daily.   lurasidone (LATUDA) 40 MG TABS tablet Take 40 mg by mouth daily.   midodrine  (PROAMATINE ) 5 MG tablet Take 2 tablets (10 mg total) by mouth 2 (two) times daily with a meal AND 1 tablet (5 mg total) at bedtime. Take 2 tablets (10 mg total) by mouth 2 (two) times daily with a meal AND 1 tablet (5 mg total) at bedtime..   naloxone (NARCAN) nasal spray 4 mg/0.1 mL SMARTSIG:Both Nares   traZODone  (DESYREL ) 100 MG tablet Take 1-2 tablets 45 minutes prior to sleep for insomnia.   [DISCONTINUED] SUMAtriptan  (IMITREX ) 25 MG tablet Take 1 tablet (25 mg total) by mouth every 2 (two) hours as needed for migraine. May repeat in 2 hours if headache persists or recurs.   cyclobenzaprine  (FLEXERIL ) 10 MG tablet Take 1 tablet (10 mg total) by mouth 3 (three) times daily as needed for muscle spasms.   predniSONE  (DELTASONE ) 10 MG tablet Day 1 & 2 take 6 tablets Day 3 &4 take 5 tablets Day 5 &6 take 4 tablets Day 7 & 8 take 3 tablets Day 9 & 10 take  2 tablets Day 11 & 12 take 1 tablet Day 13 & 14 take 1/2 tablet   No facility-administered medications prior to visit.   Review of Systems     Objective    BP 127/82 (BP Location: Left Arm, Patient Position: Sitting, Cuff Size: Normal)   Pulse (!) 56   Temp 98.1 F (36.7 C) (Oral)   Resp 16   Wt 183 lb (83 kg)   SpO2 100%   BMI 30.45 kg/m   Physical Exam   General appearance: Well developed, well nourished female, cooperative and in no acute distress Head: Normocephalic, without obvious abnormality, atraumatic Respiratory: Respirations even and unlabored, normal  respiratory rate Extremities: All extremities are intact.  Skin: Sebrorhea of both ears.  Psych: Appropriate mood and affect. Neurologic: Mental status: Alert, oriented to person, place, and time, thought content appropriate.     Assessment & Plan       Chronic Migraines Migraines have increased to daily occurrences with ineffective current treatment. CT scan showed no changes in pituitary adenoma, which may contribute to headaches. Neurology referral recommended. Topiramate  suggested for prophylaxis. Increasing Imitrex  dose may provide relief. - Refer to neurologist Dr. Mason Sole at [facility]. - Prescribe topiramate  for migraine prophylaxis, starting with half a tablet once a day and gradually increasing to 50 mg twice a day. - Increase Imitrex  dose to 50 mg at headache onset. - Refill Imitrex  prescription.  Pituitary Adenoma Known pituitary adenoma shows no changes on recent CT scan. May be related to increased migraine frequency. - Monitor pituitary adenoma with neurologist follow-up.  Seborrheic Dermatitis of the Ear Canals Suspected seborrheic dermatitis causing ear canal inflammation. - Prescribe ear drops for treatment.  Hypotension On midodrine  for hypotension.  Mood Disorder On Latuda for mood stabilization.  Follow-up Referred to Dr. Mason Sole for migraines and pituitary adenoma evaluation. - Send prescriptions for topiramate , Imitrex  50 mg, and cortisporin drops    No follow-ups on file.      Jeralene Mom, MD  [facility] [phone] (phone) [phone] (fax)  [facility]

## 2023-07-09 DIAGNOSIS — F900 Attention-deficit hyperactivity disorder, predominantly inattentive type: Secondary | ICD-10-CM | POA: Diagnosis not present

## 2023-07-09 DIAGNOSIS — F3160 Bipolar disorder, current episode mixed, unspecified: Secondary | ICD-10-CM | POA: Diagnosis not present

## 2023-07-09 DIAGNOSIS — F411 Generalized anxiety disorder: Secondary | ICD-10-CM | POA: Diagnosis not present

## 2023-07-09 DIAGNOSIS — F4310 Post-traumatic stress disorder, unspecified: Secondary | ICD-10-CM | POA: Diagnosis not present

## 2023-07-13 ENCOUNTER — Encounter: Payer: Self-pay | Admitting: *Deleted

## 2023-07-13 ENCOUNTER — Other Ambulatory Visit: Payer: Self-pay

## 2023-07-13 ENCOUNTER — Emergency Department
Admission: EM | Admit: 2023-07-13 | Discharge: 2023-07-13 | Disposition: A | Discharge status: Home / Self Care | Attending: Emergency Medicine | Admitting: Emergency Medicine

## 2023-07-13 ENCOUNTER — Ambulatory Visit: Payer: Self-pay

## 2023-07-13 ENCOUNTER — Emergency Department

## 2023-07-13 DIAGNOSIS — R0789 Other chest pain: Secondary | ICD-10-CM | POA: Insufficient documentation

## 2023-07-13 DIAGNOSIS — J45909 Unspecified asthma, uncomplicated: Secondary | ICD-10-CM | POA: Diagnosis not present

## 2023-07-13 DIAGNOSIS — R072 Precordial pain: Secondary | ICD-10-CM | POA: Diagnosis not present

## 2023-07-13 DIAGNOSIS — R079 Chest pain, unspecified: Secondary | ICD-10-CM | POA: Diagnosis not present

## 2023-07-13 LAB — BASIC METABOLIC PANEL WITH GFR
Anion gap: 8 (ref 5–15)
BUN: 14 mg/dL (ref 6–20)
CO2: 27 mmol/L (ref 22–32)
Calcium: 9.4 mg/dL (ref 8.9–10.3)
Chloride: 98 mmol/L (ref 98–111)
Creatinine, Ser: 0.9 mg/dL (ref 0.44–1.00)
GFR, Estimated: >60 mL/min (ref >60)
Glucose, Bld: 96 mg/dL (ref 70–99)
Potassium: 4 mmol/L (ref 3.5–5.1)
Sodium: 133 mmol/L — ABNORMAL LOW (ref 135–145)

## 2023-07-13 LAB — CBC
HCT: 41.5 % (ref 36.0–46.0)
Hemoglobin: 13.4 g/dL (ref 12.0–15.0)
MCH: 28.2 pg (ref 26.0–34.0)
MCHC: 32.3 g/dL (ref 30.0–36.0)
MCV: 87.4 fL (ref 80.0–100.0)
Platelets: 220 10*3/uL (ref 150–400)
RBC: 4.75 MIL/uL (ref 3.87–5.11)
RDW: 12.7 % (ref 11.5–15.5)
WBC: 13 10*3/uL — ABNORMAL HIGH (ref 4.0–10.5)
nRBC: 0 % (ref 0.0–0.2)

## 2023-07-13 LAB — TROPONIN I (HIGH SENSITIVITY)
Troponin I (High Sensitivity): 4 ng/L (ref <18)
Troponin I (High Sensitivity): 3 ng/L (ref <18)

## 2023-07-13 LAB — D-DIMER, QUANTITATIVE: D-Dimer, Quant: <0.27 ug{FEU}/mL (ref 0.00–0.50)

## 2023-07-13 MED ORDER — KETOROLAC TROMETHAMINE 30 MG/ML IJ SOLN
15.0000 mg | Freq: Once | INTRAMUSCULAR | Status: AC
  Administered 2023-07-13: 15 mg via INTRAMUSCULAR
  Filled 2023-07-13: qty 1

## 2023-07-13 MED ORDER — LIDOCAINE 5 % EX PTCH
1.0000 | MEDICATED_PATCH | CUTANEOUS | Status: DC
  Administered 2023-07-13: 1 via TRANSDERMAL
  Filled 2023-07-13: qty 1

## 2023-07-13 MED ORDER — LIDOCAINE 5 % EX PTCH: 1.0000 | MEDICATED_PATCH | CUTANEOUS | 0 refills | Status: AC

## 2023-07-13 MED ORDER — ACETAMINOPHEN 500 MG PO TABS
1000.0000 mg | ORAL_TABLET | Freq: Once | ORAL | Status: AC
  Administered 2023-07-13: 1000 mg via ORAL
  Filled 2023-07-13: qty 2

## 2023-07-13 NOTE — ED Triage Notes (Signed)
 Pt ambulatory to triage.  Pt reports chest pain for 2 days.  States pain worse today.  Pt also reports pain when taking a deep breath.  Cig smoker/vape  pt alert.

## 2023-07-13 NOTE — Discharge Instructions (Signed)
 You can take ibuprofen  or Tylenol  every 6 hours as needed for pain.  I have also prescribed you some Lidoderm  patches.

## 2023-07-13 NOTE — ED Provider Notes (Signed)
 [facility] Provider Note    Event Date/Time   First MD Initiated Contact with Patient 07/13/23 1844     (approximate)   History   Chest Pain   HPI  Ann Morales is a 48 y.o. female with history of bipolar disorder, ADHD, anxiety, asthma, GERD, presenting with midsternal chest pain that radiates to her right chest and ongoing for 2 weeks.  Patient states constant, reproducible, worse when she tries to take a deep breath.  No shortness of breath or cough, no fevers or leg swelling.  She denies any prior history of cancer, no prior history of DVTs, no trauma or falls, no recent travel or surgeries, no hormone use, no hemoptysis.  States that she took some ibuprofen  for pain without any improvement.  No headaches, weakness, numbness, vision changes.  Does not feel like her acid reflux symptoms.  On independent chart review, she was seen by her primary care doctor on 18 April, has history of chronic migraines, pituitary adenoma, history of mood disorder on Latuda, no reported chest pain at that time.     Physical Exam   Triage Vital Signs: ED Triage Vitals  Encounter Vitals Group     BP 07/13/23 1526 117/81     Systolic BP Percentile --      Diastolic BP Percentile --      Pulse Rate 07/13/23 1526 82     Resp 07/13/23 1526 18     Temp 07/13/23 1526 98.3 F (36.8 C)     Temp Source 07/13/23 1526 Oral     SpO2 07/13/23 1526 100 %     Weight 07/13/23 1527 183 lb (83 kg)     Height 07/13/23 1527 5\' 5"  (1.651 m)     Head Circumference --      Peak Flow --      Pain Score 07/13/23 1527 10     Pain Loc --      Pain Education --      Exclude from Growth Chart --     Most recent vital signs: Vitals:   07/13/23 1526  BP: 117/81  Pulse: 82  Resp: 18  Temp: 98.3 F (36.8 C)  SpO2: 100%     General: Awake, no distress.  CV:  Good peripheral perfusion.  Right anterior thoracic cage tenderness Resp:  Normal effort.  Clear Abd:  No distention.   Soft nontender Other:  No unilateral calf swelling or tenderness   ED Results / Procedures / Treatments   Labs (all labs ordered are listed, but only abnormal results are displayed) Labs Reviewed  BASIC METABOLIC PANEL WITH GFR - Abnormal; Notable for the following components:      Result Value   Sodium 133 (*)    All other components within normal limits  CBC - Abnormal; Notable for the following components:   WBC 13.0 (*)    All other components within normal limits  D-DIMER, QUANTITATIVE  TROPONIN I (HIGH SENSITIVITY)  TROPONIN I (HIGH SENSITIVITY)     EKG  EKG shows, sinus rhythm, rate 77, normal QRS, normal QTc, no ischemic ST elevation, T wave flattening in inferior leads, T wave inversion to V3, T wave flattening to V4, V5, no ischemic ST elevation, T wave inversions new compared to prior   RADIOLOGY On my independent interpretation, chest x-ray without obvious consolidation   PROCEDURES:  Critical Care performed: No  Procedures   MEDICATIONS ORDERED IN ED: Medications  lidocaine  (LIDODERM ) 5 %  1 patch (1 patch Transdermal Patch Applied 07/13/23 1921)  acetaminophen  (TYLENOL ) tablet 1,000 mg (1,000 mg Oral Given 07/13/23 1921)  ketorolac  (TORADOL ) 30 MG/ML injection 15 mg (15 mg Intramuscular Given 07/13/23 1921)     IMPRESSION / MDM / ASSESSMENT AND PLAN / ED COURSE  I reviewed the triage vital signs and the nursing notes.                              Differential diagnosis includes, but is not limited to, costochondritis, musculoskeletal pain, ACS, also considered PE but patient is not hypoxic, not tachycardic, will get a D-dimer, labs, EKG, troponin, chest x-ray.  Will give her some Toradol  here, Tylenol , Lidoderm  patches.  Patient's presentation is most consistent with acute presentation with potential threat to life or bodily function.  Independent review of labs and imaging below.  Considered but no indication for inpatient admission at this time, patient  is safe for outpatient management.  Will discharge to return precautions.  Suspect this might be costochondritis or musculoskeletal pain given the reproducible nature of her chest wall pain.  Would cover Lidoderm  patches for home, Tylenol  or ibuprofen  as needed for pain.  Instructed her to follow-up with her primary care doctor for further management of her symptoms.  Strict and precautions given.  Discharged.  The patient is on the cardiac monitor to evaluate for evidence of arrhythmia and/or significant heart rate changes.   Clinical Course as of 07/13/23 2045  Mon Jul 13, 2023  1914 Independent review of labs, electrolytes not severely deranged, troponins negative, mild leukocytosis but she has no infectious symptoms. [TT]  1854 DG Chest 2 View No active cardiopulmonary disease.  [TT]  2015 Troponin x 2 is negative. [TT]  2045 D-dimer, quantitative Not elevated. [TT]    Clinical Course User Index [TT] Drenda Gentle Richard Champion, MD     FINAL CLINICAL IMPRESSION(S) / ED DIAGNOSES   Final diagnoses:  Chest wall pain     Rx / DC Orders   ED Discharge Orders          Ordered    lidocaine  (LIDODERM ) 5 %  Every 24 hours        07/13/23 2045             Note:  This document was prepared using Dragon voice recognition software and may include unintentional dictation errors.    Shane Darling, MD 07/13/23 [phone]

## 2023-07-13 NOTE — Telephone Encounter (Signed)
 Copied from CRM [id number]. Topic: Clinical - Red Word Triage >> Jul 13, 2023 11:41 AM Ann Morales T wrote: Red Word that prompted transfer to Nurse Triage: patient stated she is having chest pain. She says it hurts to take a deep breath  Chief Complaint: chest pain 10/10 Symptoms: pain in chest Frequency: constant Pertinent Negatives: Patient denies NA Disposition: [x] ED /[] Urgent Care (no appt availability in office) / [] Appointment(In office/virtual)/ []  [facility]/ [] Home Care/ [] Refused Recommended Disposition /[] [place] [facility]/ []  Follow-up with PCP Additional Notes: instructed to go to ER.  Pcp office updated.   Reason for Disposition  SEVERE chest pain  Answer Assessment - Initial Assessment Questions 1. LOCATION: "Where does it hurt?"       middle 2. RADIATION: "Does the pain go anywhere else?" (e.g., into neck, jaw, arms, back)     denies 3. ONSET: "When did the chest pain begin?" (Minutes, hours or days)      Couple of days ago 4. PATTERN: "Does the pain come and go, or has it been constant since it started?"  "Does it get worse with exertion?"      constant 5. DURATION: "How long does it last" (e.g., seconds, minutes, hours)     All the time 6. SEVERITY: "How bad is the pain?"  (e.g., Scale 1-10; mild, moderate, or severe)    - MILD (1-3): doesn't interfere with normal activities     - MODERATE (4-7): interferes with normal activities or awakens from sleep    - SEVERE (8-10): excruciating pain, unable to do any normal activities       10/10 7. CARDIAC RISK FACTORS: "Do you have any history of heart problems or risk factors for heart disease?" (e.g., angina, prior heart attack; diabetes, high blood pressure, high cholesterol, smoker, or strong family history of heart disease)     Smoker and has had this before 8. PULMONARY RISK FACTORS: "Do you have any history of lung disease?"  (e.g., blood clots in lung, asthma, emphysema, birth control pills)      asthma 9. CAUSE: "What do you think is causing the chest pain?"     unknown 10. OTHER SYMPTOMS: "Do you have any other symptoms?" (e.g., dizziness, nausea, vomiting, sweating, fever, difficulty breathing, cough)       Sob, hurts to take a deep breath 11. PREGNANCY: "Is there any chance you are pregnant?" "When was your last menstrual period?"       na  Protocols used: Chest Pain-A-AH

## 2023-07-13 NOTE — ED Notes (Signed)
 See triage notes. Patient c/o chest pain for the past two days with today being worse. Patient reports pain when taking a deep breath.

## 2023-07-15 ENCOUNTER — Ambulatory Visit: Payer: Self-pay

## 2023-07-15 NOTE — Telephone Encounter (Signed)
 Copied from CRM [id number]. Topic: Clinical - Red Word Triage >> Jul 13, 2023 11:41 AM Ann Morales T wrote: Red Word that prompted transfer to Nurse Triage: patient stated she is having chest pain. She says it hurts to take a deep breath >> Jul 15, 2023 10:03 AM [name] wrote: Pt called back and states the chest pain is continues and when she breathes in deep she can't breath very painful Patient believes she has had this once before Inflammation or an infection around her lungs, EKG came back with no finding but she was still feeling the same pains and now its worse    Chief Complaint: Chest pain Symptoms: Chest pain Frequency: Constant  Pertinent Negatives: Patient denies any other symptoms at this time  Disposition: [] ED /[] Urgent Care (no appt availability in office) / [x] Appointment(In office/virtual)/ []  [facility]/ [] Home Care/ [] Refused Recommended Disposition /[] [facility]/ []  Follow-up with PCP Additional Notes: Patient seen in the ED 2 days ago for chest pain. Patient states she was discharged but is still experiencing chest pain. She states her pain feels the same but is worse, and states that taking a deep breath exacerbates her pain. She denies any other symptoms at this time. Appointment made for the patient on Friday. Patient instructed to call back for new or worsening symptoms. Patient verbalized understanding and agreement with this plan.     Reason for Disposition  Taking a deep breath makes pain worse    Patient seen in the ED, appointment in office scheduled  Answer Assessment - Initial Assessment Questions 1. LOCATION: "Where does it hurt?"       Mid-chest and right sided chest  2. RADIATION: "Does the pain go anywhere else?" (e.g., into neck, jaw, arms, back)     No 3. ONSET: "When did the chest pain begin?" (Minutes, hours or days)      1 week 4. PATTERN: "Does the pain come and go, or has it been constant since it started?"  "Does it get worse  with exertion?"      Constant  5. DURATION: "How long does it last" (e.g., seconds, minutes, hours)     Constant  6. SEVERITY: "How bad is the pain?"  (e.g., Scale 1-10; mild, moderate, or severe)    - MILD (1-3): doesn't interfere with normal activities     - MODERATE (4-7): interferes with normal activities or awakens from sleep    - SEVERE (8-10): excruciating pain, unable to do any normal activities       10/10 7. CARDIAC RISK FACTORS: "Do you have any history of heart problems or risk factors for heart disease?" (e.g., angina, prior heart attack; diabetes, high blood pressure, high cholesterol, smoker, or strong family history of heart disease)     No 8. PULMONARY RISK FACTORS: "Do you have any history of lung disease?"  (e.g., blood clots in lung, asthma, emphysema, birth control pills)     No 9. CAUSE: "What do you think is causing the chest pain?"     Had similar symptoms with previous lung infection  10. OTHER SYMPTOMS: "Do you have any other symptoms?" (e.g., dizziness, nausea, vomiting, sweating, fever, difficulty breathing, cough)       No  Protocols used: Chest Pain-A-AH

## 2023-07-17 ENCOUNTER — Encounter: Payer: Self-pay | Admitting: Physician Assistant

## 2023-07-17 ENCOUNTER — Ambulatory Visit (INDEPENDENT_AMBULATORY_CARE_PROVIDER_SITE_OTHER): Admitting: Physician Assistant

## 2023-07-17 VITALS — BP 96/73 | HR 71 | Ht 65.0 in | Wt 179.4 lb

## 2023-07-17 DIAGNOSIS — J3089 Other allergic rhinitis: Secondary | ICD-10-CM | POA: Diagnosis not present

## 2023-07-17 DIAGNOSIS — R0789 Other chest pain: Secondary | ICD-10-CM | POA: Diagnosis not present

## 2023-07-17 NOTE — Progress Notes (Unsigned)
 Established patient visit  Patient: Ann Morales   DOB: 06-10-1975   48 y.o. Female  MRN: [id number] Visit Date: 07/17/2023  Today's healthcare provider: Blane Bunting, PA-C   Chief Complaint  Patient presents with   Follow-up    Patient was seen in ED on 07/13/23 for chest wall pain. She reports chest pains present for 1 week and are now worsening since discharge. EKG, DG 2 Chest View, and lab work completed at patient ED visit. Pt was provided lidocaine  5% patch. Pt reports she has not used patch since leaving ED as insurance would not pay for them.    Subjective      Discussed the use of AI scribe software for clinical note transcription with the patient, who gave verbal consent to proceed.  History of Present Illness Ann Morales is a 48 year old female with a history of inflammation around the lungs who presents with chest pain.  She experiences sharp, constant chest pain radiating to the right breast, similar to a previous episode of pleuritis. The pain has persisted for four days and is unaffected by changes in position or activity. Deep breaths cause discomfort and sometimes difficulty in breathing deeply. There is no shortness of breath, palpitations, or visual disturbances. A previously prescribed pain patch has not alleviated the pain. Her blood pressure is typically low.  She works as an Engineer, maintenance indoors, with a non-stressful and non-physically demanding job. She exercises regularly. She reports nasal congestion and post-nasal drainage. There is no fever, gastrointestinal symptoms, or abdominal pain.       06/26/2023   10:45 AM 01/01/2023    3:22 PM 12/23/2022    1:25 PM  Depression screen PHQ 2/9  Decreased Interest 0 3 3  Down, Depressed, Hopeless 0 3 3  PHQ - 2 Score 0 6 6  Altered sleeping  0 0  Tired, decreased energy  3 3  Change in appetite  3 3  Feeling bad or failure about yourself   3 3  Trouble concentrating  3 3  Moving slowly or  fidgety/restless  2 2  Suicidal thoughts  1 1  PHQ-9 Score  21 21  Difficult doing work/chores  Very difficult       06/26/2023   10:45 AM 01/01/2023    3:23 PM 12/23/2022    1:25 PM 12/18/2022    3:49 PM  GAD 7 : Generalized Anxiety Score  Nervous, Anxious, on Edge 0 3 3 3   Control/stop worrying 3 3 3 3   Worry too much - different things 3 3 3 3   Trouble relaxing 0 0 0 1  Restless 0 0  1  Easily annoyed or irritable 3 0  3  Afraid - awful might happen 3 0  3  Total GAD 7 Score 12 9  17   Anxiety Difficulty Somewhat difficult Somewhat difficult  Very difficult    Medications: Outpatient Medications Prior to Visit  Medication Sig   amphetamine-dextroamphetamine (ADDERALL) 15 MG tablet Take 1 tablet by mouth daily.   clonazePAM  (KLONOPIN ) 0.5 MG tablet Take 0.5 mg by mouth daily.   lithium  carbonate 300 MG capsule Take 300 mg by mouth 2 (two) times daily.   lurasidone (LATUDA) 40 MG TABS tablet Take 40 mg by mouth daily. Taking 2 tablets daily   midodrine  (PROAMATINE ) 5 MG tablet Take 2 tablets (10 mg total) by mouth 2 (two) times daily with a meal AND 1 tablet (5 mg total) at bedtime. Take 2 tablets (  10 mg total) by mouth 2 (two) times daily with a meal AND 1 tablet (5 mg total) at bedtime..   naloxone (NARCAN) nasal spray 4 mg/0.1 mL SMARTSIG:Both Nares   neomycin -polymyxin-hydrocortisone (CORTISPORIN) OTIC solution Place 3 drops into both ears 3 (three) times daily as needed.   SUMAtriptan  (IMITREX ) 50 MG tablet Take 1 tablet (50 mg total) by mouth daily as needed for migraine. May repeat in 2 hours if headache persists or recurs.   topiramate  (TOPAMAX ) 50 MG tablet 1/2 tablet every evening for 7 days, then 1/2 tablet twice a day for 7 days, then 1/2 in the morning and 1 at night for 7 days, then 1 tablet twice a day   traZODone  (DESYREL ) 100 MG tablet Take 1-2 tablets 45 minutes prior to sleep for insomnia.   cyclobenzaprine  (FLEXERIL ) 10 MG tablet Take 1 tablet (10 mg total) by  mouth 3 (three) times daily as needed for muscle spasms. (Patient not taking: Reported on 07/17/2023)   lidocaine  (LIDODERM ) 5 % Place 1 patch onto the skin daily for 10 days. Remove & Discard patch within 12 hours or as directed by MD (Patient not taking: Reported on 07/17/2023)   [DISCONTINUED] predniSONE  (DELTASONE ) 10 MG tablet Day 1 & 2 take 6 tablets Day 3 &4 take 5 tablets Day 5 &6 take 4 tablets Day 7 & 8 take 3 tablets Day 9 & 10 take 2 tablets Day 11 & 12 take 1 tablet Day 13 & 14 take 1/2 tablet   No facility-administered medications prior to visit.    Review of Systems All negative Except see HPI   {Insert previous labs (optional):23779} {See past labs  Heme  Chem  Endocrine  Serology  Results Review (optional):1}   Objective    BP 96/73 (BP Location: Right Arm, Patient Position: Sitting, Cuff Size: Normal)   Pulse 71   Ht 5\' 5"  (1.651 m)   Wt 179 lb 6.4 oz (81.4 kg)   SpO2 98%   BMI 29.85 kg/m  {Insert last BP/Wt (optional):23777}{See vitals history (optional):1}   Physical Exam Vitals reviewed.  Constitutional:      General: She is not in acute distress.    Appearance: Normal appearance. She is well-developed. She is not diaphoretic.  HENT:     Head: Normocephalic and atraumatic.  Eyes:     General: No scleral icterus.    Conjunctiva/sclera: Conjunctivae normal.  Neck:     Thyroid: No thyromegaly.  Cardiovascular:     Rate and Rhythm: Normal rate and regular rhythm.     Pulses: Normal pulses.     Heart sounds: Normal heart sounds. No murmur heard. Pulmonary:     Effort: Pulmonary effort is normal. No respiratory distress.     Breath sounds: Normal breath sounds. No wheezing, rhonchi or rales.  Musculoskeletal:     Cervical back: Neck supple.     Right lower leg: No edema.     Left lower leg: No edema.  Lymphadenopathy:     Cervical: No cervical adenopathy.  Skin:    General: Skin is warm and dry.     Findings: No rash.  Neurological:     Mental  Status: She is alert and oriented to person, place, and time. Mental status is at baseline.  Psychiatric:        Mood and Affect: Mood normal.        Behavior: Behavior normal.      No results found for any visits on 07/17/23.  Assessment & Plan Chest pain Intermittent chest pain for four days, radiating to the right breast. Previous pleuritis. Normal vitals and imaging. Differential includes musculoskeletal pain or costochondritis or pleuritis. Pain is reproducible. Pain constant, sharp, unrelieved by prior management. Discussed topical analgesics and oral medications. - Recommend topical analgesics: Icy Hot, Biofreeze, Voltaren gel. - Advise daily acetaminophen  to increase pain threshold. - Suggest ibuprofen  for pain relief and inflammation reduction. - Encourage rest, hydration, healthy diet. - Instruct to monitor for new symptoms: weakness, dizziness, tingling, numbness, worsening chest pain; seek urgent care if these occur. - Schedule follow-up in 1-2 weeks if symptoms persist.  Musculoskeletal pain: Often localized and reproducible with palpation; managed with analgesics and physical therapy  Allergic rhinitis Nasal congestion and post-nasal drainage suggest allergic rhinitis. No cough or other respiratory symptoms. Discussed saline spray, Flonase , and antihistamines. - Recommend saline spray or Flonase  for nasal congestion. - Suggest antihistamines: loratadine  or cetirizine . Management will depend on the specific diagnosis but may include hydration, smoking cessation, pain management, anticoagulation, antibiotics, or other targeted therapies.  No orders of the defined types were placed in this encounter.   No follow-ups on file.   The patient was advised to call back or seek an in-person evaluation if the symptoms worsen or if the condition fails to improve as anticipated.  I discussed the assessment and treatment plan with the patient. The patient was provided an  opportunity to ask questions and all were answered. The patient agreed with the plan and demonstrated an understanding of the instructions.  I, Melea Prezioso, PA-C have reviewed all documentation for this visit. The documentation on 07/17/2023  for the exam, diagnosis, procedures, and orders are all accurate and complete.  Blane Bunting, [facility], MMS [facility] [phone] (phone) [phone] (fax)  [facility]

## 2023-07-19 DIAGNOSIS — R0789 Other chest pain: Secondary | ICD-10-CM | POA: Insufficient documentation

## 2023-07-19 DIAGNOSIS — J309 Allergic rhinitis, unspecified: Secondary | ICD-10-CM | POA: Insufficient documentation

## 2023-07-24 DIAGNOSIS — F900 Attention-deficit hyperactivity disorder, predominantly inattentive type: Secondary | ICD-10-CM | POA: Diagnosis not present

## 2023-07-29 DIAGNOSIS — M542 Cervicalgia: Secondary | ICD-10-CM | POA: Diagnosis not present

## 2023-07-29 DIAGNOSIS — M546 Pain in thoracic spine: Secondary | ICD-10-CM | POA: Diagnosis not present

## 2023-07-29 DIAGNOSIS — M47814 Spondylosis without myelopathy or radiculopathy, thoracic region: Secondary | ICD-10-CM | POA: Diagnosis not present

## 2023-07-29 DIAGNOSIS — M4722 Other spondylosis with radiculopathy, cervical region: Secondary | ICD-10-CM | POA: Diagnosis not present

## 2023-09-07 ENCOUNTER — Telehealth: Payer: Self-pay | Admitting: Family Medicine

## 2023-09-07 ENCOUNTER — Other Ambulatory Visit: Payer: Self-pay

## 2023-09-07 ENCOUNTER — Other Ambulatory Visit: Payer: Self-pay | Admitting: Family Medicine

## 2023-09-07 ENCOUNTER — Ambulatory Visit: Payer: Self-pay | Admitting: *Deleted

## 2023-09-07 DIAGNOSIS — M5416 Radiculopathy, lumbar region: Secondary | ICD-10-CM

## 2023-09-07 DIAGNOSIS — G903 Multi-system degeneration of the autonomic nervous system: Secondary | ICD-10-CM

## 2023-09-07 MED ORDER — MIDODRINE HCL 5 MG PO TABS: ORAL_TABLET | ORAL | 2 refills | Status: DC

## 2023-09-07 NOTE — Addendum Note (Signed)
 Addended by: Merelyn Klump D on: 09/07/2023 01:52 PM   Modules accepted: Orders

## 2023-09-07 NOTE — Telephone Encounter (Signed)
 LOV 07/17/23 NOV none LRF 02/04/23 150 x 2

## 2023-09-07 NOTE — Telephone Encounter (Signed)
 Error

## 2023-09-07 NOTE — Telephone Encounter (Signed)
 [facility] faxed refill request for the following medications:   midodrine  (PROAMATINE ) 5 MG tablet    Please advise.

## 2023-09-07 NOTE — Telephone Encounter (Signed)
 FYI Only or Action Required?: Action required by provider: medication refill request.  Patient was last seen in primary care on 06/26/2023 by Gasper Nancyann BRAVO, MD. Called Nurse Triage reporting Dizziness. Symptoms began several days ago. Interventions attempted: Other: Patient out of her Rx- requesting RF. Symptoms are: gradually worsening.  Triage Disposition: See Physician Within 24 Hours  Patient/caregiver understands and will follow disposition?: no   Reason for Disposition  Taking a medicine that could cause dizziness (e.g., blood pressure medications, diuretics)  Answer Assessment - Initial Assessment Questions Patient state she is out of refills on her midodrine  (PROAMATINE ) 5 MG tablet which she feels is causing her to have dizziness. Patient is at another appointment presently- would like to get refill if possible. Patient states she will make appointment if needed- but feels her symptoms are related to not having her medication.     1. DESCRIPTION: Describe your dizziness.     Patient has dizziness when she stands up 2. LIGHTHEADED: Do you feel lightheaded? (e.g., somewhat faint, woozy, weak upon standing)     Only once- yesterday felt faint  4. SEVERITY: How bad is it?  Do you feel like you are going to faint? Can you stand and walk?   - MILD: Feels slightly dizzy, but walking normally.   - MODERATE: Feels unsteady when walking, but not falling; interferes with normal activities (e.g., school, work).   - SEVERE: Unable to walk without falling, or requires assistance to walk without falling; feels like passing out now.      mild 5. ONSET:  When did the dizziness begin?     2 days 6. AGGRAVATING FACTORS: Does anything make it worse? (e.g., standing, change in head position)     Change in position- patient has been out of her medication 7. HEART RATE: Can you tell me your heart rate? How many beats in 15 seconds?  (Note: not all patients can do this)        151/56- yesterday  8. CAUSE: What do you think is causing the dizziness?     Out of medication  9. RECURRENT SYMPTOM: Have you had dizziness before? If Yes, ask: When was the last time? What happened that time?     Patient states this has happened before- without medication  10. OTHER SYMPTOMS: Do you have any other symptoms? (e.g., fever, chest pain, vomiting, diarrhea, bleeding)       no  Protocols used: Dizziness - Lightheadedness-A-AH    Copied from CRM [id number]. Topic: Clinical - Red Word Triage >> Sep 07, 2023 10:27 AM [place] MATSU wrote: Red Word that prompted transfer to Nurse Triage: dizzyness, lightheaded and low blood pressure.

## 2023-09-07 NOTE — Telephone Encounter (Signed)
 Another message request already send to provider

## 2023-09-24 ENCOUNTER — Ambulatory Visit: Admitting: Dermatology

## 2023-09-24 ENCOUNTER — Encounter: Payer: Self-pay | Admitting: Dermatology

## 2023-09-24 DIAGNOSIS — L409 Psoriasis, unspecified: Secondary | ICD-10-CM | POA: Diagnosis not present

## 2023-09-24 MED ORDER — CLOBETASOL PROPIONATE 0.05 % EX OINT: 1.0000 | TOPICAL_OINTMENT | CUTANEOUS | 4 refills | Status: AC

## 2023-09-24 NOTE — Patient Instructions (Signed)

## 2023-09-24 NOTE — Progress Notes (Signed)
   Follow-Up Visit   Subjective  Ann Morales is a 48 y.o. female who presents for the following: Psoriasis ears, elbows, yrs, otc creams, in past tried Otezla with no good response, topical steroids, Xtrac, pt thought Xtrac helped   The following portions of the chart were reviewed this encounter and updated as appropriate: medications, allergies, medical history  Review of Systems:  No other skin or systemic complaints except as noted in HPI or Assessment and Plan.  Objective  Well appearing patient in no apparent distress; mood and affect are within normal limits.   A focused examination was performed of the following areas: Ears, arms, scalp  Relevant exam findings are noted in the Assessment and Plan.    Assessment & Plan   PSORIASIS Elbows, ears Exam: Well-demarcated erythematous papules/plaques with silvery scale.  2% BSA.  Chronic and persistent condition with duration or expected duration over one year. Condition is bothersome/symptomatic for patient. Currently flared.   patient denies joint pain  Psoriasis is a chronic non-curable, but treatable genetic/hereditary disease that may have other systemic features affecting other organ systems such as joints (Psoriatic Arthritis). It is associated with an increased risk of inflammatory bowel disease, heart disease, non-alcoholic fatty liver disease, and depression.  Treatments include light and laser treatments; topical medications; and systemic medications including oral and injectables.  Treatment Plan: Discussed treatment options biologics vs topical creams, pt prefers topicals Start Clobetasol  oint qd aa ears, elbows until clear, then prn flares, avoid f/g/a  Topical steroids (such as triamcinolone , fluocinolone, fluocinonide, mometasone, clobetasol , halobetasol, betamethasone, hydrocortisone) can cause thinning and lightening of the skin if they are used for too long in the same area. Your physician has  selected the right strength medicine for your problem and area affected on the body. Please use your medication only as directed by your physician to prevent side effects.   PSORIASIS    Return in about 3 months (around 12/25/2023) for Psoriasis f/u.  I, Grayce Saunas, RMA, am acting as scribe for Boneta Sharps, MD .   Documentation: I have reviewed the above documentation for accuracy and completeness, and I agree with the above.  Boneta Sharps, MD

## 2023-09-28 DIAGNOSIS — I959 Hypotension, unspecified: Secondary | ICD-10-CM | POA: Diagnosis not present

## 2023-09-28 DIAGNOSIS — F419 Anxiety disorder, unspecified: Secondary | ICD-10-CM | POA: Diagnosis not present

## 2023-09-28 DIAGNOSIS — G43909 Migraine, unspecified, not intractable, without status migrainosus: Secondary | ICD-10-CM | POA: Diagnosis not present

## 2023-09-28 DIAGNOSIS — G43809 Other migraine, not intractable, without status migrainosus: Secondary | ICD-10-CM | POA: Diagnosis not present

## 2023-09-28 DIAGNOSIS — R112 Nausea with vomiting, unspecified: Secondary | ICD-10-CM | POA: Diagnosis not present

## 2023-09-28 DIAGNOSIS — Z882 Allergy status to sulfonamides status: Secondary | ICD-10-CM | POA: Diagnosis not present

## 2023-09-28 DIAGNOSIS — Z881 Allergy status to other antibiotic agents status: Secondary | ICD-10-CM | POA: Diagnosis not present

## 2023-09-28 DIAGNOSIS — R42 Dizziness and giddiness: Secondary | ICD-10-CM | POA: Diagnosis not present

## 2023-09-28 DIAGNOSIS — Z885 Allergy status to narcotic agent status: Secondary | ICD-10-CM | POA: Diagnosis not present

## 2023-09-28 DIAGNOSIS — F17210 Nicotine dependence, cigarettes, uncomplicated: Secondary | ICD-10-CM | POA: Diagnosis not present

## 2023-09-28 DIAGNOSIS — J449 Chronic obstructive pulmonary disease, unspecified: Secondary | ICD-10-CM | POA: Diagnosis not present

## 2023-10-13 DIAGNOSIS — Z882 Allergy status to sulfonamides status: Secondary | ICD-10-CM | POA: Diagnosis not present

## 2023-10-13 DIAGNOSIS — F17210 Nicotine dependence, cigarettes, uncomplicated: Secondary | ICD-10-CM | POA: Diagnosis not present

## 2023-10-13 DIAGNOSIS — J449 Chronic obstructive pulmonary disease, unspecified: Secondary | ICD-10-CM | POA: Diagnosis not present

## 2023-10-13 DIAGNOSIS — G43909 Migraine, unspecified, not intractable, without status migrainosus: Secondary | ICD-10-CM | POA: Diagnosis not present

## 2023-10-13 DIAGNOSIS — G43809 Other migraine, not intractable, without status migrainosus: Secondary | ICD-10-CM | POA: Diagnosis not present

## 2023-10-13 DIAGNOSIS — G9389 Other specified disorders of brain: Secondary | ICD-10-CM | POA: Diagnosis not present

## 2023-10-13 DIAGNOSIS — R519 Headache, unspecified: Secondary | ICD-10-CM | POA: Diagnosis not present

## 2023-10-13 DIAGNOSIS — Z881 Allergy status to other antibiotic agents status: Secondary | ICD-10-CM | POA: Diagnosis not present

## 2023-10-26 ENCOUNTER — Telehealth: Payer: Self-pay

## 2023-10-26 NOTE — Telephone Encounter (Signed)
 Please advise on pt request. Pt last ov 07/17/23 not for bowel. Does pt need an appt?

## 2023-10-26 NOTE — Telephone Encounter (Signed)
 Needs ov

## 2023-10-26 NOTE — Telephone Encounter (Signed)
 Copied from CRM #[id number]. Topic: Clinical - Medication Question >> Oct 26, 2023  9:46 AM Roselie BROCKS wrote: Reason for CRM: Patient needs someone to return a call to her, she is requesting something sent in to pharmacy to help with bile movement, said she's unable to pass bile.  And patient stated it is ok to leave a detailed message on her voicemail.

## 2023-10-27 DIAGNOSIS — D497 Neoplasm of unspecified behavior of endocrine glands and other parts of nervous system: Secondary | ICD-10-CM | POA: Diagnosis not present

## 2023-10-27 DIAGNOSIS — Z8679 Personal history of other diseases of the circulatory system: Secondary | ICD-10-CM | POA: Diagnosis not present

## 2023-10-27 DIAGNOSIS — F99 Mental disorder, not otherwise specified: Secondary | ICD-10-CM | POA: Diagnosis not present

## 2023-10-27 DIAGNOSIS — G43E09 Chronic migraine with aura, not intractable, without status migrainosus: Secondary | ICD-10-CM | POA: Diagnosis not present

## 2023-11-03 DIAGNOSIS — M546 Pain in thoracic spine: Secondary | ICD-10-CM | POA: Diagnosis not present

## 2023-11-03 DIAGNOSIS — M791 Myalgia, unspecified site: Secondary | ICD-10-CM | POA: Diagnosis not present

## 2023-11-03 DIAGNOSIS — G43E09 Chronic migraine with aura, not intractable, without status migrainosus: Secondary | ICD-10-CM | POA: Diagnosis not present

## 2023-11-03 DIAGNOSIS — G8929 Other chronic pain: Secondary | ICD-10-CM | POA: Diagnosis not present

## 2023-11-03 DIAGNOSIS — Z8679 Personal history of other diseases of the circulatory system: Secondary | ICD-10-CM | POA: Diagnosis not present

## 2023-11-03 DIAGNOSIS — Z981 Arthrodesis status: Secondary | ICD-10-CM | POA: Diagnosis not present

## 2023-11-03 DIAGNOSIS — M542 Cervicalgia: Secondary | ICD-10-CM | POA: Diagnosis not present

## 2023-11-05 DIAGNOSIS — Z720 Tobacco use: Secondary | ICD-10-CM | POA: Diagnosis not present

## 2023-11-05 DIAGNOSIS — D352 Benign neoplasm of pituitary gland: Secondary | ICD-10-CM | POA: Diagnosis not present

## 2023-11-10 DIAGNOSIS — R110 Nausea: Secondary | ICD-10-CM | POA: Diagnosis not present

## 2023-11-10 DIAGNOSIS — F17210 Nicotine dependence, cigarettes, uncomplicated: Secondary | ICD-10-CM | POA: Diagnosis not present

## 2023-11-10 DIAGNOSIS — F32A Depression, unspecified: Secondary | ICD-10-CM | POA: Diagnosis not present

## 2023-11-10 DIAGNOSIS — R519 Headache, unspecified: Secondary | ICD-10-CM | POA: Diagnosis not present

## 2023-11-10 DIAGNOSIS — R112 Nausea with vomiting, unspecified: Secondary | ICD-10-CM | POA: Diagnosis not present

## 2023-11-10 DIAGNOSIS — F419 Anxiety disorder, unspecified: Secondary | ICD-10-CM | POA: Diagnosis not present

## 2023-11-10 DIAGNOSIS — J449 Chronic obstructive pulmonary disease, unspecified: Secondary | ICD-10-CM | POA: Diagnosis not present

## 2023-11-14 DIAGNOSIS — M4804 Spinal stenosis, thoracic region: Secondary | ICD-10-CM | POA: Diagnosis not present

## 2023-11-14 DIAGNOSIS — M5124 Other intervertebral disc displacement, thoracic region: Secondary | ICD-10-CM | POA: Diagnosis not present

## 2023-11-14 DIAGNOSIS — M4802 Spinal stenosis, cervical region: Secondary | ICD-10-CM | POA: Diagnosis not present

## 2023-11-14 DIAGNOSIS — M4602 Spinal enthesopathy, cervical region: Secondary | ICD-10-CM | POA: Diagnosis not present

## 2023-11-14 DIAGNOSIS — M50221 Other cervical disc displacement at C4-C5 level: Secondary | ICD-10-CM | POA: Diagnosis not present

## 2023-11-14 DIAGNOSIS — M438X4 Other specified deforming dorsopathies, thoracic region: Secondary | ICD-10-CM | POA: Diagnosis not present

## 2023-11-14 DIAGNOSIS — M5134 Other intervertebral disc degeneration, thoracic region: Secondary | ICD-10-CM | POA: Diagnosis not present

## 2023-11-14 DIAGNOSIS — M50223 Other cervical disc displacement at C6-C7 level: Secondary | ICD-10-CM | POA: Diagnosis not present

## 2023-11-16 DIAGNOSIS — I959 Hypotension, unspecified: Secondary | ICD-10-CM | POA: Diagnosis not present

## 2023-11-16 DIAGNOSIS — I951 Orthostatic hypotension: Secondary | ICD-10-CM | POA: Diagnosis not present

## 2023-12-02 ENCOUNTER — Ambulatory Visit: Admitting: Family Medicine

## 2023-12-03 DIAGNOSIS — M5412 Radiculopathy, cervical region: Secondary | ICD-10-CM | POA: Diagnosis not present

## 2023-12-08 DIAGNOSIS — H2513 Age-related nuclear cataract, bilateral: Secondary | ICD-10-CM | POA: Diagnosis not present

## 2023-12-08 DIAGNOSIS — D497 Neoplasm of unspecified behavior of endocrine glands and other parts of nervous system: Secondary | ICD-10-CM | POA: Diagnosis not present

## 2023-12-08 DIAGNOSIS — H5347 Heteronymous bilateral field defects: Secondary | ICD-10-CM | POA: Diagnosis not present

## 2023-12-08 DIAGNOSIS — D352 Benign neoplasm of pituitary gland: Secondary | ICD-10-CM | POA: Diagnosis not present

## 2023-12-15 DIAGNOSIS — J449 Chronic obstructive pulmonary disease, unspecified: Secondary | ICD-10-CM | POA: Diagnosis not present

## 2023-12-15 DIAGNOSIS — R42 Dizziness and giddiness: Secondary | ICD-10-CM | POA: Diagnosis not present

## 2023-12-15 DIAGNOSIS — R519 Headache, unspecified: Secondary | ICD-10-CM | POA: Diagnosis not present

## 2023-12-15 DIAGNOSIS — F17210 Nicotine dependence, cigarettes, uncomplicated: Secondary | ICD-10-CM | POA: Diagnosis not present

## 2023-12-15 DIAGNOSIS — R1110 Vomiting, unspecified: Secondary | ICD-10-CM | POA: Diagnosis not present

## 2023-12-15 DIAGNOSIS — G43019 Migraine without aura, intractable, without status migrainosus: Secondary | ICD-10-CM | POA: Diagnosis not present

## 2023-12-24 ENCOUNTER — Ambulatory Visit: Admitting: Dermatology

## 2023-12-25 ENCOUNTER — Encounter: Payer: Self-pay | Admitting: Family Medicine

## 2023-12-25 ENCOUNTER — Ambulatory Visit (INDEPENDENT_AMBULATORY_CARE_PROVIDER_SITE_OTHER): Admitting: Family Medicine

## 2023-12-25 VITALS — BP 101/70 | HR 82 | Resp 16 | Ht 65.0 in | Wt 188.1 lb

## 2023-12-25 DIAGNOSIS — Z1211 Encounter for screening for malignant neoplasm of colon: Secondary | ICD-10-CM | POA: Diagnosis not present

## 2023-12-25 DIAGNOSIS — Z0000 Encounter for general adult medical examination without abnormal findings: Secondary | ICD-10-CM | POA: Diagnosis not present

## 2023-12-25 DIAGNOSIS — Z1231 Encounter for screening mammogram for malignant neoplasm of breast: Secondary | ICD-10-CM | POA: Diagnosis not present

## 2023-12-25 DIAGNOSIS — Z124 Encounter for screening for malignant neoplasm of cervix: Secondary | ICD-10-CM

## 2023-12-25 NOTE — Progress Notes (Signed)
 Complete physical exam   Patient: Ann Morales   DOB: 10/22/1975   48 y.o. Female  MRN: [id number] Visit Date: 12/25/2023  Today's healthcare provider: Nancyann Perry, MD   Chief Complaint  Patient presents with   Annual Exam   Subjective    Discussed the use of AI scribe software for clinical note transcription with the patient, who gave verbal consent to proceed.  History of Present Illness   Ann Morales is a 48 year old female who presents for an annual physical exam.  She is due for cervical cancer screening with a Pap test, which was last performed five years ago by gyn. She has not undergone a colonoscopy before.  Her blood work, including thyroid, vitamin levels, and a metabolic panel, was completed in August.  She received the Hep A+Hep B vaccine last year at work,, but has only received two doses.  She experiences swelling in her ankles, which she manages with compression stockings due to previous issues with blood pressure dropping. She has been using the compression stockings for a while.     Lab Results  Component Value Date   NA 133 (L) 07/13/2023   K 4.0 07/13/2023   CREATININE 0.90 07/13/2023   GFRNONAA >60 07/13/2023   GLUCOSE 96 07/13/2023   Lab Results  Component Value Date   TSH 1.240 12/18/2022   Lab Results  Component Value Date   CHOL 182 12/18/2022   HDL 68 12/18/2022   LDLCALC 102 (H) 12/18/2022   TRIG 62 12/18/2022   CHOLHDL 2.7 12/18/2022       Past Medical History:  Diagnosis Date   ADHD (attention deficit hyperactivity disorder)    Anxiety    Arthritis    Asthma    well controlled   Bipolar disorder (HCC)    COVID-19 03/29/2020   Depression    GERD (gastroesophageal reflux disease)    Headache    migraines   History of kidney stones    currently as of 05-30-19   Sleep apnea    cpap   Wears dentures    partial upper   Past Surgical History:  Procedure Laterality Date   BACK SURGERY  03/2017   cervical  fusion   CHOLECYSTECTOMY     ETHMOIDECTOMY Right 06/12/2020   Procedure: TOTAL ETHMOIDECTOMY;  Surgeon: Blair Mt, MD;  Location: [facility];  Service: ENT;  Laterality: Right;   FRONTAL SINUS EXPLORATION Right 06/12/2020   Procedure: FRONTAL SINUS EXPLORATION;  Surgeon: Blair Mt, MD;  Location: [facility];  Service: ENT;  Laterality: Right;   GALLBLADDER SURGERY     HYSTEROSCOPY WITH NOVASURE N/A 06/02/2019   Procedure: DILATATION & HYSTEROSCOPY WITH NOVASURE ABLATION;  Surgeon: Lake Read, MD;  Location: [facility];  Service: Gynecology;  Laterality: N/A;   IMAGE GUIDED SINUS SURGERY N/A 06/12/2020   Procedure: IMAGE GUIDED SINUS SURGERY;  Surgeon: Blair Mt, MD;  Location: [facility];  Service: ENT;  Laterality: N/A;  PLACED DISK ON OR CHARGE NURSE DESK 3-25 KP   LAPAROSCOPY N/A 12/21/2018   Procedure: LAPAROSCOPY DIAGNOSTIC;  Surgeon: Leonce Garnette BIRCH, MD;  Location: [facility];  Service: Gynecology;  Laterality: N/A;   MAXILLARY ANTROSTOMY Right 06/12/2020   Procedure: MAXILLARY ANTROSTOMY;  Surgeon: Blair Mt, MD;  Location: [facility];  Service: ENT;  Laterality: Right;  Covid (+) 03/29/20 sleep apnea   PLANTAR FASCIA RELEASE Left 06/12/2017   Procedure: ENDOSCOPIC PLANTAR FASCIOTOMY-RELEASE;  Surgeon: Ashley Soulier, DPM;  Location: [facility];  Service: Podiatry;  Laterality: Left;   REPAIR EXTENSOR TENDON Left 06/12/2017   Procedure: REPAIR FLEXOR TENDON;  Surgeon: Ashley Soulier, DPM;  Location: [facility];  Service: Podiatry;  Laterality: Left;   TARSAL TUNNEL RELEASE Left 06/12/2017   Procedure: TARSAL TUNNEL RELEASE-BAXTER RELEASE;  Surgeon: Ashley Soulier, DPM;  Location: [facility];  Service: Podiatry;  Laterality: Left;   TUBAL LIGATION     Social History   Socioeconomic History   Marital status: Single    Spouse name: Not on file   Number of children: Not on file   Years of education: Not on file   Highest education level: 12th  grade  Occupational History   Not on file  Tobacco Use   Smoking status: Every Day    Current packs/day: 2.00    Average packs/day: 2.0 packs/day for 29.2 years (58.5 ttl pk-yrs)    Types: Cigarettes    Start date: 09/25/1994   Smokeless tobacco: Never  Vaping Use   Vaping status: Every Day  Substance and Sexual Activity   Alcohol use: No    Alcohol/week: 0.0 standard drinks of alcohol   Drug use: No   Sexual activity: Yes    Birth control/protection: Surgical  Other Topics Concern   Not on file  Social History Narrative   Not on file   Social Drivers of Health   Financial Resource Strain: Patient Declined (12/05/2023)   Received from [facility]   Overall Financial Resource Strain (CARDIA)    Difficulty of Paying Living Expenses: Patient declined  Food Insecurity: Patient Declined (12/05/2023)   Received from [facility]   Hunger Vital Sign    Within the past 12 months, you worried that your food would run out before you got the money to buy more.: Patient declined    Within the past 12 months, the food you bought just didn't last and you didn't have money to get more.: Patient declined  Transportation Needs: No Transportation Needs (12/05/2023)   Received from [facility] - Transportation    In the past 12 months, has lack of transportation kept you from medical appointments or from getting medications?: No    Lack of Transportation (Non-Medical): No  Physical Activity: Unknown (12/22/2022)   Exercise Vital Sign    Days of Exercise per Week: 0 days    Minutes of Exercise per Session: Not on file  Stress: Stress Concern Present (12/22/2022)   [employer] of Occupational Health - Occupational Stress Questionnaire    Feeling of Stress : Very much  Social Connections: Socially Isolated (12/22/2022)   Social Connection and Isolation Panel    Frequency of Communication with Friends and Family: Twice a week     Frequency of Social Gatherings with Friends and Family: Twice a week    Attends Religious Services: Never    Database administrator or Organizations: No    Attends Engineer, structural: Not on file    Marital Status: Never married  Catering manager Violence: Not on file   Family Status  Relation Name Status   Father  Alive   Mother  Alive   Brother  Alive   MGM  Alive   MGF  Deceased   PGM  Deceased   PGF  Deceased  No partnership data on file   Family History  Problem Relation Age of Onset   Hypercholesterolemia Father    Bipolar disorder Mother  Hypercholesterolemia Maternal Grandmother    Hypertension Maternal Grandmother    Anxiety disorder Maternal Grandmother    Depression Maternal Grandmother    Ovarian cancer Maternal Grandmother    Colon cancer Maternal Grandfather 60   Ovarian cancer Paternal Grandmother    Allergies  Allergen Reactions   Calamine-Zinc Oxide Itching   Codeine Hives, Shortness Of Breath and Other (See Comments)   Doxycycline  Nausea And Vomiting   Other Other (See Comments)    Stomach pain  Severe abdominal pain.  Stomach pain Severe abdominal pain.   Sulfa Antibiotics Other (See Comments)    Severe abdominal pain.  Stomach pain  Severe abdominal pain.  Stomach pain Severe abdominal pain.    Stomach pain Severe abdominal pain.    Severe abdominal pain.   Zinc Other (See Comments)    Burning sensation   Azithromycin Diarrhea    Patient Care Team: Gasper Nancyann BRAVO, MD as PCP - General (Family Medicine)   Medications: Outpatient Medications Prior to Visit  Medication Sig   AJOVY 225 MG/1.5ML SOAJ INJECT 225 MG SUBCUTANEOUSLY MONTHLY   amphetamine-dextroamphetamine (ADDERALL) 15 MG tablet Take 1 tablet by mouth daily.   clobetasol  ointment (TEMOVATE ) 0.05 % Apply 1 Application topically as directed. qd to aa ears and elbows for psoriasis until clear, then prn flares, avoid face, groin, axilla   clonazePAM  (KLONOPIN ) 0.5  MG tablet Take 0.5 mg by mouth daily.   lithium  carbonate 300 MG capsule Take 300 mg by mouth 2 (two) times daily.   lurasidone (LATUDA) 40 MG TABS tablet Take 40 mg by mouth daily. Taking 2 tablets daily   midodrine  (PROAMATINE ) 5 MG tablet Take 2 tablets (10 mg total) by mouth 2 (two) times daily with a meal AND 1 tablet (5 mg total) at bedtime. Take 2 tablets (10 mg total) by mouth 2 (two) times daily with a meal AND 1 tablet (5 mg total) at bedtime.   naloxone (NARCAN) nasal spray 4 mg/0.1 mL SMARTSIG:Both Nares   pregabalin (LYRICA) 25 MG capsule 1-2 caps po at bedtime   SUMAtriptan  (IMITREX ) 50 MG tablet Take 1 tablet (50 mg total) by mouth daily as needed for migraine. May repeat in 2 hours if headache persists or recurs.   topiramate  (TOPAMAX ) 50 MG tablet 1/2 tablet every evening for 7 days, then 1/2 tablet twice a day for 7 days, then 1/2 in the morning and 1 at night for 7 days, then 1 tablet twice a day   traZODone  (DESYREL ) 100 MG tablet Take 1-2 tablets 45 minutes prior to sleep for insomnia.   cyclobenzaprine  (FLEXERIL ) 10 MG tablet Take 1 tablet (10 mg total) by mouth 3 (three) times daily as needed for muscle spasms. (Patient not taking: Reported on 07/17/2023)   neomycin -polymyxin-hydrocortisone (CORTISPORIN) OTIC solution Place 3 drops into both ears 3 (three) times daily as needed.   No facility-administered medications prior to visit.    Review of Systems  Constitutional:  Negative for appetite change, chills, fatigue and fever.  Respiratory:  Negative for chest tightness and shortness of breath.   Cardiovascular:  Negative for chest pain and palpitations.  Gastrointestinal:  Negative for abdominal pain, nausea and vomiting.  Neurological:  Negative for dizziness and weakness.       Objective    BP 101/70 (BP Location: Left Arm, Patient Position: Sitting, Cuff Size: Normal)   Pulse 82   Resp 16   Ht 5' 5 (1.651 m)   Wt 188 lb 1.6 oz (85.3 kg)  SpO2 100%   BMI  31.30 kg/m     Physical Exam  General Appearance:    Obese female. Alert, cooperative, in no acute distress, appears stated age   Head:    Normocephalic, without obvious abnormality, atraumatic  Eyes:    PERRL, conjunctiva/corneas clear, EOM's intact, fundi    benign, both eyes  Ears:    Normal TM's and external ear canals, both ears  Nose:   Nares normal, septum midline, mucosa normal, no drainage    or sinus tenderness  Throat:   Lips, mucosa, and tongue normal; teeth and gums normal  Neck:   Supple, symmetrical, trachea midline, no adenopathy;    thyroid:  no enlargement/tenderness/nodules; no carotid   bruit or JVD  Back:     Symmetric, no curvature, ROM normal, no CVA tenderness  Lungs:     Clear to auscultation bilaterally, respirations unlabored  Chest Wall:    No tenderness or deformity   Heart:    Normal heart rate. Normal rhythm. No murmurs, rubs, or gallops.   Breast Exam:    deferred  Abdomen:     Soft, non-tender, bowel sounds active all four quadrants,    no masses, no organomegaly  Pelvic:    deferred  Extremities:   All extremities are intact. No cyanosis or edema  Pulses:   2+ and symmetric all extremities  Skin:   Skin color, texture, turgor normal, no rashes or lesions  Lymph nodes:   Cervical, supraclavicular, and axillary nodes normal  Neurologic:   CNII-XII intact, normal strength, sensation and reflexes    throughout       Last depression screening scores    12/25/2023    3:20 PM 06/26/2023   10:45 AM 01/01/2023    3:22 PM  PHQ 2/9 Scores  PHQ - 2 Score 0 0 6  PHQ- 9 Score   21   Last fall risk screening    12/25/2023    3:20 PM  Fall Risk   Falls in the past year? 0  Number falls in past yr: 0  Injury with Fall? 0  Risk for fall due to : No Fall Risks   Last Audit-C alcohol use screening    12/22/2022    1:42 PM  Alcohol Use Disorder Test (AUDIT)  1. How often do you have a drink containing alcohol? 1  2. How many drinks containing  alcohol do you have on a typical day when you are drinking? 0  3. How often do you have six or more drinks on one occasion? 0  AUDIT-C Score 1      Patient-reported   A score of 3 or more in women, and 4 or more in men indicates increased risk for alcohol abuse, EXCEPT if all of the points are from question 1   No results found for any visits on 12/25/23.  Assessment & Plan    Routine Health Maintenance and Physical Exam  Exercise Activities and Dietary recommendations  Goals   None     Immunization History  Administered Date(s) Administered   Hep A / Hep B 01/31/2023, 06/01/2023   Moderna Sars-Covid-2 Vaccination 08/09/2019, 09/06/2019   PNEUMOCOCCAL CONJUGATE-20 06/01/2023   Tdap 06/12/2010, 01/31/2023    Health Maintenance  Topic Date Due   Mammogram  Never done   Colonoscopy  Never done   Hepatitis B Vaccines 19-59 Average Risk (3 of 3 - 19+ 3-dose series) 07/31/2023   COVID-19 Vaccine (3 - 2025-26 season) 11/09/2023  Cervical Cancer Screening (HPV/Pap Cotest)  11/25/2023   Influenza Vaccine  01/08/2024 (Originally 10/09/2023)   DTaP/Tdap/Td (3 - Td or Tdap) 01/30/2033   Pneumococcal Vaccine  Completed   Hepatitis C Screening  Completed   HIV Screening  Completed   HPV VACCINES  Aged Out   Meningococcal B Vaccine  Aged Out    Discussed health benefits of physical activity, and encouraged her to engage in regular exercise appropriate for her age and condition.     Adult Wellness Visit Routine examination with normal cardiopulmonary findings and blood pressure. Recent labs satisfactory. - Schedule physical for next year.   Screening for cervical cancer (Pap test) Due for cervical cancer screening after five years. She agreed to see Dr. Elspeth for the test. - Send referral to Dr. Elspeth at [facility] for Pap test.  Screening for colorectal cancer (colonoscopy) Never had a colonoscopy. Discussed importance of screening, and she agreed to  schedule.  Hepatitis A/B vaccination, records only show she's been given two doses.   - Advise her to verify with pharmacy if a third dose is needed.  Edema of ankles Chronic ankle edema managed effectively with compression stockings as per cardiologist's advice.  General Health Maintenance Reviewed satisfactory recent blood work and vaccinations. - No additional blood work required at this time.     Return in about 1 year (around 12/24/2024) for Yearly Physical.        Nancyann Perry, MD  [facility] [phone] (phone) [phone] (fax)  [facility]

## 2023-12-25 NOTE — Patient Instructions (Signed)
 Please review the attached list of medications and notify my office if there are any errors.   Please bring all of your medications to every appointment so we can make sure that our medication list is the same as yours.   Check with the pharmacist at [facility] to make sure you don't need a third dose of the Hepatitis Vaccines

## 2024-01-05 DIAGNOSIS — F410 Panic disorder [episodic paroxysmal anxiety] without agoraphobia: Secondary | ICD-10-CM | POA: Diagnosis not present

## 2024-01-05 DIAGNOSIS — F3160 Bipolar disorder, current episode mixed, unspecified: Secondary | ICD-10-CM | POA: Diagnosis not present

## 2024-01-05 DIAGNOSIS — F319 Bipolar disorder, unspecified: Secondary | ICD-10-CM | POA: Diagnosis not present

## 2024-01-05 DIAGNOSIS — F900 Attention-deficit hyperactivity disorder, predominantly inattentive type: Secondary | ICD-10-CM | POA: Diagnosis not present

## 2024-01-10 DIAGNOSIS — E236 Other disorders of pituitary gland: Secondary | ICD-10-CM | POA: Diagnosis not present

## 2024-01-10 DIAGNOSIS — F17210 Nicotine dependence, cigarettes, uncomplicated: Secondary | ICD-10-CM | POA: Diagnosis not present

## 2024-01-10 DIAGNOSIS — R42 Dizziness and giddiness: Secondary | ICD-10-CM | POA: Diagnosis not present

## 2024-01-10 DIAGNOSIS — J449 Chronic obstructive pulmonary disease, unspecified: Secondary | ICD-10-CM | POA: Diagnosis not present

## 2024-01-10 DIAGNOSIS — R079 Chest pain, unspecified: Secondary | ICD-10-CM | POA: Diagnosis not present

## 2024-01-10 DIAGNOSIS — R0789 Other chest pain: Secondary | ICD-10-CM | POA: Diagnosis not present

## 2024-01-10 LAB — LAB REPORT - SCANNED: EGFR: 74

## 2024-01-11 DIAGNOSIS — F17200 Nicotine dependence, unspecified, uncomplicated: Secondary | ICD-10-CM | POA: Diagnosis not present

## 2024-01-11 DIAGNOSIS — F909 Attention-deficit hyperactivity disorder, unspecified type: Secondary | ICD-10-CM | POA: Diagnosis not present

## 2024-01-11 DIAGNOSIS — F418 Other specified anxiety disorders: Secondary | ICD-10-CM | POA: Diagnosis not present

## 2024-01-11 DIAGNOSIS — F315 Bipolar disorder, current episode depressed, severe, with psychotic features: Secondary | ICD-10-CM | POA: Diagnosis not present

## 2024-01-19 DIAGNOSIS — G4733 Obstructive sleep apnea (adult) (pediatric): Secondary | ICD-10-CM | POA: Diagnosis not present

## 2024-01-19 DIAGNOSIS — F17210 Nicotine dependence, cigarettes, uncomplicated: Secondary | ICD-10-CM | POA: Diagnosis not present

## 2024-01-19 DIAGNOSIS — J449 Chronic obstructive pulmonary disease, unspecified: Secondary | ICD-10-CM | POA: Diagnosis not present

## 2024-01-19 DIAGNOSIS — Z01818 Encounter for other preprocedural examination: Secondary | ICD-10-CM | POA: Diagnosis not present

## 2024-01-19 DIAGNOSIS — Z9889 Other specified postprocedural states: Secondary | ICD-10-CM | POA: Diagnosis not present

## 2024-01-19 DIAGNOSIS — J343 Hypertrophy of nasal turbinates: Secondary | ICD-10-CM | POA: Diagnosis not present

## 2024-01-19 DIAGNOSIS — D352 Benign neoplasm of pituitary gland: Secondary | ICD-10-CM | POA: Diagnosis not present

## 2024-01-19 DIAGNOSIS — J342 Deviated nasal septum: Secondary | ICD-10-CM | POA: Diagnosis not present

## 2024-01-19 DIAGNOSIS — J3489 Other specified disorders of nose and nasal sinuses: Secondary | ICD-10-CM | POA: Diagnosis not present

## 2024-01-19 DIAGNOSIS — I951 Orthostatic hypotension: Secondary | ICD-10-CM | POA: Diagnosis not present

## 2024-01-20 ENCOUNTER — Other Ambulatory Visit (HOSPITAL_COMMUNITY): Payer: Self-pay | Admitting: Cardiology

## 2024-01-20 DIAGNOSIS — Z01818 Encounter for other preprocedural examination: Secondary | ICD-10-CM | POA: Diagnosis not present

## 2024-01-20 DIAGNOSIS — Z720 Tobacco use: Secondary | ICD-10-CM | POA: Diagnosis not present

## 2024-01-20 DIAGNOSIS — R079 Chest pain, unspecified: Secondary | ICD-10-CM | POA: Diagnosis not present

## 2024-01-20 DIAGNOSIS — R072 Precordial pain: Secondary | ICD-10-CM

## 2024-01-20 DIAGNOSIS — I951 Orthostatic hypotension: Secondary | ICD-10-CM | POA: Diagnosis not present

## 2024-01-20 NOTE — Progress Notes (Addendum)
 Established patient Visit    Chief Complaint: Chest pain Date of Service: 01/20/2024 Date of Birth: 1975/04/18 PCP: Gasper Nancyann BRAVO, MD  History of Present Illness: Ann Morales is a 48 y.o.female patient who presented for chest pain  Past medical history significant for migraine, orthostatic hypotension diagnosed 2023 on midodrine , pituitary macroadenoma, bipolar disorder, 30 pack year smoking history- continues to smoke, family history of MI in maternal grandfather in 79s.  Echocardiogram 10/2021 with normal biventricular systolic function, LVEF 60 to 34% with no significant valvular abnormality.  Patient having symptoms of chest pain in last couple months, describes as midsternal aching/tightness which happen at rest or activity, no clear relation to exertion.  Currently having symptoms every day.  She presented to ED recently with similar symptoms with negative troponin.  EKG today showing sinus rhythm with nonspecific T wave changes.  Has intermittent dizziness.  No palpitations or syncope.  She is under a lot of stress secondary to family issues  Past Medical and Surgical History  Past Medical History Past Medical History:  Diagnosis Date  . Asthma without status asthmaticus (HHS-HCC)   . Bronchitis   . Chest pain   . Depression 2013  . GERD (gastroesophageal reflux disease) 2013  . Hyperlipidemia   . Migraine headache   . Sinusitis, unspecified   . Sleep apnea 2017  . Tonsillitis     Past Surgical History She has a past surgical history that includes Tubal ligation; arthrodesis anterior cervicle spine (N/A, 03/13/2017); instrumentation anterior spine 2 to 3 vertebral segments (N/A, 03/13/2017); insertion morselized bone allograft for spine surgery (N/A, 03/13/2017); left foot surgery for plantar fasciitis (2020); Functional endoscopic sinus surgery (06/12/2020); and Cholecystectomy (2008).   Medications and Allergies  Current Medications Current Outpatient Medications  Medication  Sig Dispense Refill  . clobetasoL  (TEMOVATE ) 0.05 % ointment APPLY TOPICALLY (TOTAL OF 2GM) ONCE DAILY AS DIRECTED TO AFFECTED AREAS OF EARS AND ELBOWS FOR PSORIASIS UNTIL CLEAR. THEN USE AS NEEDED FOR FLARES. AVOID FACE, GROIN, AND AXILLA    . clonazePAM  (KLONOPIN ) 0.5 MG tablet Take 0.5 mg by mouth once daily    . dextroamphetamine-amphetamine (ADDERALL XR) 15 MG XR capsule Take 15 mg by mouth 2 (two) times daily .    . fremanezumab-vfrm 225 mg/1.5 mL AtIn Inject 225 mg subcutaneously monthly (Patient taking differently: Inject 225 mg subcutaneously monthly [place]y) 1.5 mL 6  . gabapentin (NEURONTIN) 100 MG capsule Take 1 capsule (100 mg total) by mouth nightly 90 capsule 0  . ibuprofen  (MOTRIN ) 200 MG tablet Take 800 mg by mouth every 6 (six) hours as needed for Pain    . lithium  carbonate 300 mg tablet Take 300 mg by mouth every 12 (twelve) hours      . lurasidone (LATUDA) 80 mg Tab tablet Take 80 mg by mouth once daily    . meclizine  (ANTIVERT ) 12.5 mg tablet take 1 tablet (12.5 mg total) by mouth three (3) times a day as needed.    . midodrine  (PROAMATINE ) 5 MG tablet Take by mouth 10 mg two times a day and 5 mg nightly    . naloxone (NARCAN) 4 mg/actuation nasal spray CALL 911. ADMINISTER A SINGLE SPRAY IN ONE NOSTRIL. IF NO TO MINIMAL RESPONSE AFTER 2 TO 3 MINUTES, AN ADDITIONAL DOSE MAY BE GIVEN IN THE ALTERNATE NOSTRIL.    . SUMAtriptan  (IMITREX ) 50 MG tablet Take one tablet at onset of headache; may repeat in 2 hours if not better    . topiramate  (TOPAMAX ) 50  MG tablet Take 50 mg by mouth once daily    . traZODone  (DESYREL ) 150 MG tablet Take 150 mg by mouth nightly  2  . famotidine (PEPCID) 20 MG tablet TAKE 1 TABLET (20 MG TOTAL) BY MOUTH TWO (2) TIMES A DAY FOR 15 DAYS. (Patient not taking: Reported on 01/20/2024)    . ivabradine (CORLANOR) 5 mg tablet Take 3 tablets (15 mg total) by mouth once for 1 dose 3 tablet 0  . nicotine (NICODERM CQ) 21 mg/24 hr patch Apply one patch daily x 12  weeks. (Patient not taking: Reported on 01/20/2024) 84 patch 1  . nicotine polacrilex (NICORETTE) 4 MG lozenge Place 1 lozenge between cheek and gum every 30 minutes for smoking urges. (Patient not taking: Reported on 01/20/2024) 81 lozenge 8  . pregabalin (LYRICA) 25 MG capsule 1-2 caps po at bedtime (Patient not taking: Reported on 01/20/2024)     No current facility-administered medications for this visit.    Allergies Codeine, Azithromycin, Calamine-zinc oxide, Doxycycline , Sulfa (sulfonamide antibiotics), and Zinc  Social and Family History  Social History  reports that she has been smoking cigarettes. She started smoking about 31 years ago. She has a 31.9 pack-year smoking history. She has never used smokeless tobacco. She reports that she does not drink alcohol and does not use drugs.  Family History family history includes Asthma in her maternal grandfather; COPD in her maternal grandfather; Colon cancer in her maternal grandfather; Depression in her mother; Diabetes type II in an other family member; Heart disease in her maternal grandfather and another family member; Hyperlipidemia (Elevated cholesterol) in her father; Kidney disease in her paternal grandmother; Lung cancer in her maternal grandfather; Osteoarthritis in her mother; Reflux disease in her mother; Sleep apnea in her maternal grandmother; Thyroid disease in an other family member.   Review of Systems   Review of Systems: The patient denies chest pain, shortness of breath, orthopnea, paroxysmal nocturnal dyspnea, pedal edema, palpitations, heart racing, presyncope, syncope.    Physical Examination   Vitals:BP 110/70 (BP Location: Left upper arm, Patient Position: Sitting, BP Cuff Size: Large Adult)   Pulse 76   Resp 12   Ht 165.1 cm (5' 5)   Wt 87 kg (191 lb 12.8 oz)   SpO2 99%   BMI 31.92 kg/m  Ht:165.1 cm (5' 5) Wt:87 kg (191 lb 12.8 oz) ADJ:Anib surface area is 2 meters squared. Body mass index is 31.92  kg/m.  HEENT: Pupils equally reactive to light and accomodation  Neck: Supple, no significant JVD Lungs: clear to auscultation bilaterally; no wheezes, rales, rhonchi Heart: Regular rate and rhythm. No murmur Extremities: no pedal edema  Assessment and Plan   48 y.o. female with  Atypical chest pain Preop risk stratification, for pituitary adenoma surgery Tobacco abuse with 30-pack-year history, continues to smoke Family history of CAD Chronic orthostatic hypotension Migraine, bipolar disorder  Patient with atypical chest pain symptoms as detailed in history, happening more often recently.  With her CAD risk factors and chest pain symptoms, will have ASAP CTA coronary for further evaluation. CTA coronary scheduled for tomorrow at 12:30 PM. Regarding preop risk stratification, final recommendations after CTA coronaries.  If CTA coronaries with no severe stenosis, she will be at low risk for surgery.  Will communicate to surgery team after tomorrow's scan  Regarding chronic orthostatic hypotension, continue midodrine  Continue leg stockings Recommended to drink 80 to 90 ounces of water daily.  Addendum 01/21/2024: CTA coronaries cannot be performed today with blood  pressure systolics in 80s.  This is likely contributed by mild sinus bradycardia (as she received ivabradine for coronary CTA) along with her baseline soft blood pressure.  Would recommend having cardiac evaluation before proceeding with surgery.  Will try to have stress test done next week.  If stress test has no ischemia, she will be at low risk for surgery.  Will also start fludrocortisone  0.1 mg daily to help with her orthostatic hypotension.  Continue midodrine   Orders Placed This Encounter  Procedures  . CT heart angiogram  . ECG 12-lead    Keep follow-up scheduled for next year  Ann MYRENE PATERSON, MD  This dictation was prepared with dragon dictation. Any transcription errors that result from this process  are unintentional.

## 2024-01-21 ENCOUNTER — Ambulatory Visit
Admission: RE | Admit: 2024-01-21 | Discharge: 2024-01-21 | Disposition: A | Discharge status: Home / Self Care | Source: Ambulatory Visit | Attending: Cardiology | Admitting: Cardiology

## 2024-01-21 DIAGNOSIS — R072 Precordial pain: Secondary | ICD-10-CM | POA: Diagnosis not present

## 2024-01-21 MED ORDER — SODIUM CHLORIDE 0.9 % IV BOLUS
500.0000 mL | Freq: Once | INTRAVENOUS | Status: AC
  Administered 2024-01-21: 500 mL via INTRAVENOUS

## 2024-01-21 MED ORDER — NITROGLYCERIN 0.4 MG SL SUBL: 0.8000 mg | SUBLINGUAL_TABLET | Freq: Once | SUBLINGUAL | Status: DC

## 2024-01-21 MED ORDER — IOHEXOL 350 MG/ML SOLN: 100.0000 mL | Freq: Once | INTRAVENOUS | Status: DC | PRN

## 2024-01-21 MED ORDER — DILTIAZEM HCL 25 MG/5ML IV SOLN: 10.0000 mg | INTRAVENOUS | Status: DC | PRN

## 2024-01-21 MED ORDER — METOPROLOL TARTRATE 5 MG/5ML IV SOLN: 10.0000 mg | INTRAVENOUS | Status: DC | PRN

## 2024-01-21 NOTE — Progress Notes (Signed)
 1253- Pt left with fiance.

## 2024-01-21 NOTE — Progress Notes (Signed)
 Pt came in with a soft BP (please see vitals). Per protocol a 500mL NS bolus started. And BP continues to decrease. This RN called Dr. Wilburn who stated to finish the bolus and if the systolic BP comes up to the 90s we can proceed with the study and nitroglycerin and give another 500mL bolus if needed. Pt reports a little dizziness but says she did take her mitrogen prior to coming in. Pt alert and Dr. Wilburn aware. Fiance brought back and given an update as per pt request.

## 2024-01-21 NOTE — Progress Notes (Signed)
 Dr. Wilburn at bedside at 1216.First 500mL of NS bolus is complete. HR is 46 with a BP of 81/53. Repeat 500mL of NS bolus ordered. Please see MAR.  1230 - of NS has infused.   1239- Patient states that she has chronic low BP and is chronically dizzy and her dizziness is not worse that her baseline.   1245- Dr. Wilburn Back at bedside and aware of the 1247 vitals. Per. Dr. Wilburn scan to be canceled and rescheduled.   1253- Pt states at baseline. Pt up and walking by herself.

## 2024-01-25 DIAGNOSIS — I951 Orthostatic hypotension: Secondary | ICD-10-CM | POA: Diagnosis not present

## 2024-01-25 DIAGNOSIS — R0789 Other chest pain: Secondary | ICD-10-CM | POA: Diagnosis not present

## 2024-02-03 ENCOUNTER — Other Ambulatory Visit: Payer: Self-pay | Admitting: Family Medicine

## 2024-02-03 DIAGNOSIS — G903 Multi-system degeneration of the autonomic nervous system: Secondary | ICD-10-CM

## 2024-02-03 DIAGNOSIS — M5416 Radiculopathy, lumbar region: Secondary | ICD-10-CM

## 2024-02-08 DIAGNOSIS — Z720 Tobacco use: Secondary | ICD-10-CM | POA: Diagnosis not present

## 2024-02-08 DIAGNOSIS — I951 Orthostatic hypotension: Secondary | ICD-10-CM | POA: Diagnosis not present

## 2024-02-08 DIAGNOSIS — Z01818 Encounter for other preprocedural examination: Secondary | ICD-10-CM | POA: Diagnosis not present

## 2024-02-08 DIAGNOSIS — R0789 Other chest pain: Secondary | ICD-10-CM | POA: Diagnosis not present

## 2024-03-17 ENCOUNTER — Encounter (HOSPITAL_COMMUNITY): Payer: Self-pay

## 2024-04-04 NOTE — Progress Notes (Signed)
 " Otolaryngology - Head & Neck Surgery - Progress Note   Attending: Jordan Michael Komisarow, MD              Date: 04/04/2024  Admission Date: 04/01/2024   Length of Stay: 3  Diagnosis: Pituitary adenoma (CMS/HHS-HCC) [D35.2]  Procedure: NEUROENDOSCOPY INTRACRANIAL WITH EXCISION PITUITARY TUMOR TRANSNASAL OR TRANSSPHENOIDAL APPROACH ADJACENT TISSUE TRANSFER OR REARRANGEMENT, NOSE; DEFECT 10 SQ CM OR LESS UNLISTED PROCEDURE, SKIN, MUCOUS MEMBRANE, AND SUBCUTANEOUS TISSUE STEREOTACTIC COMPUTER-ASSISTED (NAVIGATIONAL) PROCEDURE; CRANIAL, INTRADURAL (LIST IN ADDITION TO PRIMARY PROCEDURE) GRAFT; DERMA-FAT-FASCIA; ABDOMEN POD: 3 Days Post-Op  24 hour events   - Vital signs stable, no acute overnight events.  - Doing overall well. - She endorses bilateral intermittent blood-tinged nasal drainage that she attributes to nasal saline rinses.  - Denies any double vision, vision changes, or salty or metallic taste of the mouth.   HPI   Ann Morales is a 49 y.o. female presenting with pituitary adenoma c/b bitemporal hemianopsia.  The patient is 3 Days Post-Op s/p endoscopic endonasal approach for pituitary resection, reconstructed with fat graft and right merocel nasal packing placed .   Objective   Physical Exam  Current Vital Signs 24h Vital Sign Ranges  T 36.7 C (98.1 F) (04/04/24 1216) Temp  Avg: 36.6 C (97.9 F)  Min: 36.4 C (97.6 F)  Max: 36.7 C (98.1 F)  BP (!) 142/78 (04/04/24 1216) BP  Min: 123/79  Max: 149/88  HR 57 (04/04/24 0800) Pulse  Avg: 61.3  Min: 57  Max: 65  RR 16 (04/04/24 1216) Resp  Avg: 15  Min: 14  Max: 16  O2sat 98 %   SpO2  Avg: 97.6 %  Min: 97 %  Max: 98 %   General: Sitting on side of bed. Nose: Nose externally straight, minimal amount of bloody drainage from R nare with merocel in place.  Oral Cavity / Oropharynx: Mucosa normal in appearance Neck: Neck soft and flat  Neurological: EOMI, PERRL; VF and CN V intact b/l. CV: No LE edema  Resp:  Breathing unlabored on Room Air  Diet: Diet regular   Activity: as tolerated  Drains: - None Output by Drain (mL) 04/02/24 0701 - 04/02/24 1900 04/02/24 1901 - 04/03/24 0700 04/03/24 0701 - 04/03/24 1900 04/03/24 1901 - 04/04/24 0700 04/04/24 0701 - 04/04/24 1351  Patient has no LDAs of requested type attached.     Pertinent Labs Recent Labs  Lab 04/02/24 0833  WBC 11.6*  HGB 11.5*  HCT 37.0  PLT 253    Recent Labs  Lab 04/02/24 0833 04/02/24 2044 04/03/24 0955 04/03/24 1909 04/03/24 2225 04/04/24 0343 04/04/24 0843  NA 142   < > 145 141 141 140 138  K 3.3*   < > 3.7 4.2  --   --  3.5  CL 105   < > 107 106  --   --  105  CO2 25   < > 31* 28  --   --  28  BUN 4*   < > 2* 3*  --   --  3*  CREATININE 0.8   < > 0.8 0.8  --   --  0.8  GLUCOSE 141*   < > 100 128  --   --  138  CALCIUM 8.6*   < > 9.0 8.5*  --   --  8.9  MG 1.5*  --   --   --   --   --  1.6*   < > =  values in this interval not displayed.    Imaging: No new imaging.  Medications: SCHEDULED acetaminophen  (TYLENOL ) tablet 650 mg, Oral, Q6H SCH cephalexin  (KEFLEX ) capsule 500 mg, Oral, TID clonazePAM  (KlonoPIN ) tablet 0.5 mg, Oral, BID dextroamphetamine-amphetamine (ADDERALL XR) XR capsule 15 mg, Oral, BID enoxaparin  (LOVENOX ) 40 mg/0.4 mL inj syringe 40 mg, Subcutaneous, QHS fludrocortisone  (FLORINEF ) tablet 0.1 mg, Oral, Daily gabapentin (NEURONTIN) capsule 300 mg, Oral, TID hydrocortisone (CORTEF) tablet 15 mg, Oral, Daily hydrocortisone (CORTEF) tablet 5 mg, Oral, Daily lithium  carbonate tablet 300 mg, Oral, Q12H magnesium  hydroxide (MILK OF MAGNESIA) 400 mg/5 mL suspension 15 mL, Oral, QHS magnesium  sulfate 4 g/100 mL IVPB, Intravenous, Once polyethylene glycol (MIRALAX) packet 17 g, Oral, Daily potassium chloride  (K-TAB ) ER tablet 40 mEq, Oral, Once sodium chloride  (OCEAN) 0.65 % nasal spray 2 spray, Both Nares, Q2H WA sodium chloride  irrigation 0.9 % solution 120 mL, Irrigation,  BID traZODone  (DESYREL ) tablet 150 mg, Oral, QHS    PRN butalbital-acetaminophen -caffeine (FIORICET) 50-325-40 mg tablet 1 tablet, Oral, Q6H PRN lidocaine  (XYLOCAINE ) 1 % injection 0.5 mL, Subcutaneous, As Directed ondansetron  (PF) (ZOFRAN ) injection 4 mg, Intravenous, Q8H PRN oxyCODONE  (ROXICODONE ) immediate release tablet 5-10 mg, Oral, Q4H PRN    Assessment   Ann Morales is a 49 y.o. female presenting with pituitary adenoma c/b bitemporal hemianopsia now 3 Days Post-Op s/p endoscopic endonasal approach for pituitary resection, reconstructed with fat graft and right merocel nasal packing placed, progressing well post-operatively.  Patient reports doing well this morning. Remains afebrile and VSS wnl. R nare Merocel in place. Denies any clear rhinorrhea or salty or metallic taste. Plan today includes starting NS irrigations, continue nasal saline sprays prn  and prophylactic antibiotics while merocel remains in place. Appreciate excellent care from NSU.   Plan   #Post Op - Diet: Diet regular - Routine post-op care - Pain control per NSU - Sinus precautions: (No nose blowing  No sneezing  If must sneeze, do so with mouth open  Avoid straining with bowel movements, urination, heavy lifting, etc.  No straw use  No incentive spirometry)  - Continue saline Sprays today - Start Nasal saline irrigations POD3 - Gram positive anitbiotic coverage while merocel remains in place (apt for merocel removal scheduled for 1/30) - Rest of care per NSU  Dispo: per NSU  Please page the ENT floor pager at [phone] with any questions or concerns. If I am unable to be reached, please page the ENT consult pager at [phone].    Brad MOTE Garnette, MD Otolaryngology PGY-1 Department of Head and Neck Surgery and Communication Sciences   ENT Inpatient Pager: [phone] ENT Consult Pager: [phone] Pediatric ENT Pager: [phone]    ------------------------------------------------------------------------------- Attestation signed by Danita Pauletta Shown, MD at 04/05/2024  3:00 PM Attestation Statement:   This service was rendered under my overall direction and control, and I was immediately available via phone/pager or present on site.  RALPH ABI HACHEM, MD  ------------------------------------------------------------------------------- "

## 2024-04-05 NOTE — Progress Notes (Signed)
 Case Manager Discharge Summary / Closing Note  Expected Discharge Date & Time: 04/05/2024 at   Discharge Plan:  Patient is discharging home with routine care and no post-acute services were indicated prior to discharge., The patient has been involved in the development of this plan and is in agreement.    Post-Acute Services Coordinated: No resources indicated at this time  Funding for Discharge Medications: Drug assistance not needed    Transportation: Arrangements: patient arranged Discharge Transportation: private vehicle  Final ADT:  Final ADT Discharge Disposition: Home Based  Home Based: Home or Self Care                 Second IMM Received: Not indicated (04/05/24 1024)  Final Summary: Patient is scheduled to discharge home today. No discharge needs identified at this time.    [facility]  TURNER

## 2024-04-05 NOTE — Progress Notes (Signed)
 Endocrinology sign off note:  Endocrinology consulted for postoperative hormonal management in this patient who is a 49 y.o. 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   Post operative course remarkable for 1) transient DI requiring 2 total doses of dDAVP, POD3 and 4 without recurrence, and 2) new diagnosis of central adrenal insufficiency.   Patient is getting discharged today on home hydrocortisone 15 mg in am and 5 mg in pm with plan for follow up on Friday for repeat Na check. Teaching on adrenal insufficiency done at bedside including the disease, sick day rule, and emergency precautions .  Discussed with attending Dr. Karlene.  Jiazhen Li MD Endocrinology fellow PGY4   ------------------------------------------------------------------------------- Attestation signed by Karlene Delon Etienne, MD at 04/05/2024  5:31 PM Pt was discharged prior to attending rounds, but we discussed plan in detail with patient yesterday and she had concentrated urine this am and did not require additional DDAVP. She was counseled yesterday about  AI and need for steroids. Also discussed s/sxs of when to call (excessive thirst/urination or decrease UOP or not feeling well).  -------------------------------------------------------------------------------

## 2024-04-06 ENCOUNTER — Telehealth: Payer: Self-pay

## 2024-04-06 NOTE — Transitions of Care (Post Inpatient/ED Visit) (Signed)
" ° °  04/06/2024  Name: Ann Morales MRN: [id number] DOB: 06/08/1975  Today's TOC FU Call Status: Today's TOC FU Call Status:: Successful TOC FU Call Completed TOC FU Call Complete Date: 04/06/24  Patient's Name and Date of Birth confirmed. Name, DOB  Transition Care Management Follow-up Telephone Call Date of Discharge: 04/05/24 Discharge Facility: Other ([facility]) Name of Other ([facility]) [facility]charge Facility: [facility] Type of Discharge: Inpatient Admission Primary Inpatient Discharge Diagnosis:: Pituitary Mass s/p surgery How have you been since you were released from the hospital?: Better Any questions or concerns?: No  Items Reviewed: Did you receive and understand the discharge instructions provided?: Yes Medications obtained,verified, and reconciled?: Partial Review Completed Reason for Partial Mediation Review: I don't have my list with me I will get to my place tomorrow Any new allergies since your discharge?: No Dietary orders reviewed?: NA Do you have support at home?: Yes People in Home [RPT]: significant other  Medications Reviewed Today:  Patient states she wasn't currently at home and her DC instructions with medications weren't with her, wants a call back tomorrow Medications Reviewed Today   Medications were not reviewed in this encounter     Home Care and Equipment/Supplies: Were Home Health Services Ordered?: No Any new equipment or medical supplies ordered?: No  Functional Questionnaire: Do you need assistance with bathing/showering or dressing?: No Do you need assistance with meal preparation?: No Do you need assistance with eating?: No Do you have difficulty maintaining continence: No Do you need assistance with getting out of bed/getting out of a chair/moving?: No Do you have difficulty managing or taking your medications?: No  Follow up appointments reviewed: PCP Follow-up appointment confirmed?: No Specialist Hospital  Follow-up appointment confirmed?: Yes Date of Specialist follow-up appointment?: 04/08/24 Follow-Up Specialty Provider:: [facility]; endocrinology  will be a [facility] all day Friday(04/08/24) note Do you need transportation to your follow-up appointment?: No Do you understand care options if your condition(s) worsen?: Yes-patient verbalized understanding  SDOH Interventions Today    Flowsheet Row Most Recent Value  SDOH Interventions   Food Insecurity Interventions Intervention Not Indicated  Housing Interventions Intervention Not Indicated  Transportation Interventions Intervention Not Indicated  Utilities Interventions Intervention Not Indicated   Richerd Fish, RN, BSN, CCM [place]  [facility], [facility] RN Care Manager Direct Dial: [phone]        "

## 2024-04-11 ENCOUNTER — Telehealth: Payer: Self-pay

## 2024-04-11 NOTE — Transitions of Care (Post Inpatient/ED Visit) (Signed)
" ° °  04/11/2024  Name: Ann Morales MRN: [id number] DOB: 19-Nov-1975  Today's TOC FU Call Status: Today's TOC FU Call Status:: Unsuccessful Call (1st Attempt) Unsuccessful Call (1st Attempt) Date: 04/11/24  Attempted to reach the patient regarding the most recent Inpatient/ED visit.  Callback call attempt per request, was unable to complete call.  Follow Up Plan: Additional outreach attempts will be made to reach the patient to complete the Transitions of Care (Post Inpatient/ED visit) call.   Richerd Fish, RN, BSN, CCM [facility], [facility] RN Care Manager Direct Dial: [phone]        "

## 2024-04-13 ENCOUNTER — Telehealth: Payer: Self-pay

## 2024-04-13 NOTE — Transitions of Care (Post Inpatient/ED Visit) (Signed)
 Care Management  Transitions of Care Program Transitions of Care Post-discharge week 2  04/13/2024 Name: Elmyra Banwart MRN: [id number] DOB: 01-29-1976  Subjective: Lavona Norsworthy is a 50 y.o. year old female who is a primary care patient of Fisher, Nancyann BRAVO, MD. The Care Management team was unable to reach the patient by phone to assess and address transitions of care needs.   Plan: Additional outreach attempts will be made to reach the patient enrolled in the [facility] Program (Post Inpatient/ED Visit).  Richerd Fish, RN, BSN, CCM [facility], [facility] RN Care Manager Direct Dial: [phone]

## 2024-04-14 ENCOUNTER — Telehealth: Payer: Self-pay

## 2024-04-14 NOTE — Transitions of Care (Post Inpatient/ED Visit) (Signed)
 Care Management  Transitions of Care Program Transitions of Care Post-discharge week 2  04/14/2024 Name: Ismahan Lippman MRN: [id number] DOB: 1975/10/15  Subjective: Pihu Basil is a 49 y.o. year old female who is a primary care patient of Fisher, Nancyann BRAVO, MD. The Care Management team was unable to reach the patient by phone to assess and address transitions of care needs.   Plan: No further outreach attempts will be made at this time.  We have been unable to reach the patient.  Richerd Fish, RN, BSN, CCM [facility], [facility] RN Care Manager Direct Dial: [phone]
